# Patient Record
Sex: Female | Born: 2000 | Race: Black or African American | Hispanic: No | Marital: Single | Smoking: Never smoker
Health system: Southern US, Community
[De-identification: ages and names within clinical notes are randomized; demographics above are authoritative.]

## PROBLEM LIST (undated history)

## (undated) DIAGNOSIS — D649 Anemia, unspecified: Secondary | ICD-10-CM

## (undated) DIAGNOSIS — N390 Urinary tract infection, site not specified: Secondary | ICD-10-CM

## (undated) DIAGNOSIS — J45909 Unspecified asthma, uncomplicated: Secondary | ICD-10-CM

## (undated) DIAGNOSIS — F319 Bipolar disorder, unspecified: Secondary | ICD-10-CM

## (undated) HISTORY — DX: Urinary tract infection, site not specified: N39.0

## (undated) HISTORY — PX: FETAL AMNIOTIC BAND RELEASE: SHX1600

## (undated) HISTORY — DX: Unspecified asthma, uncomplicated: J45.909

---
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
## 2018-02-05 DIAGNOSIS — F3181 Bipolar II disorder: Secondary | ICD-10-CM | POA: Diagnosis not present

## 2018-02-12 DIAGNOSIS — F3181 Bipolar II disorder: Secondary | ICD-10-CM | POA: Diagnosis not present

## 2018-02-26 DIAGNOSIS — F3181 Bipolar II disorder: Secondary | ICD-10-CM | POA: Diagnosis not present

## 2018-03-05 DIAGNOSIS — J3081 Allergic rhinitis due to animal (cat) (dog) hair and dander: Secondary | ICD-10-CM | POA: Diagnosis not present

## 2018-03-05 DIAGNOSIS — J45990 Exercise induced bronchospasm: Secondary | ICD-10-CM | POA: Diagnosis not present

## 2018-03-05 DIAGNOSIS — J301 Allergic rhinitis due to pollen: Secondary | ICD-10-CM | POA: Diagnosis not present

## 2018-03-05 DIAGNOSIS — J3089 Other allergic rhinitis: Secondary | ICD-10-CM | POA: Diagnosis not present

## 2018-03-05 DIAGNOSIS — F3181 Bipolar II disorder: Secondary | ICD-10-CM | POA: Diagnosis not present

## 2018-03-18 DIAGNOSIS — Z113 Encounter for screening for infections with a predominantly sexual mode of transmission: Secondary | ICD-10-CM | POA: Diagnosis not present

## 2018-03-26 DIAGNOSIS — F3181 Bipolar II disorder: Secondary | ICD-10-CM | POA: Diagnosis not present

## 2018-04-24 DIAGNOSIS — F319 Bipolar disorder, unspecified: Secondary | ICD-10-CM | POA: Diagnosis not present

## 2018-05-20 ENCOUNTER — Ambulatory Visit (HOSPITAL_COMMUNITY)
Admission: EM | Admit: 2018-05-20 | Discharge: 2018-05-20 | Disposition: A | Discharge status: Home / Self Care | Payer: Medicaid Other | Attending: Family Medicine | Admitting: Family Medicine

## 2018-05-20 ENCOUNTER — Encounter (HOSPITAL_COMMUNITY): Payer: Self-pay | Admitting: Emergency Medicine

## 2018-05-20 DIAGNOSIS — R1032 Left lower quadrant pain: Secondary | ICD-10-CM | POA: Diagnosis not present

## 2018-05-20 DIAGNOSIS — N898 Other specified noninflammatory disorders of vagina: Secondary | ICD-10-CM | POA: Diagnosis not present

## 2018-05-20 DIAGNOSIS — R1031 Right lower quadrant pain: Secondary | ICD-10-CM | POA: Insufficient documentation

## 2018-05-20 DIAGNOSIS — R1084 Generalized abdominal pain: Secondary | ICD-10-CM | POA: Diagnosis present

## 2018-05-20 HISTORY — DX: Bipolar disorder, unspecified: F31.9

## 2018-05-20 LAB — POCT URINALYSIS DIP (DEVICE)
GLUCOSE, UA: NEGATIVE mg/dL
KETONES UR: 15 mg/dL — AB
Nitrite: NEGATIVE
PROTEIN: 30 mg/dL — AB
Urobilinogen, UA: 0.2 mg/dL (ref 0.0–1.0)
pH: 6 (ref 5.0–8.0)

## 2018-05-20 LAB — POCT PREGNANCY, URINE: PREG TEST UR: NEGATIVE

## 2018-05-20 MED ORDER — POLYETHYLENE GLYCOL 3350 17 G PO PACK: 17.0000 g | PACK | Freq: Every day | ORAL | 0 refills | Status: DC

## 2018-05-20 MED ORDER — METRONIDAZOLE 500 MG PO TABS: 500.0000 mg | ORAL_TABLET | Freq: Two times a day (BID) | ORAL | 0 refills | Status: DC

## 2018-05-20 NOTE — ED Provider Notes (Signed)
[facility]    CSN: [id number] Arrival date & time: 05/20/18  1857     History   Chief Complaint Chief Complaint  Patient presents with  . Abdominal Pain    HPI Rose Schwartz is a 17 y.o. female.   17 year old female comes in for few day history of generalized abdominal cramping and vaginal spotting.  States abdominal cramping is worse to the lower abdomen, was constant but waxing and waning in intensity.  However, abdominal pain has now resolved in the past hour.  Denies nausea/vomiting.  Denies fever, chills, night sweats.  Denies urinary symptoms such as frequency, dysuria, hematuria.  Has had some vaginal spotting, vaginal discharge with odor without itching.  She is sexually active with 2 female partners, no condom use.  No birth control use.  Last bowel movement today with hard stools.     Past Medical History:  Diagnosis Date  . Bipolar 1 disorder (HCC)     There are no active problems to display for this patient.   History reviewed. No pertinent surgical history.  OB History   None      Home Medications    Prior to Admission medications   Medication Sig Start Date End Date Taking? Authorizing Provider  metroNIDAZOLE (FLAGYL) 500 MG tablet Take 1 tablet (500 mg total) by mouth 2 (two) times daily. 05/20/18   Cathie HoopsYu, Lanaiya Lantry V, PA-C  polyethylene glycol (MIRALAX) packet Take 17 g by mouth daily. 05/20/18   Belinda FisherYu, Azaiah Mello V, PA-C    Family History No family history on file.  Social History Social History   Tobacco Use  . Smoking status: Never Smoker  . Smokeless tobacco: Never Used  Substance Use Topics  . Alcohol use: No  . Drug use: No     Allergies   Other   Review of Systems Review of Systems  Reason unable to perform ROS: See HPI as above.     Physical Exam Triage Vital Signs ED Triage Vitals [05/20/18 1918]  Enc Vitals Group     BP 113/76     Pulse Rate 88     Resp 16     Temp 98.3 F (36.8 C)     Temp src      SpO2 95  %     Weight      Height      Head Circumference      Peak Flow      Pain Score 1     Pain Loc      Pain Edu?      Excl. in GC?    No data found.  Updated Vital Signs BP 113/76   Pulse 88   Temp 98.3 F (36.8 C)   Resp 16   LMP 05/06/2018   SpO2 95%   Physical Exam  Constitutional: She is oriented to person, place, and time. She appears well-developed and well-nourished. No distress.  HENT:  Head: Normocephalic and atraumatic.  Eyes: Pupils are equal, round, and reactive to light. Conjunctivae are normal.  Cardiovascular: Normal rate, regular rhythm and normal heart sounds. Exam reveals no gallop and no friction rub.  No murmur heard. Pulmonary/Chest: Effort normal and breath sounds normal. She has no wheezes. She has no rales.  Abdominal: Soft. Bowel sounds are normal. She exhibits no mass. There is no tenderness. There is no rebound, no guarding and no CVA tenderness.  Neurological: She is alert and oriented to person, place, and time.  Skin: Skin is  warm and dry.  Psychiatric: She has a normal mood and affect. Her behavior is normal. Judgment normal.     UC Treatments / Results  Labs (all labs ordered are listed, but only abnormal results are displayed) Labs Reviewed  POCT URINALYSIS DIP (DEVICE) - Abnormal; Notable for the following components:      Result Value   Bilirubin Urine SMALL (*)    Ketones, ur 15 (*)    Hgb urine dipstick TRACE (*)    Protein, ur 30 (*)    Leukocytes, UA SMALL (*)    All other components within normal limits  URINE CULTURE  POCT PREGNANCY, URINE  CERVICOVAGINAL ANCILLARY ONLY    EKG None  Radiology No results found.  Procedures Procedures (including critical care time)  Medications Ordered in UC Medications - No data to display  Initial Impression / Assessment and Plan / UC Course  I have reviewed the triage vital signs and the nursing notes.  Pertinent labs & imaging results that were available during my care of the  patient were reviewed by me and considered in my medical decision making (see chart for details).    We will treat empirically for bacterial vaginitis with Flagyl.  Patient currently with abdominal pain, but given history, will treat for constipation.  Cytology sent.  Return precautions given.  Patient expresses understanding and agrees to plan.  Final Clinical Impressions(s) / UC Diagnoses   Final diagnoses:  Bilateral lower abdominal pain  Vaginal discharge    ED Prescriptions    Medication Sig Dispense Auth. Provider   metroNIDAZOLE (FLAGYL) 500 MG tablet Take 1 tablet (500 mg total) by mouth 2 (two) times daily. 14 tablet Netra Postlethwait V, PA-C   polyethylene glycol (MIRALAX) packet Take 17 g by mouth daily. [address] each Threasa Alpha, PA-C 05/20/18 1956

## 2018-05-20 NOTE — Discharge Instructions (Signed)
No alarming signs on exam. Start flagyl for possible bacterial vaginitis causing vaginal spotting. Start miralax for the next 7-14 days to help with constipation. Keep hydrated, your urine should be clear to pale yellow in color. Cytology sent, you will be contacted with any positive results that requires further treatment. Refrain from sexual activity and alcohol use for the next 7 days. If experiencing worsening abdominal pain, unable to jump/walk due to pain, nausea/vomiting, go to the emergency department for further evaluation needed.

## 2018-05-20 NOTE — ED Triage Notes (Signed)
Pt c/o generalized abdominal cramping, states shes noticed some spotting but shes not due for her period yet.

## 2018-05-21 LAB — CERVICOVAGINAL ANCILLARY ONLY
Bacterial vaginitis: POSITIVE — AB
Candida vaginitis: NEGATIVE
Chlamydia: POSITIVE — AB
Neisseria Gonorrhea: NEGATIVE
TRICH (WINDOWPATH): NEGATIVE

## 2018-05-22 ENCOUNTER — Telehealth (HOSPITAL_COMMUNITY): Payer: Self-pay | Admitting: Emergency Medicine

## 2018-05-22 MED ORDER — AZITHROMYCIN 250 MG PO TABS: 1000.0000 mg | ORAL_TABLET | Freq: Once | ORAL | 0 refills | Status: AC

## 2018-05-22 NOTE — Telephone Encounter (Signed)
Bacterial Vaginosis test is positive.  Prescription for metronidazole was given at the urgent care visit.   Chlamydia is positive.  Rx po zithromax 1g #1 dose no refills was sent to the pharmacy of record.  Pt educated to please refrain from sexual intercourse for 7 days to give the medicine time to work, sexual partners need to be notified and tested/treated.  Condoms may reduce risk of reinfection.  Recheck or followup with PCP for further evaluation if symptoms are not improving.   [facility] notified.  All patients questions answered. Pt had not started the flagyl yet, will start today.   Urine culture still pending.

## 2018-05-24 LAB — URINE CULTURE: Culture: 30000 — AB

## 2018-05-25 ENCOUNTER — Telehealth (HOSPITAL_COMMUNITY): Payer: Self-pay | Admitting: Emergency Medicine

## 2018-05-25 MED ORDER — NITROFURANTOIN MONOHYD MACRO 100 MG PO CAPS: 100.0000 mg | ORAL_CAPSULE | Freq: Two times a day (BID) | ORAL | 0 refills | Status: DC

## 2018-05-25 NOTE — Telephone Encounter (Signed)
Urine culture was positive for STAPHYLOCOCCUS SPECIES (COAGULASE NEGATIVE). Prescription for Nitrofurantion 100mg  PO BID x5 days sent to pharmacy of choice. Pt called and made aware. Pt educated to follow up if symptoms are not improving. Verbalized understanding.

## 2018-06-28 DIAGNOSIS — N898 Other specified noninflammatory disorders of vagina: Secondary | ICD-10-CM | POA: Diagnosis not present

## 2018-10-21 ENCOUNTER — Encounter (HOSPITAL_COMMUNITY): Payer: Self-pay

## 2018-10-21 ENCOUNTER — Ambulatory Visit (HOSPITAL_COMMUNITY)
Admission: EM | Admit: 2018-10-21 | Discharge: 2018-10-21 | Disposition: A | Discharge status: Home / Self Care | Payer: Medicaid Other | Attending: Family Medicine | Admitting: Family Medicine

## 2018-10-21 ENCOUNTER — Other Ambulatory Visit: Payer: Self-pay

## 2018-10-21 DIAGNOSIS — N3001 Acute cystitis with hematuria: Secondary | ICD-10-CM | POA: Insufficient documentation

## 2018-10-21 DIAGNOSIS — R109 Unspecified abdominal pain: Secondary | ICD-10-CM | POA: Diagnosis not present

## 2018-10-21 LAB — POCT URINALYSIS DIP (DEVICE)
Bilirubin Urine: NEGATIVE
Glucose, UA: NEGATIVE mg/dL
Ketones, ur: NEGATIVE mg/dL
Nitrite: POSITIVE — AB
Protein, ur: 30 mg/dL — AB
Specific Gravity, Urine: 1.025 (ref 1.005–1.030)
Urobilinogen, UA: 1 mg/dL (ref 0.0–1.0)
pH: 6.5 (ref 5.0–8.0)

## 2018-10-21 LAB — POCT PREGNANCY, URINE: Preg Test, Ur: NEGATIVE

## 2018-10-21 MED ORDER — IBUPROFEN 800 MG PO TABS: 800.0000 mg | ORAL_TABLET | Freq: Three times a day (TID) | ORAL | 0 refills | Status: DC

## 2018-10-21 MED ORDER — CEPHALEXIN 500 MG PO CAPS: 500.0000 mg | ORAL_CAPSULE | Freq: Two times a day (BID) | ORAL | 0 refills | Status: DC

## 2018-10-21 NOTE — ED Triage Notes (Signed)
Pt presents with sharp flank to lower back pain since yesterday.

## 2018-10-23 ENCOUNTER — Telehealth (HOSPITAL_COMMUNITY): Payer: Self-pay | Admitting: Emergency Medicine

## 2018-10-23 NOTE — Telephone Encounter (Signed)
Attempted to reach patient. No answer at this time.   

## 2018-10-24 LAB — URINE CULTURE: Culture: >=100000 — AB

## 2018-10-27 NOTE — ED Provider Notes (Signed)
[facility]    ASSESSMENT & PLAN:  1. Acute right flank pain   2. Acute cystitis with hematuria    Meds ordered this encounter  Medications  . cephALEXin (KEFLEX) 500 MG capsule    Sig: Take 1 capsule (500 mg total) by mouth 2 (two) times daily.    Dispense:  10 capsule    Refill:  0  . ibuprofen (ADVIL,MOTRIN) 800 MG tablet    Sig: Take 1 tablet (800 mg total) by mouth 3 (three) times daily with meals.    Dispense:  21 tablet    Refill:  0   No signs of pyelonephritis. Discussed. Urine culture sent. Will notify patient when results available. Will follow up with her PCP or here if not showing improvement over the next 48 hours, sooner if needed.  Outlined signs and symptoms indicating need for more acute intervention. Patient verbalized understanding. After Visit Summary given.  SUBJECTIVE:  Rose Schwartz is a 18 y.o. female who complains of "sharp" R flank pain and lower back; abrupt onset; yesterday. Questions mild urinary frequency without dysuria. Without associated fever, chills, genitourinary discharge or gross bleeding. Hematuria: not present. Normal PO intake. Without specific abdominal pain. No self treatment reported. Ambulatory without difficulty. No specific aggravating or alleviating factors reported.  LMP: Patient's last menstrual period was 09/29/2018.  ROS: As in HPI. All other systems negative.   OBJECTIVE:  Vitals:   10/21/18 1850  BP: 109/75  Pulse: 95  Resp: 18  Temp: 98.7 F (37.1 C)  TempSrc: Oral  SpO2: 99%   General appearance: alert; no distress HENT: oropharynx: moist Lungs: unlabored respirations Abdomen: soft, non-tender; bowel sounds normal; no masses or organomegaly; no guarding or rebound tenderness Back: mild R CVA tenderness; no midline tenderness; FROM at waist Extremities: no edema; symmetrical with no gross deformities Skin: warm and dry Neurologic: normal gait Psychological: alert and cooperative;  normal mood and affect  Labs Reviewed  URINE CULTURE  POCT URINALYSIS DIP (DEVICE) - Abnormal; Notable for the following components:   Hgb urine dipstick MODERATE (*)    Protein, ur 30 (*)    Nitrite POSITIVE (*)    Leukocytes,Ua TRACE (*)    All other components within normal limits  POC URINE PREG, ED  POCT PREGNANCY, URINE    Allergies  Allergen Reactions  . Other     Tree nuts    Past Medical History:  Diagnosis Date  . Bipolar 1 disorder (HCC)    Social History   Socioeconomic History  . Marital status: Single    Spouse name: Not on file  . Number of children: Not on file  . Years of education: Not on file  . Highest education level: Not on file  Occupational History  . Not on file  Social Needs  . Financial resource strain: Not on file  . Food insecurity:    Worry: Not on file    Inability: Not on file  . Transportation needs:    Medical: Not on file    Non-medical: Not on file  Tobacco Use  . Smoking status: Never Smoker  . Smokeless tobacco: Never Used  Substance and Sexual Activity  . Alcohol use: No  . Drug use: No  . Sexual activity: Not on file  Lifestyle  . Physical activity:    Days per week: Not on file    Minutes per session: Not on file  . Stress: Not on file  Relationships  . Social connections:  Talks on phone: Not on file    Gets together: Not on file    Attends religious service: Not on file    Active member of club or organization: Not on file    Attends meetings of clubs or organizations: Not on file    Relationship status: Not on file  . Intimate partner violence:    Fear of current or ex partner: Not on file    Emotionally abused: Not on file    Physically abused: Not on file    Forced sexual activity: Not on file  Other Topics Concern  . Not on file  Social History Narrative  . Not on file   History reviewed. No pertinent family history.     Mardella Layman, MD 11/04/18 [phone]

## 2018-12-18 ENCOUNTER — Encounter (HOSPITAL_COMMUNITY): Payer: Self-pay | Admitting: Emergency Medicine

## 2018-12-18 ENCOUNTER — Ambulatory Visit (HOSPITAL_COMMUNITY)
Admission: EM | Admit: 2018-12-18 | Discharge: 2018-12-18 | Disposition: A | Discharge status: Home / Self Care | Payer: Medicaid Other | Attending: Family Medicine | Admitting: Family Medicine

## 2018-12-18 ENCOUNTER — Other Ambulatory Visit: Payer: Self-pay

## 2018-12-18 DIAGNOSIS — R0789 Other chest pain: Secondary | ICD-10-CM | POA: Diagnosis not present

## 2018-12-18 MED ORDER — ALBUTEROL SULFATE HFA 108 (90 BASE) MCG/ACT IN AERS: 2.0000 | INHALATION_SPRAY | RESPIRATORY_TRACT | 1 refills | Status: AC | PRN

## 2018-12-18 NOTE — ED Triage Notes (Signed)
Pt here with CP worse with inspiration

## 2018-12-18 NOTE — Discharge Instructions (Addendum)
Your symptoms are most consistent with reactive airways, or asthma.  If the symptoms return, try to use the inhaler.  If the inhaler fails to clear the symptoms promptly, you can return here for further evaluation.  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

## 2018-12-18 NOTE — ED Provider Notes (Addendum)
[facility]    CSN: [id number] Arrival date & time: 12/18/18  1856     History   Chief Complaint Chief Complaint  Patient presents with  . Chest Pain    HPI Rose Schwartz is a 18 y.o. female.   18 [facility] here for evaluation of chest pain.  She developed chest tightness this afternoon about 3 hours PTA.  The symptoms have resolved.  She had these symptoms a couple weeks ago and they resolved in a couple hours.  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  Patient has a h/o asthma but did not use her inhaler at the onset of symptoms.  No cough or shortness of breath, no fever or loss of sense of smell, no GI symptoms.       Past Medical History:  Diagnosis Date  . Bipolar 1 disorder (HCC)     There are no active problems to display for this patient.   History reviewed. No pertinent surgical history.  OB History   No obstetric history on file.      Home Medications    Prior to Admission medications   Medication Sig Start Date End Date Taking? Authorizing Provider  albuterol (VENTOLIN HFA) 108 (90 Base) MCG/ACT inhaler Inhale 2 puffs into the lungs every 4 (four) hours as needed for wheezing or shortness of breath (cough, shortness of breath or wheezing.). 12/18/18   Elvina SidleLauenstein, Leocadio Heal, MD  ibuprofen (ADVIL,MOTRIN) 800 MG tablet Take 1 tablet (800 mg total) by mouth 3 (three) times daily with meals. 10/21/18   Mardella LaymanHagler, Brian, MD  polyethylene glycol [facility](MIRALAX) packet Take 17 g by mouth daily. 05/20/18   Belinda FisherYu, Amy V, PA-C    Family History History reviewed. No pertinent family history.  Social History Social History   Tobacco Use  . Smoking status: Never Smoker  . Smokeless tobacco: Never Used  Substance Use Topics  . Alcohol use: No  . Drug use: No     Allergies   Other   Review of Systems Review of Systems  Respiratory: Positive for chest tightness.   All other systems reviewed and are  negative.    Physical Exam Triage Vital Signs ED Triage Vitals [12/18/18 1906]  Enc Vitals Group     BP 108/75     Pulse Rate 91     Resp 18     Temp 98.8 F (37.1 C)     Temp Source Oral     SpO2 98 %     Weight      Height      Head Circumference      Peak Flow      Pain Score 5     Pain Loc      Pain Edu?      Excl. in GC?    No data found.  Updated Vital Signs BP 108/75 (BP Location: Right Arm)   Pulse 91   Temp 98.8 F (37.1 C) (Oral)   Resp 18   SpO2 98%    Physical Exam Vitals signs and nursing note reviewed.  Constitutional:      Appearance: She is well-developed and normal weight.  Neck:     Musculoskeletal: Normal range of motion and neck supple.  Cardiovascular:     Heart sounds: Normal heart sounds.  Pulmonary:     Effort: Pulmonary effort is normal.     Breath sounds: Normal breath sounds.  Musculoskeletal: Normal range of motion.  Skin:    General: Skin is warm and dry.  Neurological:     General: No focal deficit present.     Mental Status: She is alert.  Psychiatric:        Mood and Affect: Mood normal.      UC Treatments / Results  Labs (all labs ordered are listed, but only abnormal results are displayed) Labs Reviewed - No data to display  EKG None  Radiology No results found.  Procedures Procedures (including critical care time)  Medications Ordered in UC Medications - No data to display  Initial Impression / Assessment and Plan / UC Course  I have reviewed the triage vital signs and the nursing notes.  Pertinent labs & imaging results that were available during my care of the patient were reviewed by me and considered in my medical decision making (see chart for details).    Final Clinical Impressions(s) / UC Diagnoses   Final diagnoses:  Chest tightness     Discharge Instructions     Your symptoms are most consistent with reactive airways, or asthma.  If the symptoms return, try to use the inhaler.  If the  inhaler fails to clear the symptoms promptly, you can return here for further evaluation.  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    ED Prescriptions    Medication Sig Dispense Auth. Provider   albuterol (VENTOLIN HFA) 108 (90 Base) MCG/ACT inhaler Inhale 2 puffs into the lungs every 4 (four) hours as needed for wheezing or shortness of breath (cough, shortness of breath or wheezing.). [place], MD     Controlled Substance Prescriptions [place] Controlled Substance Registry consulted? Not Applicable   Rose Haber, MD 12/18/18 Derl Barrow, MD 12/18/18 [phone]

## 2019-01-18 ENCOUNTER — Ambulatory Visit (HOSPITAL_COMMUNITY)
Admission: EM | Admit: 2019-01-18 | Discharge: 2019-01-18 | Disposition: A | Discharge status: Home / Self Care | Payer: Medicaid Other | Attending: Urgent Care | Admitting: Urgent Care

## 2019-01-18 ENCOUNTER — Encounter (HOSPITAL_COMMUNITY): Payer: Self-pay

## 2019-01-18 ENCOUNTER — Other Ambulatory Visit: Payer: Self-pay

## 2019-01-18 DIAGNOSIS — K529 Noninfective gastroenteritis and colitis, unspecified: Secondary | ICD-10-CM | POA: Diagnosis not present

## 2019-01-18 DIAGNOSIS — R110 Nausea: Secondary | ICD-10-CM

## 2019-01-18 DIAGNOSIS — R0789 Other chest pain: Secondary | ICD-10-CM

## 2019-01-18 DIAGNOSIS — R197 Diarrhea, unspecified: Secondary | ICD-10-CM

## 2019-01-18 MED ORDER — LOPERAMIDE HCL 2 MG PO CAPS: 2.0000 mg | ORAL_CAPSULE | Freq: Every day | ORAL | 0 refills | Status: DC | PRN

## 2019-01-18 MED ORDER — ONDANSETRON 8 MG PO TBDP: 8.0000 mg | ORAL_TABLET | Freq: Three times a day (TID) | ORAL | 0 refills | Status: DC | PRN

## 2019-01-18 NOTE — ED Triage Notes (Signed)
Pt presents with central chest pain with some nausea today.

## 2019-01-18 NOTE — ED Provider Notes (Addendum)
MRN: [id number] DOB: [birth year]  Subjective:   Rose Schwartz is a 18 y.o. female presenting for acute onset today of mild to moderate epigastric/lower mid chest pain with associated nausea, diarrhea.  Patient reports that she ate sushi over the weekend and thinks she had food poisoning.  Feels queasy on her stomach.  Has not tried medications for relief.  Denies taking chronic medications.    Allergies  Allergen Reactions  . Other     Tree nuts    Past Medical History:  Diagnosis Date  . Bipolar 1 disorder (HCC)      History reviewed. No pertinent surgical history.  Review of Systems  Constitutional: Negative for fever and malaise/fatigue.  HENT: Negative for congestion, ear pain, sinus pain and sore throat.   Eyes: Negative for blurred vision, double vision, discharge and redness.  Respiratory: Negative for cough, hemoptysis, shortness of breath and wheezing.   Cardiovascular: Positive for chest pain.  Gastrointestinal: Positive for abdominal pain, diarrhea and nausea. Negative for blood in stool, constipation and vomiting.  Genitourinary: Negative for dysuria, flank pain and hematuria.  Musculoskeletal: Negative for myalgias.  Skin: Negative for rash.  Neurological: Negative for dizziness, weakness and headaches.  Psychiatric/Behavioral: Negative for depression and substance abuse.    Objective:   Vitals: BP 115/78 (BP Location: Left Arm)   Pulse 90   Temp 99.4 F (37.4 C) (Oral)   Resp 17   LMP 12/22/2018   SpO2 99%   Physical Exam Constitutional:      General: She is not in acute distress.    Appearance: Normal appearance. She is well-developed and normal weight. She is not ill-appearing, toxic-appearing or diaphoretic.  HENT:     Head: Normocephalic and atraumatic.     Right Ear: External ear normal.     Left Ear: External ear normal.     Nose: Nose normal.     Mouth/Throat:     Mouth: Mucous membranes are moist.     Pharynx: Oropharynx is clear.   Eyes:     General: No scleral icterus.    Extraocular Movements: Extraocular movements intact.     Pupils: Pupils are equal, round, and reactive to light.  Cardiovascular:     Rate and Rhythm: Normal rate and regular rhythm.     Heart sounds: Normal heart sounds. No murmur. No friction rub. No gallop.   Pulmonary:     Effort: Pulmonary effort is normal. No respiratory distress.     Breath sounds: Normal breath sounds. No stridor. No wheezing, rhonchi or rales.  Abdominal:     General: Bowel sounds are normal. There is no distension.     Palpations: Abdomen is soft. There is no mass.     Tenderness: There is generalized abdominal tenderness and tenderness in the epigastric area and left upper quadrant. There is no right CVA tenderness, left CVA tenderness, guarding or rebound. Negative signs include Murphy's sign and McBurney's sign.  Skin:    General: Skin is warm and dry.     Coloration: Skin is not pale.     Findings: No rash.  Neurological:     General: No focal deficit present.     Mental Status: She is alert and oriented to person, place, and time.  Psychiatric:        Mood and Affect: Mood normal.        Behavior: Behavior normal.        Thought Content: Thought content normal.  Judgment: Judgment normal.     Assessment and Plan :   1. Gastroenteritis   2. Nausea   3. Diarrhea, unspecified type   4. Atypical chest pain     Patient has minimal risk factors for acute cardiovascular event and vital signs, physical exam findings are very reassuring.  We will manage for gastroenteritis with supportive care.  Recommended patient hydrate well, eat light meals and maintain electrolytes.  Will use Zofran and Imodium for nausea, vomiting and diarrhea. Counseled patient on potential for adverse effects with medications prescribed/recommended today, ER and return-to-clinic precautions discussed, patient verbalized understanding.    Jaynee Eagles, PA-C 01/19/19 1007

## 2019-01-25 ENCOUNTER — Telehealth (HOSPITAL_COMMUNITY): Payer: Self-pay | Admitting: Emergency Medicine

## 2019-01-25 NOTE — Telephone Encounter (Signed)
Pt called stating she never received a call to go get tested for covid. Per Jaynee Eagles PA, okay to order the labwork to send her to testing center. Pt given information, test ordered.

## 2019-03-05 DIAGNOSIS — J301 Allergic rhinitis due to pollen: Secondary | ICD-10-CM | POA: Diagnosis not present

## 2019-03-05 DIAGNOSIS — J3089 Other allergic rhinitis: Secondary | ICD-10-CM | POA: Diagnosis not present

## 2019-03-05 DIAGNOSIS — Z91018 Allergy to other foods: Secondary | ICD-10-CM | POA: Diagnosis not present

## 2019-03-05 DIAGNOSIS — J3081 Allergic rhinitis due to animal (cat) (dog) hair and dander: Secondary | ICD-10-CM | POA: Diagnosis not present

## 2019-05-07 ENCOUNTER — Other Ambulatory Visit: Payer: Self-pay

## 2019-05-07 DIAGNOSIS — Z20822 Contact with and (suspected) exposure to covid-19: Secondary | ICD-10-CM

## 2019-05-07 DIAGNOSIS — Z20828 Contact with and (suspected) exposure to other viral communicable diseases: Secondary | ICD-10-CM | POA: Diagnosis not present

## 2019-05-08 LAB — NOVEL CORONAVIRUS, NAA: SARS-CoV-2, NAA: NOT DETECTED

## 2019-05-24 ENCOUNTER — Inpatient Hospital Stay (HOSPITAL_COMMUNITY): Payer: Medicaid Other

## 2019-05-24 ENCOUNTER — Ambulatory Visit (HOSPITAL_COMMUNITY)
Admission: EM | Admit: 2019-05-24 | Discharge: 2019-05-24 | Disposition: A | Discharge status: Home / Self Care | Payer: Medicaid Other | Attending: Internal Medicine | Admitting: Internal Medicine

## 2019-05-24 ENCOUNTER — Encounter (HOSPITAL_COMMUNITY): Payer: Self-pay

## 2019-05-24 ENCOUNTER — Encounter (HOSPITAL_COMMUNITY): Payer: Self-pay | Admitting: *Deleted

## 2019-05-24 ENCOUNTER — Other Ambulatory Visit: Payer: Self-pay

## 2019-05-24 ENCOUNTER — Inpatient Hospital Stay (HOSPITAL_COMMUNITY)
Admission: AD | Admit: 2019-05-24 | Discharge: 2019-05-24 | Disposition: A | Discharge status: Home / Self Care | Payer: Medicaid Other | Attending: Obstetrics and Gynecology | Admitting: Obstetrics and Gynecology

## 2019-05-24 DIAGNOSIS — O99891 Other specified diseases and conditions complicating pregnancy: Secondary | ICD-10-CM | POA: Diagnosis not present

## 2019-05-24 DIAGNOSIS — R109 Unspecified abdominal pain: Secondary | ICD-10-CM

## 2019-05-24 DIAGNOSIS — O99341 Other mental disorders complicating pregnancy, first trimester: Secondary | ICD-10-CM | POA: Insufficient documentation

## 2019-05-24 DIAGNOSIS — Z91048 Other nonmedicinal substance allergy status: Secondary | ICD-10-CM | POA: Diagnosis not present

## 2019-05-24 DIAGNOSIS — Z3201 Encounter for pregnancy test, result positive: Secondary | ICD-10-CM

## 2019-05-24 DIAGNOSIS — O3680X0 Pregnancy with inconclusive fetal viability, not applicable or unspecified: Secondary | ICD-10-CM | POA: Diagnosis not present

## 2019-05-24 DIAGNOSIS — Z3A00 Weeks of gestation of pregnancy not specified: Secondary | ICD-10-CM | POA: Diagnosis not present

## 2019-05-24 DIAGNOSIS — O26891 Other specified pregnancy related conditions, first trimester: Secondary | ICD-10-CM

## 2019-05-24 DIAGNOSIS — F319 Bipolar disorder, unspecified: Secondary | ICD-10-CM | POA: Insufficient documentation

## 2019-05-24 DIAGNOSIS — R102 Pelvic and perineal pain: Secondary | ICD-10-CM | POA: Diagnosis not present

## 2019-05-24 DIAGNOSIS — O26899 Other specified pregnancy related conditions, unspecified trimester: Secondary | ICD-10-CM | POA: Diagnosis not present

## 2019-05-24 DIAGNOSIS — Z3A01 Less than 8 weeks gestation of pregnancy: Secondary | ICD-10-CM | POA: Diagnosis not present

## 2019-05-24 LAB — CBC
HCT: 38.8 % (ref 36.0–46.0)
Hemoglobin: 12.9 g/dL (ref 12.0–15.0)
MCH: 27.7 pg (ref 26.0–34.0)
MCHC: 33.2 g/dL (ref 30.0–36.0)
MCV: 83.4 fL (ref 80.0–100.0)
Platelets: 295 10*3/uL (ref 150–400)
RBC: 4.65 MIL/uL (ref 3.87–5.11)
RDW: 13 % (ref 11.5–15.5)
WBC: 9 10*3/uL (ref 4.0–10.5)
nRBC: 0 % (ref 0.0–0.2)

## 2019-05-24 LAB — POCT URINALYSIS DIP (DEVICE)
Bilirubin Urine: NEGATIVE
Glucose, UA: NEGATIVE mg/dL
Ketones, ur: 15 mg/dL — AB
Leukocytes,Ua: NEGATIVE
Nitrite: NEGATIVE
Protein, ur: NEGATIVE mg/dL
Specific Gravity, Urine: 1.025 (ref 1.005–1.030)
Urobilinogen, UA: 0.2 mg/dL (ref 0.0–1.0)
pH: 7 (ref 5.0–8.0)

## 2019-05-24 LAB — WET PREP, GENITAL
Sperm: NONE SEEN
Trich, Wet Prep: NONE SEEN
Yeast Wet Prep HPF POC: NONE SEEN

## 2019-05-24 LAB — POCT PREGNANCY, URINE: Preg Test, Ur: POSITIVE — AB

## 2019-05-24 LAB — ABO/RH: ABO/RH(D): O POS

## 2019-05-24 LAB — HCG, QUANTITATIVE, PREGNANCY: hCG, Beta Chain, Quant, S: 335 m[IU]/mL — ABNORMAL HIGH (ref <5)

## 2019-05-24 NOTE — MAU Provider Note (Signed)
History     CSN: [id number]  Arrival date and time: 05/24/19 2008   First Provider Initiated Contact with Patient 05/24/19 2251      Chief Complaint  Patient presents with  . Abdominal Pain   Rose Schwartz is a 18 y.o. G1P0 at [redacted]w[redacted]d who presents to MAU with complaints of abdominal pain. She reports abdominal pain started occurring 3 days ago, describes it as sharp cramping pain in the lower abdomen that radiates up to umbilicus, rates pain 4/10- has not taken any medication. She denies vaginal bleeding and discharge. She reports occasional nausea but denies vomiting.    OB History    Gravida  1   Para      Term      Preterm      AB      Living        SAB      TAB      Ectopic      Multiple      Live Births              Past Medical History:  Diagnosis Date  . Bipolar 1 disorder [facility])     Past Surgical History:  Procedure Laterality Date  . FETAL AMNIOTIC BAND RELEASE      History reviewed. No pertinent family history.  Social History   Tobacco Use  . Smoking status: Never Smoker  . Smokeless tobacco: Never Used  Substance Use Topics  . Alcohol use: No    Comment: SOCIAL  . Drug use: No    Allergies:  Allergies  Allergen Reactions  . Other     Tree nuts    Medications Prior to Admission  Medication Sig Dispense Refill Last Dose  . albuterol (VENTOLIN HFA) 108 (90 Base) MCG/ACT inhaler Inhale 2 puffs into the lungs every 4 (four) hours as needed for wheezing or shortness of breath (cough, shortness of breath or wheezing.). 1 Inhaler 1 Past Week at Unknown time  . ibuprofen (ADVIL,MOTRIN) 800 MG tablet Take 1 tablet (800 mg total) by mouth 3 (three) times daily with meals. 21 tablet 0 Past Month at Unknown time  . EPINEPHrine 0.3 mg/0.3 mL IJ SOAJ injection AS DIRECTED AS NEEDED FOR SYSTEMIC REACTIONS INJECTION     . loperamide (IMODIUM) 2 MG capsule Take 1 capsule (2 mg total) by mouth daily as needed for diarrhea or loose  stools. 10 capsule 0 More than a month at Unknown time  . ondansetron (ZOFRAN-ODT) 8 MG disintegrating tablet Take 1 tablet (8 mg total) by mouth every 8 (eight) hours as needed for nausea. 20 tablet 0 More than a month at Unknown time  . polyethylene glycol (MIRALAX) packet Take 17 g by mouth daily. 14 each 0 More than a month at Unknown time    Review of Systems  Constitutional: Negative.   Respiratory: Negative.   Cardiovascular: Negative.   Gastrointestinal: Positive for abdominal pain. Negative for constipation, diarrhea, nausea and vomiting.  Genitourinary: Negative.   Musculoskeletal: Negative.   Neurological: Negative.    Physical Exam   Last menstrual period 04/22/2019.  Physical Exam  Nursing note and vitals reviewed. Constitutional: She is oriented to person, place, and time. She appears well-developed and well-nourished. No distress.  Cardiovascular: Normal rate and regular rhythm.  Respiratory: Effort normal and breath sounds normal. No respiratory distress. She has no wheezes.  GI: Soft. She exhibits no distension. There is no abdominal tenderness. There is no rebound and no guarding.  Genitourinary:    Genitourinary Comments: Deferred, blind swabs collected   Neurological: She is alert and oriented to person, place, and time.  Psychiatric: She has a normal mood and affect. Her behavior is normal. Thought content normal.    MAU Course  Procedures  MDM Orders Placed This Encounter  Procedures  . Wet prep, genital  . US OB LESS THAN 14 WEEKS WITH OB TRANSVAGINAL  . CBC  . hCG, quantitative, pregnancy  . ABO/Rh   Labs and US report reviewed:  Results for orders placed or performed during the hospital encounter of 05/24/19 (from the past 24 hour(s))  CBC     Status: None   Collection Time: 05/24/19  8:36 PM  Result Value Ref Range   WBC 9.0 4.0 - 10.5 K/uL   RBC 4.65 3.87 - 5.11 MIL/uL   Hemoglobin 12.9 12.0 - 15.0 g/dL   HCT 16.138.8 09.636.0 - 04.546.0 %   MCV 83.4  80.0 - 100.0 fL   MCH 27.7 26.0 - 34.0 pg   MCHC 33.2 30.0 - 36.0 g/dL   RDW 40.913.0 81.111.5 - 91.415.5 %   Platelets 295 150 - 400 K/uL   nRBC 0.0 0.0 - 0.2 %  ABO/Rh     Status: None   Collection Time: 05/24/19  8:36 PM  Result Value Ref Range   ABO/RH(D) O POS    No rh immune globuloin      NOT A RH IMMUNE GLOBULIN CANDIDATE, PT RH POSITIVE Performed at [facility], [address] [place], [place] [address]   hCG, quantitative, pregnancy     Status: Abnormal   Collection Time: 05/24/19  8:36 PM  Result Value Ref Range   hCG, Beta Chain, Quant, S 335 (H) <5 mIU/mL  Wet prep, genital     Status: Abnormal   Collection Time: 05/24/19  9:00 PM  Result Value Ref Range   Yeast Wet Prep HPF POC NONE SEEN NONE SEEN   Trich, Wet Prep NONE SEEN NONE SEEN   Clue Cells Wet Prep HPF POC PRESENT (A) NONE SEEN   WBC, Wet Prep HPF POC MANY (A) NONE SEEN   Sperm NONE SEEN    [place] Ob Less Than 14 Weeks With Ob Transvaginal  Result Date: 05/24/2019 CLINICAL DATA:  Severe cramps EXAM: OBSTETRIC <14 WK US AND TRANSVAGINAL OB US TECHNIQUE: Both transabdominal and transvaginal ultrasound examinations were performed for complete evaluation of the gestation as well as the maternal uterus, adnexal regions, and pelvic cul-de-sac. Transvaginal technique was performed to assess early pregnancy. COMPARISON:  None. FINDINGS: Intrauterine gestational sac: None Yolk sac:  Not visualized Embryo:  Not visualized Cardiac Activity: Heart Rate:   bpm MSD:   mm    w     d CRL:    mm    w    d                  US EDC: Subchorionic hemorrhage:  N/A Maternal uterus/adnexae: Endometrium 11 mm in thickness. No adnexal mass or free fluid. IMPRESSION: No intrauterine pregnancy visualized. Differential considerations would include early intrauterine pregnancy too early to visualize, spontaneous abortion, or occult ectopic pregnancy. Recommend close clinical followup and serial quantitative beta HCGs and ultrasounds. Electronically  Signed   By: Charlett NoseKevin  Dover M.D.   On: 05/24/2019 22:15   Discussed results of US and labs with patient. Discussed pregnancy of unknown location based on US completed today and importance of follow up HCG then [place]  based on follow up HCG. Stat HCG scheduled for Thursday morning- patient aware of time and location. Ectopic precautions given to patient and discussed reasons to return to MAU.   Patient is unsure at this time of her options as this pregnancy was not planned. Discussed with patient that other options are only available if IUP- patient verbalizes understanding. Discussed with patient possibility of MTX if ectopic present.   Discussed reasons to return to MAU. Follow up as scheduled in the office on Thursday. Return to MAU as needed. Ectopic and miscarriage precautions. Pt stable at time of discharge.   Assessment and Plan   1. Pregnancy of unknown anatomic location   2. Abdominal pain during pregnancy in first trimester    Discharge home Follow up as scheduled in the office for stat HCG Return to MAU as needed for reasons discussed and/or emergencies  Ectopic and miscarriage precautions discussed   [place] for [facility] Follow up.   Specialty: Obstetrics and Gynecology Why: Follow up on Thursday for repeat labs  Contact information: [address], [place] [id number] [place] [id number] [phone]         Allergies as of 05/24/2019      Reactions   Other    Tree nuts      Medication List    TAKE these medications   albuterol 108 (90 Base) MCG/ACT inhaler Commonly known as: VENTOLIN HFA Inhale 2 puffs into the lungs every 4 (four) hours as needed for wheezing or shortness of breath (cough, shortness of breath or wheezing.).   EPINEPHrine 0.3 mg/0.3 mL Soaj injection Commonly known as: EPI-PEN AS DIRECTED AS NEEDED FOR SYSTEMIC REACTIONS INJECTION   ibuprofen 800 MG  tablet Commonly known as: ADVIL Take 1 tablet (800 mg total) by mouth 3 (three) times daily with meals.   loperamide 2 MG capsule Commonly known as: IMODIUM Take 1 capsule (2 mg total) by mouth daily as needed for diarrhea or loose stools.   ondansetron 8 MG disintegrating tablet Commonly known as: ZOFRAN-ODT Take 1 tablet (8 mg total) by mouth every 8 (eight) hours as needed for nausea.   polyethylene glycol 17 g packet Commonly known as: MiraLax Take 17 g by mouth daily.       Lajean Manes 05/24/2019, 11:01 PM

## 2019-05-24 NOTE — ED Provider Notes (Signed)
[place]    CSN: [id number] Arrival date & time: 05/24/19  1850      History   Chief Complaint Chief Complaint  Patient presents with  . Abdominal Pain  . Nausea  . Back Pain    HPI Rose Schwartz is a 18 y.o. female history of bipolar type I disorder presenting today for evaluation of abdominal pain and back pain.  Patient states that symptoms began approximately 3 days ago.  Pain is described as a cramping sensation.  She also notes that she has had some nausea, but denies vomiting.  Denies any change in bowel movements.  She has felt some tenderness within her left chest and feels like her breasts have been tender.  States that she feels as if she is having PMS symptoms felt worse.  Last menstrual cycle was around 10/15.  Is not on any form of birth control.  HPI  Past Medical History:  Diagnosis Date  . Bipolar 1 disorder ([place])     There are no active problems to display for this patient.   Past Surgical History:  Procedure Laterality Date  . FETAL AMNIOTIC BAND RELEASE      OB History    Gravida  1   Para      Term      Preterm      AB      Living        SAB      TAB      Ectopic      Multiple      Live Births               Home Medications    Prior to Admission medications   Medication Sig Start Date End Date Taking? Authorizing Provider  albuterol (VENTOLIN HFA) 108 (90 Base) MCG/ACT inhaler Inhale 2 puffs into the lungs every 4 (four) hours as needed for wheezing or shortness of breath (cough, shortness of breath or wheezing.). 12/18/18   Robyn Haber, MD  EPINEPHrine 0.3 mg/0.3 mL IJ SOAJ injection AS DIRECTED AS NEEDED FOR SYSTEMIC REACTIONS INJECTION 03/08/19   [provider]  ibuprofen (ADVIL,MOTRIN) 800 MG tablet Take 1 tablet (800 mg total) by mouth 3 (three) times daily with meals. 10/21/18   Vanessa Kick, MD  loperamide (IMODIUM) 2 MG capsule Take 1 capsule (2 mg total) by mouth daily as needed  for diarrhea or loose stools. 01/18/19   Jaynee Eagles, PA-C  ondansetron (ZOFRAN-ODT) 8 MG disintegrating tablet Take 1 tablet (8 mg total) by mouth every 8 (eight) hours as needed for nausea. 01/18/19   Jaynee Eagles, PA-C  polyethylene glycol [facility]) packet Take 17 g by mouth daily. 05/20/18   Ok Edwards, PA-C    Family History History reviewed. No pertinent family history.  Social History Social History   Tobacco Use  . Smoking status: Never Smoker  . Smokeless tobacco: Never Used  Substance Use Topics  . Alcohol use: No    Comment: SOCIAL  . Drug use: No     Allergies   Other   Review of Systems Review of Systems  Constitutional: Negative for fever.  Respiratory: Negative for shortness of breath.   Cardiovascular: Negative for chest pain.  Gastrointestinal: Positive for abdominal pain and nausea. Negative for diarrhea and vomiting.  Genitourinary: Negative for dysuria, flank pain, genital sores, hematuria, menstrual problem, vaginal bleeding, vaginal discharge and vaginal pain.  Musculoskeletal: Negative for back pain.  Skin: Negative for rash.  Neurological: Negative for dizziness, light-headedness and headaches.     Physical Exam Triage Vital Signs ED Triage Vitals  Enc Vitals Group     BP 05/24/19 1931 130/78     Pulse Rate 05/24/19 1931 85     Resp 05/24/19 1931 16     Temp 05/24/19 1931 98.6 F (37 C)     Temp Source 05/24/19 1931 Oral     SpO2 05/24/19 1931 98 %     Weight --      Height --      Head Circumference --      Peak Flow --      Pain Score 05/24/19 1928 8     Pain Loc --      Pain Edu? --      Excl. in GC? --    No data found.  Updated Vital Signs BP 130/78 (BP Location: Left Arm)   Pulse 85   Temp 98.6 F (37 C) (Oral)   Resp 16   LMP 04/22/2019   SpO2 98%   Visual Acuity Right Eye Distance:   Left Eye Distance:   Bilateral Distance:    Right Eye Near:   Left Eye Near:    Bilateral Near:     Physical Exam Vitals signs  and nursing note reviewed.  Constitutional:      General: She is not in acute distress.    Appearance: She is well-developed.  HENT:     Head: Normocephalic and atraumatic.  Eyes:     Conjunctiva/sclera: Conjunctivae normal.  Neck:     Musculoskeletal: Neck supple.  Cardiovascular:     Rate and Rhythm: Normal rate and regular rhythm.     Heart sounds: No murmur.  Pulmonary:     Effort: Pulmonary effort is normal. No respiratory distress.     Breath sounds: Normal breath sounds.  Abdominal:     Palpations: Abdomen is soft.     Tenderness: There is abdominal tenderness.     Comments: Abdomen soft, nondistended, generalized tenderness throughout abdomen which caused worsening cramping and pain after palpation, no focal tenderness, negative rebound  Skin:    General: Skin is warm and dry.  Neurological:     Mental Status: She is alert.      UC Treatments / Results  Labs (all labs ordered are listed, but only abnormal results are displayed) Labs Reviewed  POCT URINALYSIS DIP (DEVICE) - Abnormal; Notable for the following components:      Result Value   Ketones, ur 15 (*)    Hgb urine dipstick TRACE (*)    All other components within normal limits  POCT PREGNANCY, URINE - Abnormal; Notable for the following components:   Preg Test, Ur POSITIVE (*)    All other components within normal limits    EKG   Radiology No results found.  Procedures Procedures (including critical care time)  Medications Ordered in UC Medications - No data to display  Initial Impression / Assessment and Plan / UC Course  I have reviewed the triage vital signs and the nursing notes.  Pertinent labs & imaging results that were available during my care of the patient were reviewed by me and considered in my medical decision making (see chart for details).     Pregnancy test positive.  Discussed this with patient.  Given associated abdominal pain [facility] to  rule out ectopic.  Discussed other recommendations of initiating prenatal vitamin and following up with women's outpatient clinic  for further management of pregnancy after evaluation of abdominal pain tonight at MAU.  Patient verbalized understanding and verbalized that she plans to go to [facility].  Stable on discharge, sent independently. Final Clinical Impressions(s) / UC Diagnoses   Final diagnoses:  Abdominal pain, unspecified abdominal location     Discharge Instructions     Pregnancy test positive Please follow up at [facility] hospital with abdominal pain    ED Prescriptions    None     PDMP not reviewed this encounter.   Lew Dawes, [place] 05/24/19 2108

## 2019-05-24 NOTE — ED Triage Notes (Signed)
Pt states having abdominal pain and back pain x 3 days. Pt states having nausea x 2 days

## 2019-05-24 NOTE — MAU Note (Signed)
PT SAYS  HAS ABD PAIN -  STARTED  SAT.  THEN WENT TO [place] TONIGHT . PT DID HPT  - POSITIVE. NO BC. LAST SEX- WED. NO BLEEDING.

## 2019-05-24 NOTE — Discharge Instructions (Signed)
Pregnancy test positive Please follow up at [facility] hospital with abdominal pain

## 2019-05-25 LAB — GC/CHLAMYDIA PROBE AMP (~~LOC~~) NOT AT ARMC
Chlamydia: NEGATIVE
Comment: NEGATIVE
Comment: NORMAL
Neisseria Gonorrhea: NEGATIVE

## 2019-05-26 ENCOUNTER — Telehealth: Payer: Self-pay | Admitting: Family Medicine

## 2019-05-26 NOTE — Telephone Encounter (Signed)
Attempted to call patient about her appointment on 11/19 @ 8:00. No answer left voicemail instructing patient to wear a face mask for the entire appointment and no visitors are allowed during the visit. Patient instructed not to attend the appointment if she was any symptoms. Symptom list and office number left.

## 2019-05-27 ENCOUNTER — Ambulatory Visit (INDEPENDENT_AMBULATORY_CARE_PROVIDER_SITE_OTHER): Payer: Medicaid Other | Admitting: General Practice

## 2019-05-27 ENCOUNTER — Other Ambulatory Visit: Payer: Self-pay

## 2019-05-27 DIAGNOSIS — O3680X0 Pregnancy with inconclusive fetal viability, not applicable or unspecified: Secondary | ICD-10-CM | POA: Diagnosis not present

## 2019-05-27 LAB — BETA HCG QUANT (REF LAB): hCG Quant: 1307 m[IU]/mL

## 2019-05-27 NOTE — Progress Notes (Signed)
Patient presents to office today for stat bhcg following recent MAU visit on 11/16. Patient reports improvement in pain since then, states it feels like mild menstrual cramps & rates pain at a 4. Denies bleeding. Discussed with patient we are monitoring your bhcg levels today, results will be reviewed with a provider, & we will call you in approximately 2 hours with results/updated plan of care. Patient verbalized understanding & provided call back number [phone].  Reviewed with Dr Kennon Rounds who finds appropriate rise in bhcg levels, patient should have follow up ultrasound in 2 weeks. Scheduled u/s 12/3 @ 2pm.   Called patient, no answer- left message to call us back for results.  Called patient back & informed her of results and ultrasound appt. Ectopic precautions reviewed. Patient verbalized understanding & had no questions.  Koren Bound RN BSN 05/27/19

## 2019-05-27 NOTE — Progress Notes (Signed)
Patient seen and assessed by nursing staff.  Agree with documentation and plan.  

## 2019-06-10 ENCOUNTER — Other Ambulatory Visit: Payer: Self-pay

## 2019-06-10 ENCOUNTER — Encounter: Payer: Self-pay | Admitting: Obstetrics & Gynecology

## 2019-06-10 ENCOUNTER — Ambulatory Visit (HOSPITAL_COMMUNITY)
Admission: RE | Admit: 2019-06-10 | Discharge: 2019-06-10 | Disposition: A | Discharge status: Home / Self Care | Payer: Medicaid Other | Source: Ambulatory Visit | Attending: Family Medicine | Admitting: Family Medicine

## 2019-06-10 ENCOUNTER — Ambulatory Visit (INDEPENDENT_AMBULATORY_CARE_PROVIDER_SITE_OTHER): Payer: Medicaid Other

## 2019-06-10 DIAGNOSIS — O3680X0 Pregnancy with inconclusive fetal viability, not applicable or unspecified: Secondary | ICD-10-CM | POA: Insufficient documentation

## 2019-06-10 DIAGNOSIS — O3680X1 Pregnancy with inconclusive fetal viability, fetus 1: Secondary | ICD-10-CM | POA: Diagnosis not present

## 2019-06-10 DIAGNOSIS — Z3A01 Less than 8 weeks gestation of pregnancy: Secondary | ICD-10-CM | POA: Diagnosis not present

## 2019-06-10 MED ORDER — PREPLUS 27-1 MG PO TABS: 1.0000 | ORAL_TABLET | Freq: Every day | ORAL | 6 refills | Status: DC

## 2019-06-10 NOTE — Progress Notes (Signed)
Patient seen and assessed by nursing staff during this encounter. I have reviewed the chart and agree with the documentation and plan.  Verita Schneiders, MD 06/10/2019 4:24 PM

## 2019-06-10 NOTE — Progress Notes (Signed)
Pt here today for OB US results for pregnancy of unknown location.  Notified Dr. Harolyn Rutherford pt's [place] results who recommends pt to start prenatal care and start PNV.  Pt informed that she has a viable pregnancy with EDD 02/05/20, 5w 5d today, and FHR 121 bpm.  Pt denies any pain or bleeding.  Medications and allergies reviewed.  Pt request to have an Rx for PNV even though is taking gummies.  I explained to the pt that her insurance does not cover the gummies only the tablets.  [place] office to provide proof of pregnancy letter to start OB care.    Mel Almond, RN 06/10/19

## 2019-06-22 ENCOUNTER — Ambulatory Visit (HOSPITAL_COMMUNITY): Payer: Medicaid Other

## 2019-07-04 ENCOUNTER — Inpatient Hospital Stay (HOSPITAL_COMMUNITY)
Admission: AD | Admit: 2019-07-04 | Discharge: 2019-07-04 | Disposition: A | Discharge status: Home / Self Care | Payer: Medicaid Other | Attending: Family Medicine | Admitting: Family Medicine

## 2019-07-04 ENCOUNTER — Other Ambulatory Visit: Payer: Self-pay

## 2019-07-04 DIAGNOSIS — O99341 Other mental disorders complicating pregnancy, first trimester: Secondary | ICD-10-CM | POA: Diagnosis not present

## 2019-07-04 DIAGNOSIS — Z20828 Contact with and (suspected) exposure to other viral communicable diseases: Secondary | ICD-10-CM | POA: Diagnosis not present

## 2019-07-04 DIAGNOSIS — Z3A01 Less than 8 weeks gestation of pregnancy: Secondary | ICD-10-CM | POA: Insufficient documentation

## 2019-07-04 DIAGNOSIS — O26891 Other specified pregnancy related conditions, first trimester: Secondary | ICD-10-CM

## 2019-07-04 DIAGNOSIS — R0602 Shortness of breath: Secondary | ICD-10-CM

## 2019-07-04 DIAGNOSIS — Z3A10 10 weeks gestation of pregnancy: Secondary | ICD-10-CM

## 2019-07-04 DIAGNOSIS — R05 Cough: Secondary | ICD-10-CM

## 2019-07-04 DIAGNOSIS — J4520 Mild intermittent asthma, uncomplicated: Secondary | ICD-10-CM

## 2019-07-04 DIAGNOSIS — F319 Bipolar disorder, unspecified: Secondary | ICD-10-CM | POA: Diagnosis not present

## 2019-07-04 DIAGNOSIS — R000 Tachycardia, unspecified: Secondary | ICD-10-CM | POA: Insufficient documentation

## 2019-07-04 DIAGNOSIS — O99511 Diseases of the respiratory system complicating pregnancy, first trimester: Secondary | ICD-10-CM | POA: Diagnosis not present

## 2019-07-04 DIAGNOSIS — R059 Cough, unspecified: Secondary | ICD-10-CM

## 2019-07-04 LAB — SARS CORONAVIRUS 2 (TAT 6-24 HRS): SARS Coronavirus 2: NEGATIVE

## 2019-07-04 NOTE — Discharge Instructions (Signed)
Please go home and use your albuterol inhaler. You may use every 4-6 hours as needed. If shortness of breath worsens, please return to ER for evaluation. Please quarantine until COVID results return. Call one of our offices to make an initial OB appt to establish care.   [facility] for [place] for [place] @ [facility]   Phone: [phone]  [facility]   Phone: [place] [facility]  [facility]       Phone: [place] [facility] [place]     Phone: [place] for [place] @ [facility]   Phone: [place] for [place] @ [facility]  Phone: [place] for [place] @ [place])  Phone: [phone]

## 2019-07-04 NOTE — MAU Note (Signed)
Rose Schwartz is a 18 y.o. at [redacted]w[redacted]d here in MAU reporting:  +cough. Producing white clear mucus. +congestion +shortness of breath. Has neem discussing this with her doctor. They talked about exercised induced asthma. Was told to keep her inhaler with her. Denies fever or chills. States she has not been around anyone sick. Denies having traveled in the last 14 days. Onset of complaint: 2 weeks Pain score: denies Denies vaginal bleeding. Denies vaginal discharge. Vitals:   07/04/19 1653  BP: 123/81  Pulse: (!) 115  Resp: 16  Temp: 98.2 F (36.8 C)  SpO2: 99%    FHT: 174 via doppler

## 2019-07-04 NOTE — MAU Provider Note (Signed)
Chief Complaint: Cough, Nasal Congestion, and Shortness of Breath   First Provider Initiated Contact with Patient 07/04/19 1703      SUBJECTIVE HPI: Rose Schwartz is a 18 y.o. G1P0 at [redacted]w[redacted]d by early ultrasound who presents to maternity admissions reporting cough and SOB. Patient reports that for the past 2 weeks she has had intermittent cough and SOB with some loss of taste and smell. She feels congested. Denies fever, sore throat. Patient with hx of asthma and reports she does not have her inhaler with her all the time so she does not use it frequently; has not used for at least 2 weeks. Denies any OB complaints.  She denies vaginal bleeding, vaginal itching/burning, urinary symptoms, h/a, dizziness, n/v, or fever/chills.    Past Medical History:  Diagnosis Date  . Bipolar 1 disorder [facility])    Past Surgical History:  Procedure Laterality Date  . FETAL AMNIOTIC BAND RELEASE     Social History   Socioeconomic History  . Marital status: Single    Spouse name: Not on file  . Number of children: Not on file  . Years of education: Not on file  . Highest education level: Not on file  Occupational History  . Not on file  Tobacco Use  . Smoking status: Never Smoker  . Smokeless tobacco: Never Used  Substance and Sexual Activity  . Alcohol use: No    Comment: SOCIAL  . Drug use: No  . Sexual activity: Not on file  Other Topics Concern  . Not on file  Social History Narrative  . Not on file   Social Determinants of Health   Financial Resource Strain:   . Difficulty of Paying Living Expenses: Not on file  Food Insecurity:   . Worried About Programme researcher, broadcasting/film/video in the Last Year: Not on file  . Ran Out of Food in the Last Year: Not on file  Transportation Needs:   . Lack of Transportation (Medical): Not on file  . Lack of Transportation (Non-Medical): Not on file  Physical Activity:   . Days of Exercise per Week: Not on file  . Minutes of Exercise per Session: Not on  file  Stress:   . Feeling of Stress : Not on file  Social Connections:   . Frequency of Communication with Friends and Family: Not on file  . Frequency of Social Gatherings with Friends and Family: Not on file  . Attends Religious Services: Not on file  . Active Member of Clubs or Organizations: Not on file  . Attends Banker Meetings: Not on file  . Marital Status: Not on file  Intimate Partner Violence:   . Fear of Current or Ex-Partner: Not on file  . Emotionally Abused: Not on file  . Physically Abused: Not on file  . Sexually Abused: Not on file   No current facility-administered medications on file prior to encounter.   Current Outpatient Medications on File Prior to Encounter  Medication Sig Dispense Refill  . albuterol (VENTOLIN HFA) 108 (90 Base) MCG/ACT inhaler Inhale 2 puffs into the lungs every 4 (four) hours as needed for wheezing or shortness of breath (cough, shortness of breath or wheezing.). 1 Inhaler 1  . EPINEPHrine 0.3 mg/0.3 mL IJ SOAJ injection AS DIRECTED AS NEEDED FOR SYSTEMIC REACTIONS INJECTION    . ondansetron (ZOFRAN-ODT) 8 MG disintegrating tablet Take 1 tablet (8 mg total) by mouth every 8 (eight) hours as needed for nausea. (Patient not taking: Reported on  06/10/2019) 20 tablet 0  . Prenatal MV-Min-FA-Omega-3 (PRENATAL GUMMIES/DHA & FA) 0.4-32.5 MG CHEW Chew 2 each by mouth.    . Prenatal Vit-Fe Fumarate-FA (PREPLUS) 27-1 MG TABS Take 1 tablet by mouth daily. 30 tablet 6   Allergies  Allergen Reactions  . Other     Tree nuts    ROS:  Review of Systems All other systems negative unless noted above in HPI.   I have reviewed patient's Past Medical Hx, Surgical Hx, Family Hx, Social Hx, medications and allergies.   Physical Exam   Patient Vitals for the past 24 hrs:  BP Temp Temp src Pulse Resp SpO2  07/04/19 1700 -- -- -- (!) 109 -- 98 %  07/04/19 1653 123/81 98.2 F (36.8 C) Oral (!) 115 16 99 %   Constitutional: Well-developed,  well-nourished female in no acute distress.  Cardiovascular: mild tachycardia  Respiratory: normal effort, mild inspiratory wheezes bilaterally, good air movement throughout  GI: Abd soft, non-tender.  MS: Extremities nontender, no edema, normal ROM Neurologic: Alert and oriented x 4.  PELVIC EXAM: Deferred  FHT 174 by doppler  LAB RESULTS No results found for this or any previous visit (from the past 24 hour(s)).  --/--/O POS (11/16 2036)  IMAGING US OB Transvaginal  Result Date: 06/10/2019 CLINICAL DATA:  First trimester pregnancy, pregnancy of unknown anatomic location, confirm location, viability and dating EXAM: TRANSVAGINAL OB ULTRASOUND TECHNIQUE: Transvaginal ultrasound was performed for complete evaluation of the gestation as well as the maternal uterus, adnexal regions, and pelvic cul-de-sac. COMPARISON:  05/24/2019 FINDINGS: Intrauterine gestational sac: Present, single Yolk sac:  Present Embryo:  Present Cardiac Activity: Present Heart Rate: 121 bpm CRL:   2.9 mm   5 w 5 d                  [place] EDC: 02/05/2020 Subchorionic hemorrhage:  None visualized. Maternal uterus/adnexae: RIGHT ovary normal size and morphology, 4.4 x 1.6 x 1.6 cm. LEFT ovary normal size and morphology, 2.9 x 1.5 x 2.0 cm. Trace free pelvic fluid. No adnexal masses. IMPRESSION: Single live intrauterine gestation at 5 weeks 5 days EGA by crown-rump length. No acute abnormalities. Electronically Signed   By: Lavonia Dana M.D.   On: 06/10/2019 15:21    MAU Management/MDM: Orders Placed This Encounter  Procedures  . SARS CORONAVIRUS 2 (TAT 6-24 HRS) Nasopharyngeal Nasopharyngeal Swab  . Discharge patient Discharge disposition: 01-Home or Self Care; Discharge patient date: 07/04/2019    No orders of the defined types were placed in this encounter.   Patient presented with cough and SOB in setting of known asthma. Lungs with bilateral inspiratory wheezing but not audible without ascultation. Mild tachycardia noted  otherwise vitals stable. Patient tolerating PO well. She preferred to discharge and use home albuterol. COVID swab obtained and patient advised to quarantine until results return. [place] office number given for patient to schedule initial OB visit. Advised copious fluids and rest. Pt discharged with strict return precautions.  ASSESSMENT 1. Cough   2. Shortness of breath   3. Tachycardia   4. Mild intermittent asthma without complication     PLAN Discharge home Allergies as of 07/04/2019      Reactions   Other    Tree nuts      Medication List    STOP taking these medications   ondansetron 8 MG disintegrating tablet Commonly known as: ZOFRAN-ODT     TAKE these medications   albuterol 108 (90 Base) MCG/ACT inhaler Commonly  known as: VENTOLIN HFA Inhale 2 puffs into the lungs every 4 (four) hours as needed for wheezing or shortness of breath (cough, shortness of breath or wheezing.).   EPINEPHrine 0.3 mg/0.3 mL Soaj injection Commonly known as: EPI-PEN AS DIRECTED AS NEEDED FOR SYSTEMIC REACTIONS INJECTION   Prenatal Gummies/DHA & FA 0.4-32.5 MG Chew Chew 2 each by mouth.   PrePLUS 27-1 MG Tabs Take 1 tablet by mouth daily.      Follow-up Information    [facility]. Schedule an appointment as soon as possible for a visit in 2 week(s).   Specialty: Obstetrics and Gynecology Contact information: [address] [id number] mc [place] [phone]          Jerilynn Birkenheadhelsea Denilson Salminen, MD [facility] Family [facility] Fellow, [facility], [facility] 07/04/2019  5:24 PM

## 2019-07-09 NOTE — L&D Delivery Note (Signed)
OB/GYN Faculty Practice Delivery Note  Rose Schwartz is a 19 y.o. G1P0 s/p vag del at [redacted]w[redacted]d. She was admitted for PROM.   ROM: 15h 30m with clear fluid GBS Status: neg Maximum Maternal Temperature: 98.6  Labor Progress: Marland Kitchen [name] was admitted with PROM and desired expectant management with the hopes of having a waterbirth. In the afternoon she received a single dose of cytotec at 2cm, and then proceeded to labor spontaneously. At 9cm she got in the tub and progressed to a vag del an hour later.  Delivery Date/Time: January 17, 2020 at 2238 Delivery: In room while patient was involuntarily pushing in the tub. Head delivered OA. No nuchal cord present. Shoulder and body delivered in usual fashion. Infant with spontaneous cry, placed on mother's abdomen, dried and stimulated. Cord clamped x 2 after 3-minute delay, and cut by Greggory Stallion (FOB). Pt assisted to the bed from the tub. Cord blood drawn. Placenta delivered spontaneously with gentle cord traction. Fundus firm with massage and Pitocin. Labia, perineum, vagina, and cervix inspected and found to be intact.   Placenta: spont, intact Complications: none Lacerations: none EBL: 200cc Analgesia: Fentanyl x 2 while laboring  Postpartum Planning [x]  message to sent to schedule follow-up   Infant: female  APGARs 7/9  3340g (7lb 5.8oz)  9/9, CNM  01/17/2020 11:26 PM

## 2019-07-14 ENCOUNTER — Ambulatory Visit (INDEPENDENT_AMBULATORY_CARE_PROVIDER_SITE_OTHER): Payer: Medicaid Other | Admitting: *Deleted

## 2019-07-14 ENCOUNTER — Other Ambulatory Visit: Payer: Self-pay

## 2019-07-14 DIAGNOSIS — Z3490 Encounter for supervision of normal pregnancy, unspecified, unspecified trimester: Secondary | ICD-10-CM

## 2019-07-14 DIAGNOSIS — F319 Bipolar disorder, unspecified: Secondary | ICD-10-CM | POA: Insufficient documentation

## 2019-07-14 DIAGNOSIS — J4520 Mild intermittent asthma, uncomplicated: Secondary | ICD-10-CM

## 2019-07-14 DIAGNOSIS — J45909 Unspecified asthma, uncomplicated: Secondary | ICD-10-CM | POA: Insufficient documentation

## 2019-07-14 MED ORDER — BLOOD PRESSURE KIT DEVI: 1.0000 | 0 refills | Status: DC | PRN

## 2019-07-14 NOTE — Patient Instructions (Signed)
At [facility], we work as an integrated team, providing care to address both physical and emotional health. Your medical provider may refer you to see our Behavioral Health Clinician (BHC) on the same day you see your medical provider, as availability permits.  Our BHC is available to all patients, visits generally last between 20-30 minutes, but can be longer or shorter, depending on patient need. The BHC offers help with stress management, coping with symptoms of depression and anxiety, major life changes , sleep issues, changing risky behavior, grief and loss, life stress, working on personal life goals, and  behavioral health issues, as these all affect your overall health and wellness.  The BHC is NOT available for the following: court-ordered evaluations, specialty assessments (custody or disability), letters to employers, or obtaining certification for an emotional support animal. The BHC does not provide long-term therapeutic services. You have the right to refuse integrated behavioral health services, or to reschedule to see the BHC at a later date.  Exception: If you are having thoughts of suicide, we require that you either see the BHC for further assessment, or contract for safety with your medical provider prior to checking out.  Confidentiality exception: If it is suspected that a child or disabled adult is being abused or neglected, we are required by law to report that to either Child Protective Services or Adult Protective Services.  If you have a diagnosis of Bipolar affective disorder, Schizophrenia, or recurrent Major depressive disorder, we will recommend that you establish care with a psychiatrist, as these are lifelong, chronic conditions, and we want your overall emotional health and medications to be more closely monitored. If you anticipate needing extended maternity leave due to mental illness, it it recommended that you find a psychiatrist as soon as possible.  Neither the medical provider, nor the BHC, can recommend an extended maternity leave for mental health issues. Your medical provider or BHC may refer you to a therapist for ongoing, traditional therapy, or to a psychiatrist, for medication management, if it would benefit your overall health. Depending on your insurance, you may have a copay to see the BHC. If you are uninsured, it is recommended that you apply for financial assistance. (Forms may be requested at the front desk for in-person visits, via MyChart, or request a form during a virtual visit).  If you see the BHC more than 6 times, you will have to complete a comprehensive clinical assessment interview with the BHC to resume integrated services.  Any questions?   [place] [employer]  Department of Social Services[place] [address], [place], [place] [address] [phone]) [phone]   or  www.[place].gov/[place]/human-services/social-services **SNAP/EBT/ Other nutritional benefits  [place] DHHS-Public Health-WIC [address], [place], [place] [address] [phone]) [phone]  or  https://[place].gov/[place]/human-services/health-department **WIC for  women who are pregnant and postpartum, infants and children up to 5 years old  [employer] [employer] [facility] [address], [place], [place] [address] [phone]) [phone]   or   www.theblessedtable.org  **Food pantry  [employer] [address], [place], [place] [address] [phone]   or   https://brotherkolbes.godaddysites.com  **Emergency food and prepared meals  [place] [place] [employer] [facility] [address], [place], [place] [address] [phone]) [phone]   or   www.cedargrovetop.us **Food pantry  [facility] [employer] [facility] [address], [place], [place] [address] [phone]) [phone]   or   www.facebook.com/[facility]-116430931718202 **Food pantry  [facility]  Food Pantry [address],   [place], [place] [address] [phone]) [phone] **Food pantry  [facility] [address], [place], [place] [address] [phone]) [phone]   or   www.greensborourbanministry.org  **Food pantry and prepared meals  [facility] [address], [place], [place] [address] https://jfsgreensboro.org/  **Food pantry  [facility] [employer] [facility] [address], [place], [place] [address] [phone]) [phone]   or   www.lbcnow.org  **Food pantry  [employer] [facility] [address], [place], [place] [address] [phone]) [phone]   or   http://www.onestepfurther.com **Food pantry, nutrition education, gardening activities  [employer] [facility] [address], [place], [place] [address] [phone]) [phone] **Food pantry  [facility] [place] [address], [place], [place] [address] [phone]) [phone]   or   www.salvationarmyofgreensboro.org **Food pantry  [facility] [place] [address], [place], [place] [address] [phone]) [phone]   or   http://senior-resources-[place].org **Meals on Wheels Program  [facility] [address], , [place] [address] [phone]) [phone]   or   www.stmattchurch.com  **Food pantry  [facility] [facility] [address], , [place] [address] [phone]   or   vandaliapresbyterianchurch.org **Food pantry  [place] [employer]  [place] [employer] [facility] [address], [place], [place] [address] [phone]) [phone]   or   www.facebook.com/JulianUMC Food pantry  [employer] [employer] [facility] [address], [place], [place] [address] [phone]) [phone] **Food pantry  [place] [facility] [employer]   Department of Public Health[place] [address], [place], [place] [address] [phone]) [phone]   or   www.co.[place].us/ph/  [place] DHHS-Public Health-WIC ([place]) [address], [place], [place] [address] [phone]) [phone]   or   https://www.[place].gov/[place]/human-services/health-department **WIC for pregnant and postpartum women, infants and children up to 5 years old  [facility] [address], [place], [place] [address] [phone]) [phone] **Food pantry  [facility] [address], [place], [place] [address] [phone]   or   emerywoodbaptistchurch.com *Food pantry  [employer] [facility] [address], [place], [place] [address] [phone]) [phone]   or   www.fcch[place].org **Food pantry  [facility] [address], [place], [place] [address] [phone]) [phone]   or   www.helpinghandshp.org **Food pantry  [employer] [facility] [address], [place], [place] [address] [phone]) [phone]   or   www.facebook.com/KBCI1 **Food Pantry  [employer] [facility] [address], [place], [place] [address] [phone]) [phone]   or   www.abbottscreek.org **Food pantry  [employer] [facility] [place] [phone])[phone]   **Delivers meals  [facility] [address], [place], [place] [address] [phone]) [phone]   or   nbfgm.sundaystreamwebsites.com  **Food pantry  [facility] [place] [address], [place], [place] [address] [phone]) [phone]   or   www.odm-hp.org  **Food pantry  [facility] [address], [place], [place] [address] [phone]   or   R2live.tv **Food pantry  [facility] [address], [place], [place] [address] [phone]) [phone]   or   www.salvationarmycarolinas.org/[place] **Emergency food and pet food  [facility] [address], [place],  [address] [phone])[phone]   or   www.senioradults.org **Congregate and delivered meals to older adults  [employer] [facility] [place] [address], [place],   [address] [phone]) [phone]     or   www.unitedwayhp.org **Back Pack Program for elementary school students  [facility] [address], [place], [place] [address] [phone]) 888-6091   or   www.wardstreetcommunityresources.org **Food pantry  [facility] [address], [place], [place] [address] [phone]   or   www.ywcahp.com **Emergency food, nutrition classes, food budgeting   

## 2019-07-14 NOTE — Progress Notes (Signed)
I connected with  Rose Schwartz on 07/14/19 at  2:30 PM EST by telephone and verified that I am speaking with the correct person using two identifiers.   I discussed the limitations, risks, security and privacy concerns of performing an evaluation and management service by telephone and the availability of in person appointments. I also discussed with the patient that there may be a patient responsible charge related to this service. The patient expressed understanding and agreed to proceed. Explained I am completing her New OB Intake today. We discussed Her EDD and that it is based on  Early [place]. I reviewed her allergies, meds, OB History, Medical /Surgical history, and appropriate screenings. She does have some food insecurity issues. I informed her I put resources in her checkout note today. She did have + screen for depression.  I offered [facility] app[facility] with [facility] which she accepted. I explained registrars will schedule and notify her of appointment.  Patient and/or legal guardian verbally consented to [facility] services about presenting concerns and psychiatric consultation as appropriate.    I explained I will send her the Babyscripts app- app sent to her while on phone but she was unable to download. I explained we will help her with that at her new ob visit tomorrow.  I explained we will send a blood pressure cuff to [facility] that will fill that prescription and they  will call her to verify her information. I asked her to bring the blood pressure cuff with her to her next ob appointment so we can show her how to use it. Explained  then we will have her take her blood pressure weekly and enter into the app. Explained she will have some visits in office and some virtually. She already has Sports coach. I reviewed her new ob  appointment date/ time with her , our location and to wear mask, no visitors.  I explained she will have a pelvic exam, ob bloodwork, hemoglobin a1C, cbg  , genetic testing if desired,- she does want a panorama. I scheduled an Korea at 19 weeks and gave her the appointment. She voices understanding.  Brigida Scotti,RN 07/14/2019  2:30 PM

## 2019-07-15 ENCOUNTER — Ambulatory Visit (INDEPENDENT_AMBULATORY_CARE_PROVIDER_SITE_OTHER): Payer: Medicaid Other | Admitting: Obstetrics and Gynecology

## 2019-07-15 VITALS — BP 116/78 | HR 95 | Wt 136.0 lb

## 2019-07-15 DIAGNOSIS — Z3A10 10 weeks gestation of pregnancy: Secondary | ICD-10-CM | POA: Diagnosis not present

## 2019-07-15 DIAGNOSIS — Q740 Other congenital malformations of upper limb(s), including shoulder girdle: Secondary | ICD-10-CM | POA: Diagnosis not present

## 2019-07-15 DIAGNOSIS — Z23 Encounter for immunization: Secondary | ICD-10-CM

## 2019-07-15 DIAGNOSIS — Z3491 Encounter for supervision of normal pregnancy, unspecified, first trimester: Secondary | ICD-10-CM | POA: Diagnosis not present

## 2019-07-15 DIAGNOSIS — Z3490 Encounter for supervision of normal pregnancy, unspecified, unspecified trimester: Secondary | ICD-10-CM

## 2019-07-15 DIAGNOSIS — Z315 Encounter for genetic counseling: Secondary | ICD-10-CM | POA: Diagnosis not present

## 2019-07-15 DIAGNOSIS — Z3481 Encounter for supervision of other normal pregnancy, first trimester: Secondary | ICD-10-CM | POA: Diagnosis not present

## 2019-07-15 NOTE — Patient Instructions (Signed)

## 2019-07-15 NOTE — Progress Notes (Signed)
History:   Rose Schwartz is a 19 y.o. G1P0 at 38w5dby early ultrasound being seen today for her first obstetrical visit.  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 Patient does intend to breast feed. Pregnancy history fully reviewed. Unplanned pregnancy, unsure if she will keep the pregnancy.   Patient reports no complaints. HISTORY: OB History  Gravida Para Term Preterm AB Living  1 0 0 0 0 0  SAB TAB Ectopic Multiple Live Births  0 0 0 0 0    # Outcome Date GA Lbr Len/2nd Weight Sex Delivery Anes PTL Lv  1 Current             Last pap smear was done NA   Past Medical History:  Diagnosis Date  . Asthma   . Bipolar 1 disorder ([place]   . UTI (urinary tract infection)    Past Surgical History:  Procedure Laterality Date  . FETAL AMNIOTIC BAND RELEASE     Family History  Problem Relation Age of Onset  . Fibromyalgia Mother    Social History   Tobacco Use  . Smoking status: Never Smoker  . Smokeless tobacco: Never Used  Substance Use Topics  . Alcohol use: Not Currently    Comment: SOCIAL  . Drug use: Not Currently    Types: Marijuana    Comment: few years ago   Allergies  Allergen Reactions  . Other     Tree nuts   Current Outpatient Medications on File Prior to Visit  Medication Sig Dispense Refill  . albuterol (VENTOLIN HFA) 108 (90 Base) MCG/ACT inhaler Inhale 2 puffs into the lungs every 4 (four) hours as needed for wheezing or shortness of breath (cough, shortness of breath or wheezing.). 1 Inhaler 1  . Blood Pressure Monitoring (BLOOD PRESSURE KIT) DEVI 1 Device by Does not apply route as needed. 1 each 0  . EPINEPHrine 0.3 mg/0.3 mL IJ SOAJ injection AS DIRECTED AS NEEDED FOR SYSTEMIC REACTIONS INJECTION    . Prenatal MV-Min-FA-Omega-3 (PRENATAL GUMMIES/DHA & FA) 0.4-32.5 MG CHEW Chew 2 each by mouth.    . Prenatal Vit-Fe Fumarate-FA (PREPLUS) 27-1 MG TABS Take 1 tablet by mouth daily. 30 tablet 6   No  current facility-administered medications on file prior to visit.    Review of Systems Pertinent items noted in HPI and remainder of comprehensive ROS otherwise negative. Physical Exam:   Vitals:   07/15/19 0903  BP: 116/78  Pulse: 95  Weight: 136 lb (61.7 kg)   Fetal Heart Rate (bpm): 160 Uterus:     System: General: well-developed, well-nourished female in no acute distress   Skin: normal coloration and turgor, no rashes   Neurologic: oriented, normal, negative, normal mood   Extremities: normal strength, tone, and muscle mass, ROM of all joints is normal   HEENT PERRLA, extraocular movement intact and sclera clear, anicteric   Mouth/Teeth mucous membranes moist, pharynx normal without lesions and dental hygiene good   Neck supple and no masses   Cardiovascular: regular rate and rhythm   Respiratory:  no respiratory distress, normal breath sounds   Abdomen: soft, non-tender; bowel sounds normal; no masses,  no organomegaly    Assessment:    Pregnancy: G1P0 Patient Active Problem List   Diagnosis Date Noted  . 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 Supervision of low-risk pregnancy 07/14/2019  . Asthma   . Bipolar 1 disorder ([place]      Plan:  1. Encounter for supervision of low-risk pregnancy, antepartum  - Genetic Screening - Obstetric Panel, Including HIV - Hemoglobin A1c - Culture, OB Urine  2. 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 Continue prenatal vitamins. Genetic Screening discussed, NIPS: requested. Ultrasound discussed; fetal anatomic survey: ordered. Problem list reviewed and updated. The nature of [place] with multiple MDs and other Advanced Practice Providers was explained to patient; also emphasized that residents, students are part of our team. Routine obstetric precautions reviewed.    [place]asch, Artist Pais, [place] for [employer], [place]

## 2019-07-16 LAB — OBSTETRIC PANEL, INCLUDING HIV
Antibody Screen: NEGATIVE
Basophils Absolute: 0.1 10*3/uL (ref 0.0–0.2)
Basos: 1 %
EOS (ABSOLUTE): 0.4 10*3/uL (ref 0.0–0.4)
Eos: 5 %
HIV Screen 4th Generation wRfx: NONREACTIVE
Hematocrit: 35.9 % (ref 34.0–46.6)
Hemoglobin: 12.3 g/dL (ref 11.1–15.9)
Hepatitis B Surface Ag: NEGATIVE
Immature Grans (Abs): 0 10*3/uL (ref 0.0–0.1)
Immature Granulocytes: 0 %
Lymphocytes Absolute: 1.7 10*3/uL (ref 0.7–3.1)
Lymphs: 23 %
MCH: 29.4 pg (ref 26.6–33.0)
MCHC: 34.3 g/dL (ref 31.5–35.7)
MCV: 86 fL (ref 79–97)
Monocytes Absolute: 0.5 10*3/uL (ref 0.1–0.9)
Monocytes: 7 %
Neutrophils Absolute: 4.7 10*3/uL (ref 1.4–7.0)
Neutrophils: 64 %
Platelets: 264 10*3/uL (ref 150–450)
RBC: 4.19 x10E6/uL (ref 3.77–5.28)
RDW: 12.8 % (ref 11.7–15.4)
RPR Ser Ql: NONREACTIVE
Rh Factor: POSITIVE
Rubella Antibodies, IGG: 1 index (ref >0.99)
WBC: 7.4 10*3/uL (ref 3.4–10.8)

## 2019-07-16 LAB — HEMOGLOBIN A1C
Est. average glucose Bld gHb Est-mCnc: 105 mg/dL
Hgb A1c MFr Bld: 5.3 % (ref 4.8–5.6)

## 2019-07-17 LAB — URINE CULTURE, OB REFLEX

## 2019-07-17 LAB — CULTURE, OB URINE

## 2019-07-26 ENCOUNTER — Encounter: Payer: Self-pay | Admitting: *Deleted

## 2019-07-26 NOTE — BH Specialist Note (Signed)
Integrated Behavioral Health via Telemedicine Video Visit  07/26/2019 Rose Schwartz [id number]  Number of Integrated Behavioral Health visits: 1 Session Start time: 1:20  Session End time: 2:16 Total time: 14  Referring Provider: Venia Carbon, NP Type of Visit: Video Patient/Family location: Home [facility] Provider location: [facility] All persons participating in visit: [name] and [facility] Rose Schwartz    Confirmed patient's address: Yes  Confirmed patient's phone number: Yes  Any changes to demographics: No   Confirmed patient's insurance: Yes  Any changes to patient's insurance: No   Discussed confidentiality: Yes   I connected with Rose Schwartz  by a video enabled telemedicine application and verified that I am speaking with the correct person using two identifiers.     I discussed the limitations of evaluation and management by telemedicine and the availability of in person appointments.  I discussed that the purpose of this visit is to provide behavioral health care while limiting exposure to the novel coronavirus.   Discussed there is a possibility of technology failure and discussed alternative modes of communication if that failure occurs.  I discussed that engaging in this video visit, they consent to the provision of behavioral healthcare and the services will be billed under their insurance.  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 Pt was on Lithium and Abilify for a brief period of time, and stopped after having auditory hallucinations, uncertain if taking marijuana with Abilify  increased voices.  Pt is considering listening to more [employer], and will consider referral to psychiatry at a later date. Duration of problem: Current pregnancy; ongoing; Severity of problem: severe  STRENGTHS (Protective Factors/Coping Skills): Open to learn, hopeful outlook  GOALS ADDRESSED: Patient will: 1.  Reduce symptoms of: mood instability  2.  Increase knowledge and/or ability of: coping skills and healthy habits  3.  Demonstrate ability to: Increase healthy adjustment to current life circumstances  INTERVENTIONS: Interventions utilized:  Supportive Counseling and Psychoeducation and/or Health Education Standardized Assessments completed: Not given today  ASSESSMENT: Patient currently experiencing Bipolar 1 disorder and Psychosocial stress  Patient may benefit from psychoeducation and brief therapeutic interventions regarding coping with symptoms of mood instability and life stress .  PLAN: 1. Follow up with behavioral health clinician on : One week 2. Behavioral recommendations:  -Set timer on phone daily to write for 15 minutes daily about decision: free flowing thoughts -Listen to one [employer] podcast or Felicity Pellegrini Talk this week on topic of shame -Continue taking prenatal vitamin daily 3. Referral(s): Integrated [employer] (In Clinic)  I discussed the assessment and treatment plan with the patient and/or parent/guardian. They were provided an opportunity to ask questions and all were answered. They agreed with the plan and demonstrated an understanding of the instructions.   They were advised to call back or seek an in-person evaluation if the symptoms worsen or if the condition fails to improve as anticipated.  Rose Schwartz  Depression screen [facility] 2/9 07/14/2019  Decreased Interest 2  Down, Depressed, Hopeless 1  PHQ - 2 Score 3  Altered sleeping 1  Tired, decreased energy 3  Change in appetite 3  Feeling bad or  failure about yourself   1  Trouble concentrating 1  Moving slowly or fidgety/restless 0  Suicidal thoughts 0  PHQ-9 Score 12   GAD 7 : Generalized Anxiety Score 07/14/2019  Nervous, Anxious, on Edge 1  Control/stop worrying 1  Worry too much - different things 1  Trouble relaxing 1  Restless 1  Easily annoyed or irritable 1  Afraid - awful might happen 1  Total GAD 7 Score 7

## 2019-07-27 ENCOUNTER — Ambulatory Visit (INDEPENDENT_AMBULATORY_CARE_PROVIDER_SITE_OTHER): Payer: Medicaid Other | Admitting: Clinical

## 2019-07-27 ENCOUNTER — Other Ambulatory Visit: Payer: Self-pay

## 2019-07-27 DIAGNOSIS — F319 Bipolar disorder, unspecified: Secondary | ICD-10-CM

## 2019-07-27 DIAGNOSIS — O99341 Other mental disorders complicating pregnancy, first trimester: Secondary | ICD-10-CM

## 2019-07-27 DIAGNOSIS — Z658 Other specified problems related to psychosocial circumstances: Secondary | ICD-10-CM

## 2019-07-28 ENCOUNTER — Telehealth (INDEPENDENT_AMBULATORY_CARE_PROVIDER_SITE_OTHER): Payer: Medicaid Other | Admitting: Lactation Services

## 2019-07-28 DIAGNOSIS — Z3492 Encounter for supervision of normal pregnancy, unspecified, second trimester: Secondary | ICD-10-CM

## 2019-07-28 NOTE — Telephone Encounter (Signed)
Called pt to inform her of there Genetic Screening results. Pt informed that she is a carrier for Alpha Thalassemia. Pt informed to call [facility] at [phone] to schedule a Genetic Counseling session. Discussed they may want to have FOB tested for the same gene.  Pt voiced understanding.

## 2019-07-29 DIAGNOSIS — Z3400 Encounter for supervision of normal first pregnancy, unspecified trimester: Secondary | ICD-10-CM | POA: Diagnosis not present

## 2019-08-02 ENCOUNTER — Encounter: Payer: Self-pay | Admitting: *Deleted

## 2019-08-02 NOTE — BH Specialist Note (Signed)
Integrated Behavioral Health via Telemedicine Video Visit  08/02/2019 Rose Schwartz [id number]  Number of Integrated Behavioral Health visits: 2 Session Start time: 1:23  Session End time: 1:55 Total time: 32  Referring Provider: Venia Carbon, NP Type of Visit: Video Patient/Family location: Home [facility] Provider location: [facility] All persons participating in visit: [name] and Rose Schwartz    Confirmed patient's address: Yes  Confirmed patient's phone number: Yes  Any changes to demographics: No   Confirmed patient's insurance: Yes  Any changes to patient's insurance: No   Discussed confidentiality: At previous visit  I connected with Rose Schwartz  by a video enabled telemedicine application and verified that I am speaking with Rose correct person using two identifiers.     I discussed Rose limitations of evaluation and management by telemedicine and Rose availability of in person appointments.  I discussed that Rose purpose of this visit is to provide behavioral health care while limiting exposure to Rose novel coronavirus.   Discussed there is a possibility of technology failure and discussed alternative modes of communication if that failure occurs.  I discussed that engaging in this video visit, they consent to Rose provision of behavioral healthcare and Rose services will be billed under their insurance.  Patient and/or legal guardian expressed understanding and consented to video visit: Yes   PRESENTING CONCERNS: Patient and/or family reports Rose following symptoms/concerns: Pt states her primary concern today is interpersonal relationship issue with FOB's mother; says it helps to talk through her feelings today.  Duration of problem: Current pregnancy; Severity of problem: moderate  STRENGTHS (Protective Factors/Coping Skills): Hopeful outlook; open to implement self-coping strategies; FOB supportive  GOALS ADDRESSED: Patient  will: 1.  Reduce symptoms of: mood instability  2.  Increase knowledge and/or ability of: self-management skills  3.  Demonstrate ability to: Increase healthy adjustment to current life circumstances  INTERVENTIONS: Interventions utilized:  Brief CBT Standardized Assessments completed: Not given today  ASSESSMENT: Patient currently experiencing Bipolar 1 disorder and Psychosocial stress  Patient may benefit from continued psychoeducation and brief therapeutic interventions regarding coping with symptoms of mood instability and life stress .  PLAN: 1. Follow up with behavioral health clinician on : Two weeks 2. Behavioral recommendations:  -Begin writing 15 minutes per day about upcoming decision -Continue taking prenatal vitamin daily -Re-watch [employer] podcast, specifically on topic of shame -Consider referral to psychiatry at a future visit 3. Referral(s): Integrated [employer] (In Clinic)  I discussed Rose assessment and treatment plan with Rose patient and/or parent/guardian. They were provided an opportunity to ask questions and all were answered. They agreed with Rose plan and demonstrated an understanding of Rose instructions.   They were advised to call back or seek an in-person evaluation if Rose symptoms worsen or if Rose condition fails to improve as anticipated.  Rose Schwartz

## 2019-08-03 ENCOUNTER — Ambulatory Visit (INDEPENDENT_AMBULATORY_CARE_PROVIDER_SITE_OTHER): Payer: Medicaid Other | Admitting: Clinical

## 2019-08-03 DIAGNOSIS — Z658 Other specified problems related to psychosocial circumstances: Secondary | ICD-10-CM | POA: Diagnosis not present

## 2019-08-03 DIAGNOSIS — F319 Bipolar disorder, unspecified: Secondary | ICD-10-CM

## 2019-08-04 ENCOUNTER — Encounter: Payer: Self-pay | Admitting: Obstetrics and Gynecology

## 2019-08-04 DIAGNOSIS — D563 Thalassemia minor: Secondary | ICD-10-CM | POA: Insufficient documentation

## 2019-08-10 ENCOUNTER — Inpatient Hospital Stay (HOSPITAL_COMMUNITY)
Admission: AD | Admit: 2019-08-10 | Discharge: 2019-08-10 | Disposition: A | Discharge status: Home / Self Care | Payer: Medicaid Other | Attending: Family Medicine | Admitting: Family Medicine

## 2019-08-10 ENCOUNTER — Other Ambulatory Visit: Payer: Self-pay

## 2019-08-10 ENCOUNTER — Encounter (HOSPITAL_COMMUNITY): Payer: Self-pay | Admitting: Family Medicine

## 2019-08-10 DIAGNOSIS — F319 Bipolar disorder, unspecified: Secondary | ICD-10-CM | POA: Diagnosis not present

## 2019-08-10 DIAGNOSIS — S39012A Strain of muscle, fascia and tendon of lower back, initial encounter: Secondary | ICD-10-CM | POA: Diagnosis not present

## 2019-08-10 DIAGNOSIS — O99512 Diseases of the respiratory system complicating pregnancy, second trimester: Secondary | ICD-10-CM | POA: Insufficient documentation

## 2019-08-10 DIAGNOSIS — O99342 Other mental disorders complicating pregnancy, second trimester: Secondary | ICD-10-CM | POA: Diagnosis not present

## 2019-08-10 DIAGNOSIS — D563 Thalassemia minor: Secondary | ICD-10-CM

## 2019-08-10 DIAGNOSIS — J4520 Mild intermittent asthma, uncomplicated: Secondary | ICD-10-CM | POA: Diagnosis not present

## 2019-08-10 DIAGNOSIS — O99891 Other specified diseases and conditions complicating pregnancy: Secondary | ICD-10-CM | POA: Diagnosis not present

## 2019-08-10 DIAGNOSIS — M549 Dorsalgia, unspecified: Secondary | ICD-10-CM

## 2019-08-10 DIAGNOSIS — Z3A14 14 weeks gestation of pregnancy: Secondary | ICD-10-CM | POA: Insufficient documentation

## 2019-08-10 DIAGNOSIS — O26892 Other specified pregnancy related conditions, second trimester: Secondary | ICD-10-CM | POA: Diagnosis not present

## 2019-08-10 DIAGNOSIS — Z3492 Encounter for supervision of normal pregnancy, unspecified, second trimester: Secondary | ICD-10-CM

## 2019-08-10 DIAGNOSIS — M545 Low back pain: Secondary | ICD-10-CM | POA: Diagnosis present

## 2019-08-10 LAB — URINALYSIS, ROUTINE W REFLEX MICROSCOPIC
Bacteria, UA: NONE SEEN
Bilirubin Urine: NEGATIVE
Glucose, UA: NEGATIVE mg/dL
Hgb urine dipstick: NEGATIVE
Ketones, ur: 5 mg/dL — AB
Nitrite: NEGATIVE
Protein, ur: NEGATIVE mg/dL
Specific Gravity, Urine: 1.031 — ABNORMAL HIGH (ref 1.005–1.030)
pH: 5 (ref 5.0–8.0)

## 2019-08-10 LAB — WET PREP, GENITAL
Clue Cells Wet Prep HPF POC: NONE SEEN
Sperm: NONE SEEN
Trich, Wet Prep: NONE SEEN
Yeast Wet Prep HPF POC: NONE SEEN

## 2019-08-10 MED ORDER — CYCLOBENZAPRINE HCL 10 MG PO TABS: 10.0000 mg | ORAL_TABLET | Freq: Every evening | ORAL | 2 refills | Status: DC | PRN

## 2019-08-10 MED ORDER — CYCLOBENZAPRINE HCL 10 MG PO TABS
10.0000 mg | ORAL_TABLET | Freq: Once | ORAL | Status: AC
  Administered 2019-08-10: 10 mg via ORAL
  Filled 2019-08-10: qty 1

## 2019-08-10 NOTE — MAU Provider Note (Signed)
Chief Complaint: Back Pain   First Provider Initiated Contact with Patient 08/10/19 2112      SUBJECTIVE HPI: Rose Schwartz is a 19 y.o. G1P0 at 68w3dwho presents to maternity admissions reporting lower back pain. 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 Back pain is in low middle back. Pain is worse at night and she reports sometimes she will have a spasm and her back will hurt a lot when she tries to turn over in bed. Also worse when sitting on the ground with the children. Denies any pain radiating down her legs. Some nausea yesterday morning. Reports some watery discharge over the last several weeks that wets her underwear. Discharge has a bad odor. Mild pelvic pressure as well.  She denies vaginal bleeding, vaginal itching/burning, urinary symptoms, h/a, dizziness, nausea, or fever/chills.    Past Medical History:  Diagnosis Date  . Asthma   . [place]   . UTI (urinary tract infection)    Past Surgical History:  Procedure Laterality Date  . FETAL AMNIOTIC BAND RELEASE     Social History   Socioeconomic History  . Marital status: Single    Spouse name: Not on file  . Number of children: Not on file  . Years of education: Not on file  . Highest education level: Not on file  Occupational History  . Not on file  Tobacco Use  . Smoking status: Never Smoker  . Smokeless tobacco: Never Used  Substance and Sexual Activity  . Alcohol use: Not Currently    Comment: SOCIAL  . Drug use: Not Currently    Types: Marijuana    Comment: few years ago  . Sexual activity: Yes    Birth control/protection: None  Other Topics Concern  . Not on file  Social History Narrative  . Not on file   Social Determinants of Health   Financial Resource Strain:   . Difficulty of Paying Living Expenses: Not on file  Food Insecurity: Food Insecurity Present  . Worried About RCharity fundraiserin the Last Year: Sometimes true  . Ran Out of Food in  the Last Year: Sometimes true  Transportation Needs: No Transportation Needs  . Lack of Transportation (Medical): No  . Lack of Transportation (Non-Medical): No  Physical Activity:   . Days of Exercise per Week: Not on file  . Minutes of Exercise per Session: Not on file  Stress:   . Feeling of Stress : Not on file  Social Connections:   . Frequency of Communication with Friends and Family: Not on file  . Frequency of Social Gatherings with Friends and Family: Not on file  . Attends Religious Services: Not on file  . Active Member of Clubs or Organizations: Not on file  . Attends CArchivistMeetings: Not on file  . Marital Status: Not on file  Intimate Partner Violence:   . Fear of Current or Ex-Partner: Not on file  . Emotionally Abused: Not on file  . Physically Abused: Not on file  . Sexually Abused: Not on file   No current facility-administered medications on file prior to encounter.   Current Outpatient Medications on File Prior to Encounter  Medication Sig Dispense Refill  . albuterol (VENTOLIN HFA) 108 (90 Base) MCG/ACT inhaler Inhale 2 puffs into the lungs every 4 (four) hours as needed for wheezing or shortness of breath (cough, shortness of breath or wheezing.). 1 Inhaler 1  .  Blood Pressure Monitoring (BLOOD PRESSURE KIT) DEVI 1 Device by Does not apply route as needed. 1 each 0  . EPINEPHrine 0.3 mg/0.3 mL IJ SOAJ injection AS DIRECTED AS NEEDED FOR SYSTEMIC REACTIONS INJECTION    . Prenatal MV-Min-FA-Omega-3 (PRENATAL GUMMIES/DHA & FA) 0.4-32.5 MG CHEW Chew 2 each by mouth.    . Prenatal Vit-Fe Fumarate-FA (PREPLUS) 27-1 MG TABS Take 1 tablet by mouth daily. 30 tablet 6   Allergies  Allergen Reactions  . Other     Tree nuts    ROS:  Review of Systems All other systems negative unless noted above in HPI.   I have reviewed patient's Past Medical Hx, Surgical Hx, Family Hx, Social Hx, medications and allergies.   Physical Exam   Patient Vitals for  the past 24 hrs:  BP Temp Temp src Pulse Resp Height Weight  08/10/19 2042 121/69 99.1 F (37.3 C) Oral (!) 108 20 '5\' 1"'  (1.549 m) 59.7 kg   Constitutional: Well-developed, well-nourished female in no acute distress.  Cardiovascular: mild tachycardia  Respiratory: normal effort GI: Abd soft, non-tender.  MS: Extremities nontender, no edema, normal ROM. Bilateral lumbosacral tightness and tenderness to palpation.  Neurologic: Alert and oriented x 4.  GU: Neg CVAT. Bimanual exam: Cervix 0/long/high, firm, anterior, neg CMT, uterus nontender, nonenlarged, adnexa without tenderness, enlargement, or mass  FHT: 144 bpm  LAB RESULTS Results for orders placed or performed during the hospital encounter of 08/10/19 (from the past 24 hour(s))  Wet prep, genital     Status: Abnormal   Collection Time: 08/10/19  9:22 PM   Specimen: Genital  Result Value Ref Range   Yeast Wet Prep HPF POC NONE SEEN NONE SEEN   Trich, Wet Prep NONE SEEN NONE SEEN   Clue Cells Wet Prep HPF POC NONE SEEN NONE SEEN   WBC, Wet Prep HPF POC FEW (A) NONE SEEN   Sperm NONE SEEN   Urinalysis, Routine w reflex microscopic     Status: Abnormal   Collection Time: 08/10/19  9:27 PM  Result Value Ref Range   Color, Urine YELLOW YELLOW   APPearance HAZY (A) CLEAR   Specific Gravity, Urine 1.031 (H) 1.005 - 1.030   pH 5.0 5.0 - 8.0   Glucose, UA NEGATIVE NEGATIVE mg/dL   Hgb urine dipstick NEGATIVE NEGATIVE   Bilirubin Urine NEGATIVE NEGATIVE   Ketones, ur 5 (A) NEGATIVE mg/dL   Protein, ur NEGATIVE NEGATIVE mg/dL   Nitrite NEGATIVE NEGATIVE   Leukocytes,Ua MODERATE (A) NEGATIVE   RBC / HPF 0-5 0 - 5 RBC/hpf   WBC, UA 0-5 0 - 5 WBC/hpf   Bacteria, UA NONE SEEN NONE SEEN   Squamous Epithelial / LPF 0-5 0 - 5   Mucus PRESENT     O/Positive/-- (01/07 8563)  IMAGING No results found.  MAU Management/MDM: Orders Placed This Encounter  Procedures  . Wet prep, genital  . Culture, Urine  . Urinalysis, Routine w  reflex microscopic  . Discharge patient    Meds ordered this encounter  Medications  . cyclobenzaprine (FLEXERIL) tablet 10 mg  . cyclobenzaprine (FLEXERIL) 10 MG tablet    Sig: Take 1 tablet (10 mg total) by mouth at bedtime as needed for muscle spasms.    Dispense:  30 tablet    Refill:  2    Patient presented to MAU with lower back pain. By exam and history, likely lumbosacral strain. Flexeril given. Wet prep and GC/Chlamydia collected. Wet prep without infection. UA with moderate  leuks and culture ordered.  Advised back exercises/stretching, application of heat/ice and caution with straining when bending over.  Pt discharged with strict return precautions.  Future Appointments  Date Time Provider [place]  08/12/2019  1:15 PM Rasch, Artist Pais, NP [place]  08/17/2019  1:15 PM [place]  09/10/2019  8:00 AM WH-MFC [place] 3 WH-MFCUS MFC-US    ASSESSMENT 1. Lumbosacral strain, initial encounter   2. Alpha thalassemia silent carrier   3. Encounter for supervision of low-risk pregnancy in second trimester   4. Mild intermittent asthma without complication   5. Bipolar 1 disorder ([place])   6. Back pain affecting pregnancy in second trimester     PLAN Discharge home Allergies as of 08/10/2019      Reactions   Other    Tree nuts      Medication List    TAKE these medications   albuterol 108 (90 Base) MCG/ACT inhaler Commonly known as: VENTOLIN HFA Inhale 2 puffs into the lungs every 4 (four) hours as needed for wheezing or shortness of breath (cough, shortness of breath or wheezing.).   Blood Pressure Kit Devi 1 Device by Does not apply route as needed.   cyclobenzaprine 10 MG tablet Commonly known as: FLEXERIL Take 1 tablet (10 mg total) by mouth at bedtime as needed for muscle spasms.   EPINEPHrine 0.3 mg/0.3 mL Soaj injection Commonly known as: EPI-PEN AS DIRECTED AS NEEDED FOR SYSTEMIC REACTIONS INJECTION   Prenatal  Gummies/DHA & FA 0.4-32.5 MG Chew Chew 2 each by mouth.   PrePLUS 27-1 MG Tabs Take 1 tablet by mouth daily.      Follow-up [place] for [facility] Follow up.   Specialty: Obstetrics and Gynecology Contact information: [address] [place], [place] [id number] [place] [id number] [place], Egan Fellow, [facility] for [facility] [place] [facility] 08/10/2019  10:09 PM

## 2019-08-10 NOTE — MAU Note (Signed)
PT SAYS HER LOWER BACK HURTS X1 WEEK - NO MEDS.    [facility]-  WITH   CLINC.- ON Thursday 4TH.

## 2019-08-11 LAB — URINE CULTURE

## 2019-08-11 LAB — GC/CHLAMYDIA PROBE AMP (~~LOC~~) NOT AT ARMC
Chlamydia: NEGATIVE
Comment: NEGATIVE
Comment: NORMAL
Neisseria Gonorrhea: NEGATIVE

## 2019-08-12 ENCOUNTER — Encounter (INDEPENDENT_AMBULATORY_CARE_PROVIDER_SITE_OTHER): Payer: Medicaid Other | Admitting: Obstetrics and Gynecology

## 2019-08-12 ENCOUNTER — Encounter: Payer: Self-pay | Admitting: Obstetrics and Gynecology

## 2019-08-12 ENCOUNTER — Telehealth: Payer: Self-pay | Admitting: Obstetrics and Gynecology

## 2019-08-12 ENCOUNTER — Encounter: Payer: Self-pay | Admitting: General Practice

## 2019-08-12 DIAGNOSIS — Z3400 Encounter for supervision of normal first pregnancy, unspecified trimester: Secondary | ICD-10-CM | POA: Diagnosis not present

## 2019-08-12 NOTE — Progress Notes (Signed)
@  115pm No Answer LVM that I will call back in 5 mins and to be available by phone. @124pm  no answer LVM that I was trying to reach her and sorry we missed her and to call the office back to reschedule for a better time for her.

## 2019-08-12 NOTE — Telephone Encounter (Signed)
Attempted to contact patient to get her rescheduled for her missed ob appointment. No answer, left voicemail for patient to give the office a call back to be rescheduled. No show letter mailed 

## 2019-08-14 NOTE — Progress Notes (Signed)
Patient did not keep her appointment today.   Duane Lope, NP 08/14/2019 10:49 AM

## 2019-08-16 NOTE — BH Specialist Note (Signed)
Pt did not arrive to video visit and did not answer the phone ; Unable to leave voicemail as voicemail is full; left MyChart message for patient.    Integrated Behavioral Health via Telemedicine Video Visit  08/16/2019 MAIKAYLA BEGGS [id number]  Rae Lips

## 2019-08-17 ENCOUNTER — Ambulatory Visit: Payer: Medicaid Other | Admitting: Clinical

## 2019-08-17 DIAGNOSIS — Z5329 Procedure and treatment not carried out because of patient's decision for other reasons: Secondary | ICD-10-CM

## 2019-08-17 DIAGNOSIS — Z91199 Patient's noncompliance with other medical treatment and regimen due to unspecified reason: Secondary | ICD-10-CM

## 2019-08-24 ENCOUNTER — Other Ambulatory Visit: Payer: Self-pay

## 2019-08-24 ENCOUNTER — Encounter (HOSPITAL_COMMUNITY): Payer: Self-pay | Admitting: Obstetrics and Gynecology

## 2019-08-24 ENCOUNTER — Inpatient Hospital Stay (HOSPITAL_COMMUNITY)
Admission: AD | Admit: 2019-08-24 | Discharge: 2019-08-24 | Disposition: A | Discharge status: Home / Self Care | Payer: Medicaid Other | Attending: Obstetrics and Gynecology | Admitting: Obstetrics and Gynecology

## 2019-08-24 DIAGNOSIS — J45909 Unspecified asthma, uncomplicated: Secondary | ICD-10-CM | POA: Insufficient documentation

## 2019-08-24 DIAGNOSIS — O26892 Other specified pregnancy related conditions, second trimester: Secondary | ICD-10-CM | POA: Diagnosis not present

## 2019-08-24 DIAGNOSIS — F319 Bipolar disorder, unspecified: Secondary | ICD-10-CM | POA: Insufficient documentation

## 2019-08-24 DIAGNOSIS — Z3A16 16 weeks gestation of pregnancy: Secondary | ICD-10-CM | POA: Diagnosis not present

## 2019-08-24 DIAGNOSIS — O99512 Diseases of the respiratory system complicating pregnancy, second trimester: Secondary | ICD-10-CM | POA: Insufficient documentation

## 2019-08-24 DIAGNOSIS — Z79899 Other long term (current) drug therapy: Secondary | ICD-10-CM | POA: Insufficient documentation

## 2019-08-24 DIAGNOSIS — R519 Headache, unspecified: Secondary | ICD-10-CM | POA: Insufficient documentation

## 2019-08-24 DIAGNOSIS — O99342 Other mental disorders complicating pregnancy, second trimester: Secondary | ICD-10-CM | POA: Diagnosis not present

## 2019-08-24 LAB — BASIC METABOLIC PANEL
Anion gap: 9 (ref 5–15)
BUN: 6 mg/dL (ref 6–20)
CO2: 20 mmol/L — ABNORMAL LOW (ref 22–32)
Calcium: 9.3 mg/dL (ref 8.9–10.3)
Chloride: 107 mmol/L (ref 98–111)
Creatinine, Ser: 0.61 mg/dL (ref 0.44–1.00)
GFR calc Af Amer: >60 mL/min (ref >60)
GFR calc non Af Amer: >60 mL/min (ref >60)
Glucose, Bld: 87 mg/dL (ref 70–99)
Potassium: 4 mmol/L (ref 3.5–5.1)
Sodium: 136 mmol/L (ref 135–145)

## 2019-08-24 LAB — HEMOGLOBIN AND HEMATOCRIT, BLOOD
HCT: 31.7 % — ABNORMAL LOW (ref 36.0–46.0)
Hemoglobin: 10.5 g/dL — ABNORMAL LOW (ref 12.0–15.0)

## 2019-08-24 NOTE — MAU Note (Signed)
Pt states that her head was feeling "stary" around 1445, but now it just hurts.   Pt states she was feeling disoriented at the time. Pt also reports blurred vision at the time.

## 2019-08-24 NOTE — Discharge Instructions (Signed)
Iron-Rich Diet  Iron is a mineral that helps your body to produce hemoglobin. Hemoglobin is a protein in red blood cells that carries oxygen to your body's tissues. Eating too little iron may cause you to feel weak and tired, and it can increase your risk of infection. Iron is naturally found in many foods, and many foods have iron added to them (iron-fortified foods). You may need to follow an iron-rich diet if you do not have enough iron in your body due to certain medical conditions. The amount of iron that you need each day depends on your age, your sex, and any medical conditions you have. Follow instructions from your health care provider or a diet and nutrition specialist (dietitian) about how much iron you should eat each day. What are tips for following this plan? Reading food labels  Check food labels to see how many milligrams (mg) of iron are in each serving. Cooking  Cook foods in pots and pans that are made from iron.  Take these steps to make it easier for your body to absorb iron from certain foods: ? Soak beans overnight before cooking. ? Soak whole grains overnight and drain them before using. ? Ferment flours before baking, such as by using yeast in bread dough. Meal planning  When you eat foods that contain iron, you should eat them with foods that are high in vitamin C. These include oranges, peppers, tomatoes, potatoes, and mango. Vitamin C helps your body to absorb iron. General information  Take iron supplements only as told by your health care provider. An overdose of iron can be life-threatening. If you were prescribed iron supplements, take them with orange juice or a vitamin C supplement.  When you eat iron-fortified foods or take an iron supplement, you should also eat foods that naturally contain iron, such as meat, poultry, and fish. Eating naturally iron-rich foods helps your body to absorb the iron that is added to other foods or contained in a  supplement.  Certain foods and drinks prevent your body from absorbing iron properly. Avoid eating these foods in the same meal as iron-rich foods or with iron supplements. These foods include: ? Coffee, black tea, and red wine. ? Milk, dairy products, and foods that are high in calcium. ? Beans and soybeans. ? Whole grains. What foods should I eat? Fruits Prunes. Raisins. Eat fruits high in vitamin C, such as oranges, grapefruits, and strawberries, alongside iron-rich foods. Vegetables Spinach (cooked). Green peas. Broccoli. Fermented vegetables. Eat vegetables high in vitamin C, such as leafy greens, potatoes, bell peppers, and tomatoes, alongside iron-rich foods. Grains Iron-fortified breakfast cereal. Iron-fortified whole-wheat bread. Enriched rice. Sprouted grains. Meats and other proteins Beef liver. Oysters. Beef. Shrimp. Turkey. Chicken. Tuna. Sardines. Chickpeas. Nuts. Tofu. Pumpkin seeds. Beverages Tomato juice. Fresh orange juice. Prune juice. Hibiscus tea. Fortified instant breakfast shakes. Sweets and desserts Blackstrap molasses. Seasonings and condiments Tahini. Fermented soy sauce. Other foods Wheat germ. The items listed above may not be a complete list of recommended foods and beverages. Contact a dietitian for more information. What foods should I avoid? Grains Whole grains. Bran cereal. Bran flour. Oats. Meats and other proteins Soybeans. Products made from soy protein. Black beans. Lentils. Mung beans. Split peas. Dairy Milk. Cream. Cheese. Yogurt. Cottage cheese. Beverages Coffee. Black tea. Red wine. Sweets and desserts Cocoa. Chocolate. Ice cream. Other foods Basil. Oregano. Large amounts of parsley. The items listed above may not be a complete list of foods and beverages to avoid.   Contact a dietitian for more information. Summary  Iron is a mineral that helps your body to produce hemoglobin. Hemoglobin is a protein in red blood cells that carries  oxygen to your body's tissues.  Iron is naturally found in many foods, and many foods have iron added to them (iron-fortified foods).  When you eat foods that contain iron, you should eat them with foods that are high in vitamin C. Vitamin C helps your body to absorb iron.  Certain foods and drinks prevent your body from absorbing iron properly, such as whole grains and dairy products. You should avoid eating these foods in the same meal as iron-rich foods or with iron supplements. This information is not intended to replace advice given to you by your health care provider. Make sure you discuss any questions you have with your health care provider. Document Revised: 06/06/2017 Document Reviewed: 05/20/2017 Elsevier Patient Education  2020 [employer].     Eating Plan for Pregnant Women While you are pregnant, your body requires additional nutrition to help support your growing baby. You also have a higher need for some vitamins and minerals, such as folic acid, calcium, iron, and vitamin D. Eating a healthy, well-balanced diet is very important for your health and your baby's health. Your need for extra calories varies for the three 27-month segments of your pregnancy (trimesters). For most women, it is recommended to consume:  150 extra calories a day during the first trimester.  300 extra calories a day during the second trimester.  300 extra calories a day during the third trimester. What are tips for following this plan?   Do not try to lose weight or go on a diet during pregnancy.  Limit your overall intake of foods that have "empty calories." These are foods that have little nutritional value, such as sweets, desserts, candies, and sugar-sweetened beverages.  Eat a variety of foods (especially fruits and vegetables) to get a full range of vitamins and minerals.  Take a prenatal vitamin to help meet your additional vitamin and mineral needs during pregnancy, specifically for  folic acid, iron, calcium, and vitamin D.  Remember to stay active. Ask your health care provider what types of exercise and activities are safe for you.  Practice good food safety and cleanliness. Wash your hands before you eat and after you prepare raw meat. Wash all fruits and vegetables well before peeling or eating. Taking these actions can help to prevent food-borne illnesses that can be very dangerous to your baby, such as listeriosis. Ask your health care provider for more information about listeriosis. What does 150 extra calories look like? Healthy options that provide 150 extra calories each day could be any of the following:  6-8 oz (170-230 g) of plain low-fat yogurt with  cup of berries.  1 apple with 2 teaspoons (11 g) of peanut butter.  Cut-up vegetables with  cup (60 g) of hummus.  8 oz (230 mL) or 1 cup of low-fat chocolate milk.  1 stick of string cheese with 1 medium orange.  1 peanut butter and jelly sandwich that is made with one slice of whole-wheat bread and 1 tsp (5 g) of peanut butter. For 300 extra calories, you could eat two of those healthy options each day. What is a healthy amount of weight to gain? The right amount of weight gain for you is based on your BMI before you became pregnant. If your BMI:  Was less than 18 (underweight), you should gain 28-40  lb (13-18 kg).  Was 18-24.9 (normal), you should gain 25-35 lb (11-16 kg).  Was 25-29.9 (overweight), you should gain 15-25 lb (7-11 kg).  Was 30 or greater (obese), you should gain 11-20 lb (5-9 kg). What if I am having twins or multiples? Generally, if you are carrying twins or multiples:  You may need to eat 300-600 extra calories a day.  The recommended range for total weight gain is 25-54 lb (11-25 kg), depending on your BMI before pregnancy.  Talk with your health care provider to find out about nutritional needs, weight gain, and exercise that is right for you. What foods can I  eat?  Fruits All fruits. Eat a variety of colors and types of fruit. Remember to wash your fruits well before peeling or eating. Vegetables All vegetables. Eat a variety of colors and types of vegetables. Remember to wash your vegetables well before peeling or eating. Grains All grains. Choose whole grains, such as whole-wheat bread, oatmeal, or brown rice. Meats and other protein foods Lean meats, including chicken, [place], fish, and lean cuts of beef, veal, or pork. If you eat fish or seafood, choose options that are higher in omega-3 fatty acids and lower in mercury, such as salmon, herring, mussels, trout, sardines, pollock, shrimp, crab, and lobster. Tofu. Tempeh. Beans. Eggs. Peanut butter and other nut butters. Make sure that all meats, poultry, and eggs are cooked to food-safe temperatures or "well-done." Two or more servings of fish are recommended each week in order to get the most benefits from omega-3 fatty acids that are found in seafood. Choose fish that are lower in mercury. You can find more information online:  PumpkinSearch.com.ee Dairy Pasteurized milk and milk alternatives (such as almond milk). Pasteurized yogurt and pasteurized cheese. Cottage cheese. Sour cream. Beverages Water. Juices that contain 100% fruit juice or vegetable juice. Caffeine-free teas and decaffeinated coffee. Drinks that contain caffeine are okay to drink, but it is better to avoid caffeine. Keep your total caffeine intake to less than 200 mg each day (which is 12 oz or 355 mL of coffee, tea, or soda) or the limit as told by your health care provider. Fats and oils Fats and oils are okay to include in moderation. Sweets and desserts Sweets and desserts are okay to include in moderation. Seasoning and other foods All pasteurized condiments. The items listed above may not be a complete list of foods and beverages you can eat. Contact a dietitian for more information. What foods are not  recommended? Fruits Unpasteurized fruit juices. Vegetables Raw (unpasteurized) vegetable juices. Meats and other protein foods Lunch meats, bologna, hot dogs, or other deli meats. (If you must eat those meats, reheat them until they are steaming hot.) Refrigerated pat, meat spreads from a meat counter, smoked seafood that is found in the refrigerated section of a store. Raw or undercooked meats, poultry, and eggs. Raw fish, such as sushi or sashimi. Fish that have high mercury content, such as tilefish, shark, swordfish, and king mackerel. To learn more about mercury in fish, talk with your health care provider or look for online resources, such as:  PumpkinSearch.com.ee Dairy Raw (unpasteurized) milk and any foods that have raw milk in them. Soft cheeses, such as feta, queso blanco, queso fresco, Brie, Camembert cheeses, blue-veined cheeses, and Panela cheese (unless it is made with pasteurized milk, which must be stated on the label). Beverages Alcohol. Sugar-sweetened beverages, such as sodas, teas, or energy drinks. Seasoning and other foods Homemade fermented foods and  drinks, such as pickles, sauerkraut, or kombucha drinks. (Store-bought pasteurized versions of these are okay.) Salads that are made in a store or deli, such as ham salad, chicken salad, egg salad, tuna salad, and seafood salad. The items listed above may not be a complete list of foods and beverages you should avoid. Contact a dietitian for more information. Where to find more information To calculate the number of calories you need based on your height, weight, and activity level, you can use an online calculator such as:  MobileTransition.ch To calculate how much weight you should gain during pregnancy, you can use an online pregnancy weight gain calculator such as:  StreamingFood.com.cy Summary  While you are pregnant, your body requires additional nutrition to help support  your growing baby.  Eat a variety of foods, especially fruits and vegetables to get a full range of vitamins and minerals.  Practice good food safety and cleanliness. Wash your hands before you eat and after you prepare raw meat. Wash all fruits and vegetables well before peeling or eating. Taking these actions can help to prevent food-borne illnesses, such as listeriosis, that can be very dangerous to your baby.  Do not eat raw meat or fish. Do not eat fish that have high mercury content, such as tilefish, shark, swordfish, and king mackerel. Do not eat unpasteurized (raw) dairy.  Take a prenatal vitamin to help meet your additional vitamin and mineral needs during pregnancy, specifically for folic acid, iron, calcium, and vitamin D. This information is not intended to replace advice given to you by your health care provider. Make sure you discuss any questions you have with your health care provider. Document Revised: 11/12/2018 Document Reviewed: 03/21/2017 Elsevier Patient Education  [place].

## 2019-08-24 NOTE — [facility] Provider Note (Signed)
Chief Complaint: Headache   First Provider Initiated Contact with Patient 08/24/19 1643     SUBJECTIVE HPI: Rose Schwartz is a 19 y.o. G1P0 at 89w3dwho presents to [facility] reporting headache & dizziness. Symptoms started around 3 pm today while at work. Initially had a frontal headache that felt like "stars" and she felt disoriented. Headache resolved as the day went on without any intervention. States she still feels dizzy/lightheaded. Denies LOC, head trauma, abdominal pain, vaginal bleeding, fever/chills, CP, SOB, palpitations, or n/v/d. No change in medications. Diet recall for today is an apple & banana for breakfast, broccoli & pulled pork for lunch, and a cup of apple juice to drink.      Past Medical History:  Diagnosis Date  . Asthma   . B[place] 1 disorder [place]   . UTI (urinary tract infection)    OB History  Gravida Para Term Preterm AB Living  1            SAB TAB Ectopic Multiple Live Births               # Outcome Date GA Lbr Len/2nd Weight Sex Delivery Anes PTL Lv  1 Current            Past Surgical History:  Procedure Laterality Date  . FETAL AMNIOTIC BAND RELEASE     Social History   Socioeconomic History  . Marital status: Single    Spouse name: Not on file  . Number of children: Not on file  . Years of education: Not on file  . Highest education level: Not on file  Occupational History  . Not on file  Tobacco Use  . Smoking status: Never Smoker  . Smokeless tobacco: Never Used  Substance and Sexual Activity  . Alcohol use: Not Currently    Comment: SOCIAL  . Drug use: Not Currently    Types: Marijuana    Comment: few years ago  . Sexual activity: Yes    Birth control/protection: None  Other Topics Concern  . Not on file  Social History Narrative  . Not on file   Social Determinants of Health   Financial Resource Strain:   . Difficulty of Paying Living Expenses: Not on file  Food Insecurity: Food Insecurity Present  .  Worried About RCharity fundraiserin the Last Year: Sometimes true  . Ran Out of Food in the Last Year: Sometimes true  Transportation Needs: No Transportation Needs  . Lack of Transportation (Medical): No  . Lack of Transportation (Non-Medical): No  Physical Activity:   . Days of Exercise per Week: Not on file  . Minutes of Exercise per Session: Not on file  Stress:   . Feeling of Stress : Not on file  Social Connections:   . Frequency of Communication with Friends and Family: Not on file  . Frequency of Social Gatherings with Friends and Family: Not on file  . Attends Religious Services: Not on file  . Active Member of Clubs or Organizations: Not on file  . Attends CArchivistMeetings: Not on file  . Marital Status: Not on file  Intimate Partner Violence:   . Fear of Current or Ex-Partner: Not on file  . Emotionally Abused: Not on file  . Physically Abused: Not on file  . Sexually Abused: Not on file   Family History  Problem Relation Age of Onset  . Fibromyalgia Mother    No current facility-administered medications on file prior  to encounter.   Current Outpatient Medications on File Prior to Encounter  Medication Sig Dispense Refill  . cyclobenzaprine (FLEXERIL) 10 MG tablet Take 1 tablet (10 mg total) by mouth at bedtime as needed for muscle spasms. 30 tablet 2  . Prenatal Vit-Fe Fumarate-FA (PREPLUS) 27-1 MG TABS Take 1 tablet by mouth daily. 30 tablet 6  . albuterol (VENTOLIN HFA) 108 (90 Base) MCG/ACT inhaler Inhale 2 puffs into the lungs every 4 (four) hours as needed for wheezing or shortness of breath (cough, shortness of breath or wheezing.). 1 Inhaler 1  . Blood Pressure Monitoring (BLOOD PRESSURE KIT) DEVI 1 Device by Does not apply route as needed. 1 each 0  . EPINEPHrine 0.3 mg/0.3 mL IJ SOAJ injection AS DIRECTED AS NEEDED FOR SYSTEMIC REACTIONS INJECTION    . Prenatal MV-Min-FA-Omega-3 (PRENATAL GUMMIES/DHA & FA) 0.4-32.5 MG CHEW Chew 2 each by mouth.      Allergies  Allergen Reactions  . Other     Tree nuts    I have reviewed patient's Past Medical Hx, Surgical Hx, Family Hx, Social Hx, medications and allergies.   Review of Systems  Constitutional: Negative.   Respiratory: Negative.   Cardiovascular: Negative.   Gastrointestinal: Negative.   Genitourinary: Negative.   Neurological: Positive for dizziness, light-headedness and headaches.    OBJECTIVE Patient Vitals for the past 24 hrs:  BP Temp Temp src Pulse Resp SpO2 Weight  08/24/19 1624 116/65 98.1 F (36.7 C) Oral (!) 102 16 100 % --  08/24/19 1500 -- -- -- -- -- -- 60.7 kg   Constitutional: Well-developed, well-nourished female in no acute distress.  Cardiovascular: normal rate & rhythm, no murmur Respiratory: normal rate and effort. Lung sounds clear throughout GI: Abd soft, non-tender, Pos BS x 4. No guarding or rebound tenderness MS: Extremities nontender, no edema, normal ROM Neurologic: Alert and oriented x 4.   LAB RESULTS Results for orders placed or performed during the hospital encounter of 08/24/19 (from the past 24 hour(s))  Hemoglobin and hematocrit, blood     Status: Abnormal   Collection Time: 08/24/19  5:02 PM  Result Value Ref Range   Hemoglobin 10.5 (L) 12.0 - 15.0 g/dL   HCT 31.7 (L) 36.0 - 16.1 %  Basic metabolic panel     Status: Abnormal   Collection Time: 08/24/19  5:02 PM  Result Value Ref Range   Sodium 136 135 - 145 mmol/L   Potassium 4.0 3.5 - 5.1 mmol/L   Chloride 107 98 - 111 mmol/L   CO2 20 (L) 22 - 32 mmol/L   Glucose, Bld 87 70 - 99 mg/dL   BUN 6 6 - 20 mg/dL   Creatinine, Ser 0.61 0.44 - 1.00 mg/dL   Calcium 9.3 8.9 - 10.3 mg/dL   GFR calc non Af Amer >60 >60 mL/min   GFR calc Af Amer >60 >60 mL/min   Anion gap 9 5 - 15    IMAGING No results found.  MAU COURSE Orders Placed This Encounter  Procedures  . Hemoglobin and hematocrit, blood  . Basic metabolic panel  . Orthostatic vital signs  . Discharge patient   No  orders of the defined types were placed in this encounter.   MDM FHT present via doppler  Orthostatic vital signs normal H/H, hemoglobin 10.5 - discussed treatment of anemia in pregnancy with patient BMP normal  ASSESSMENT 1. Pregnancy headache in second trimester   2. [redacted] weeks gestation of pregnancy     PLAN Discharge  home in stable condition. Discussed reasons to return to [facility] Discussed pregnancy diet  Allergies as of 08/24/2019      Reactions   Other    Tree nuts      Medication List    TAKE these medications   albuterol 108 (90 Base) MCG/ACT inhaler Commonly known as: VENTOLIN HFA Inhale 2 puffs into the lungs every 4 (four) hours as needed for wheezing or shortness of breath (cough, shortness of breath or wheezing.).   Blood Pressure Kit Devi 1 Device by Does not apply route as needed.   cyclobenzaprine 10 MG tablet Commonly known as: FLEXERIL Take 1 tablet (10 mg total) by mouth at bedtime as needed for muscle spasms.   EPINEPHrine 0.3 mg/0.3 mL Soaj injection Commonly known as: EPI-PEN AS DIRECTED AS NEEDED FOR SYSTEMIC REACTIONS INJECTION   Prenatal Gummies/DHA & FA 0.4-32.5 MG Chew Chew 2 each by mouth.   PrePLUS 27-1 MG Tabs Take 1 tablet by mouth daily.        Jorje Guild, NP 08/24/2019  5:54 PM

## 2019-09-10 ENCOUNTER — Ambulatory Visit (HOSPITAL_COMMUNITY)
Admission: RE | Admit: 2019-09-10 | Payer: Medicaid Other | Source: Ambulatory Visit | Attending: Obstetrics and Gynecology | Admitting: Obstetrics and Gynecology

## 2019-09-15 ENCOUNTER — Ambulatory Visit (HOSPITAL_COMMUNITY)
Admission: RE | Admit: 2019-09-15 | Discharge: 2019-09-15 | Disposition: A | Discharge status: Home / Self Care | Payer: Medicaid Other | Source: Ambulatory Visit | Attending: Obstetrics and Gynecology | Admitting: Obstetrics and Gynecology

## 2019-09-15 ENCOUNTER — Other Ambulatory Visit: Payer: Self-pay | Admitting: Obstetrics and Gynecology

## 2019-09-15 ENCOUNTER — Other Ambulatory Visit: Payer: Self-pay

## 2019-09-15 DIAGNOSIS — Z3A19 19 weeks gestation of pregnancy: Secondary | ICD-10-CM

## 2019-09-15 DIAGNOSIS — Z363 Encounter for antenatal screening for malformations: Secondary | ICD-10-CM | POA: Diagnosis not present

## 2019-09-15 DIAGNOSIS — Z148 Genetic carrier of other disease: Secondary | ICD-10-CM | POA: Diagnosis not present

## 2019-09-15 DIAGNOSIS — Z87798 Personal history of other (corrected) congenital malformations: Secondary | ICD-10-CM | POA: Diagnosis not present

## 2019-09-15 DIAGNOSIS — Z3490 Encounter for supervision of normal pregnancy, unspecified, unspecified trimester: Secondary | ICD-10-CM

## 2019-09-21 ENCOUNTER — Other Ambulatory Visit: Payer: Self-pay

## 2019-09-21 ENCOUNTER — Encounter: Payer: Medicaid Other | Admitting: Women's Health

## 2019-09-21 ENCOUNTER — Inpatient Hospital Stay (HOSPITAL_COMMUNITY)
Admission: AD | Admit: 2019-09-21 | Discharge: 2019-09-21 | Disposition: A | Discharge status: Home / Self Care | Payer: Medicaid Other | Attending: Obstetrics & Gynecology | Admitting: Obstetrics & Gynecology

## 2019-09-21 ENCOUNTER — Encounter (HOSPITAL_COMMUNITY): Payer: Self-pay | Admitting: Obstetrics & Gynecology

## 2019-09-21 DIAGNOSIS — O2692 Pregnancy related conditions, unspecified, second trimester: Secondary | ICD-10-CM

## 2019-09-21 DIAGNOSIS — Z3492 Encounter for supervision of normal pregnancy, unspecified, second trimester: Secondary | ICD-10-CM

## 2019-09-21 DIAGNOSIS — Z3A20 20 weeks gestation of pregnancy: Secondary | ICD-10-CM | POA: Insufficient documentation

## 2019-09-21 DIAGNOSIS — J45909 Unspecified asthma, uncomplicated: Secondary | ICD-10-CM | POA: Insufficient documentation

## 2019-09-21 DIAGNOSIS — Z3A16 16 weeks gestation of pregnancy: Secondary | ICD-10-CM | POA: Diagnosis not present

## 2019-09-21 DIAGNOSIS — O26892 Other specified pregnancy related conditions, second trimester: Secondary | ICD-10-CM | POA: Diagnosis not present

## 2019-09-21 DIAGNOSIS — O99512 Diseases of the respiratory system complicating pregnancy, second trimester: Secondary | ICD-10-CM | POA: Insufficient documentation

## 2019-09-21 DIAGNOSIS — R042 Hemoptysis: Secondary | ICD-10-CM

## 2019-09-21 LAB — URINALYSIS, ROUTINE W REFLEX MICROSCOPIC
Bilirubin Urine: NEGATIVE
Glucose, UA: NEGATIVE mg/dL
Hgb urine dipstick: NEGATIVE
Ketones, ur: NEGATIVE mg/dL
Nitrite: NEGATIVE
Protein, ur: NEGATIVE mg/dL
Specific Gravity, Urine: 1.019 (ref 1.005–1.030)
pH: 7 (ref 5.0–8.0)

## 2019-09-21 LAB — COMPREHENSIVE METABOLIC PANEL
ALT: 14 U/L (ref 0–44)
AST: 17 U/L (ref 15–41)
Albumin: 3.1 g/dL — ABNORMAL LOW (ref 3.5–5.0)
Alkaline Phosphatase: 61 U/L (ref 38–126)
Anion gap: 8 (ref 5–15)
BUN: 8 mg/dL (ref 6–20)
CO2: 21 mmol/L — ABNORMAL LOW (ref 22–32)
Calcium: 8.9 mg/dL (ref 8.9–10.3)
Chloride: 106 mmol/L (ref 98–111)
Creatinine, Ser: 0.54 mg/dL (ref 0.44–1.00)
GFR calc Af Amer: >60 mL/min (ref >60)
GFR calc non Af Amer: >60 mL/min (ref >60)
Glucose, Bld: 89 mg/dL (ref 70–99)
Potassium: 4 mmol/L (ref 3.5–5.1)
Sodium: 135 mmol/L (ref 135–145)
Total Bilirubin: 0.6 mg/dL (ref 0.3–1.2)
Total Protein: 6.5 g/dL (ref 6.5–8.1)

## 2019-09-21 LAB — CBC
HCT: 30.8 % — ABNORMAL LOW (ref 36.0–46.0)
Hemoglobin: 10.2 g/dL — ABNORMAL LOW (ref 12.0–15.0)
MCH: 28 pg (ref 26.0–34.0)
MCHC: 33.1 g/dL (ref 30.0–36.0)
MCV: 84.6 fL (ref 80.0–100.0)
Platelets: 252 10*3/uL (ref 150–400)
RBC: 3.64 MIL/uL — ABNORMAL LOW (ref 3.87–5.11)
RDW: 13 % (ref 11.5–15.5)
WBC: 11.2 10*3/uL — ABNORMAL HIGH (ref 4.0–10.5)
nRBC: 0 % (ref 0.0–0.2)

## 2019-09-21 MED ORDER — ONDANSETRON 4 MG PO TBDP
4.0000 mg | ORAL_TABLET | Freq: Once | ORAL | Status: AC
  Administered 2019-09-21: 4 mg via ORAL
  Filled 2019-09-21: qty 1

## 2019-09-21 MED ORDER — CETIRIZINE-PSEUDOEPHEDRINE ER 5-120 MG PO TB12: 1.0000 | ORAL_TABLET | Freq: Every day | ORAL | 0 refills | Status: DC

## 2019-09-21 NOTE — MAU Provider Note (Signed)
History     CSN: [id number]  Arrival date and time: 09/21/19 1102   First Provider Initiated Contact with Patient 09/21/19 1155      Chief Complaint  Patient presents with  . spitting up blood   HPI Rose Schwartz is a 19 y.o. G1P0 at 81w3dwho presents to MAU with chief complaint of blood-tinged saliva. This is a recurrent problem, onset one month ago. Patient states she had an asthma flare yesterday due to increasing pollen and was coughing more than normal. She endorses seeing more blood-tinged saliva than normal this morning. She endorses intermittent nausea without vomiting.  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 She endorses recurrent muscle pain which she associates with repetitive lifting and twisting in her work with children.  She denies lower abdominal pain, low back pain, vaginal bleeding, abdominal tenderness, dysuria, fever or recent illness.  [facility] and her next appointment is 09/29/2019.  OB History    Gravida  1   Para      Term      Preterm      AB      Living        SAB      TAB      Ectopic      Multiple      Live Births              Past Medical History:  Diagnosis Date  . Asthma   . Bipolar 1 disorder ([place]   . UTI (urinary tract infection)     Past Surgical History:  Procedure Laterality Date  . FETAL AMNIOTIC BAND RELEASE      Family History  Problem Relation Age of Onset  . Fibromyalgia Mother     Social History   Tobacco Use  . Smoking status: Never Smoker  . Smokeless tobacco: Never Used  Substance Use Topics  . Alcohol use: Not Currently    Comment: SOCIAL  . Drug use: Not Currently    Types: Marijuana    Comment: few years ago    Allergies:  Allergies  Allergen Reactions  . Other     Tree nuts    Medications Prior to Admission  Medication Sig Dispense Refill Last Dose  . albuterol (VENTOLIN HFA) 108 (90 Base)  MCG/ACT inhaler Inhale 2 puffs into the lungs every 4 (four) hours as needed for wheezing or shortness of breath (cough, shortness of breath or wheezing.). 1 Inhaler 1   . Blood Pressure Monitoring (BLOOD PRESSURE KIT) DEVI 1 Device by Does not apply route as needed. 1 each 0   . cyclobenzaprine (FLEXERIL) 10 MG tablet Take 1 tablet (10 mg total) by mouth at bedtime as needed for muscle spasms. 30 tablet 2   . EPINEPHrine 0.3 mg/0.3 mL IJ SOAJ injection AS DIRECTED AS NEEDED FOR SYSTEMIC REACTIONS INJECTION     . Prenatal MV-Min-FA-Omega-3 (PRENATAL GUMMIES/DHA & FA) 0.4-32.5 MG CHEW Chew 2 each by mouth.     . Prenatal Vit-Fe Fumarate-FA (PREPLUS) 27-1 MG TABS Take 1 tablet by mouth daily. 30 tablet 6     Review of Systems  Constitutional: Positive for fatigue. Negative for chills and fever.  HENT: Positive for congestion.   Respiratory: Positive for cough.   Gastrointestinal: Negative for abdominal pain.  Musculoskeletal: Negative for back pain.  All other systems reviewed and are negative.  Physical Exam   Blood pressure  105/70, pulse (!) 103, temperature 98.5 F (36.9 C), temperature source Oral, resp. rate 18, height '5\' 1"'  (1.549 m), weight 62.3 kg, last menstrual period 04/22/2019, SpO2 99 %.  Physical Exam  Nursing note and vitals reviewed. Constitutional: She is oriented to person, place, and time. She appears well-developed and well-nourished.  HENT:  Mouth/Throat: Uvula is midline, oropharynx is clear and moist and mucous membranes are normal.  Cardiovascular: Normal rate and normal heart sounds.  Respiratory: Effort normal and breath sounds normal.  GI: Soft. She exhibits no distension. There is no abdominal tenderness. There is no rebound and no guarding.  Genitourinary:    Genitourinary Comments: Deferred due to chief complaint   Musculoskeletal:        General: Normal range of motion.  Neurological: She is alert and oriented to person, place, and time.  Skin: Skin is  warm and dry.  Psychiatric: She has a normal mood and affect. Her behavior is normal. Judgment and thought content normal.    MAU Course  Procedures   --Discussed hemoptysis as possible outcome of recurrent coughing yesterday. Reviewed interventions to minimize irritation  Patient Vitals for the past 24 hrs:  BP Temp Temp src Pulse Resp SpO2 Height Weight  09/21/19 1349 111/68 -- -- 93 16 -- -- --  09/21/19 1134 105/70 98.5 F (36.9 C) Oral (!) 103 18 99 % '5\' 1"'  (1.549 m) 62.3 kg   Results for orders placed or performed during the hospital encounter of 09/21/19 (from the past 24 hour(s))  CBC     Status: Abnormal   Collection Time: 09/21/19 12:05 PM  Result Value Ref Range   WBC 11.2 (H) 4.0 - 10.5 K/uL   RBC 3.64 (L) 3.87 - 5.11 MIL/uL   Hemoglobin 10.2 (L) 12.0 - 15.0 g/dL   HCT 30.8 (L) 36.0 - 46.0 %   MCV 84.6 80.0 - 100.0 fL   MCH 28.0 26.0 - 34.0 pg   MCHC 33.1 30.0 - 36.0 g/dL   RDW 13.0 11.5 - 15.5 %   Platelets 252 150 - 400 K/uL   nRBC 0.0 0.0 - 0.2 %  Comprehensive metabolic panel     Status: Abnormal   Collection Time: 09/21/19 12:05 PM  Result Value Ref Range   Sodium 135 135 - 145 mmol/L   Potassium 4.0 3.5 - 5.1 mmol/L   Chloride 106 98 - 111 mmol/L   CO2 21 (L) 22 - 32 mmol/L   Glucose, Bld 89 70 - 99 mg/dL   BUN 8 6 - 20 mg/dL   Creatinine, Ser 0.54 0.44 - 1.00 mg/dL   Calcium 8.9 8.9 - 10.3 mg/dL   Total Protein 6.5 6.5 - 8.1 g/dL   Albumin 3.1 (L) 3.5 - 5.0 g/dL   AST 17 15 - 41 U/L   ALT 14 0 - 44 U/L   Alkaline Phosphatase 61 38 - 126 U/L   Total Bilirubin 0.6 0.3 - 1.2 mg/dL   GFR calc non Af Amer >60 >60 mL/min   GFR calc Af Amer >60 >60 mL/min   Anion gap 8 5 - 15  Urinalysis, Routine w reflex microscopic     Status: Abnormal   Collection Time: 09/21/19 12:37 PM  Result Value Ref Range   Color, Urine YELLOW YELLOW   APPearance HAZY (A) CLEAR   Specific Gravity, Urine 1.019 1.005 - 1.030   pH 7.0 5.0 - 8.0   Glucose, UA NEGATIVE NEGATIVE  mg/dL   Hgb urine dipstick NEGATIVE NEGATIVE  Bilirubin Urine NEGATIVE NEGATIVE   Ketones, ur NEGATIVE NEGATIVE mg/dL   Protein, ur NEGATIVE NEGATIVE mg/dL   Nitrite NEGATIVE NEGATIVE   Leukocytes,Ua SMALL (A) NEGATIVE   RBC / HPF 0-5 0 - 5 RBC/hpf   WBC, UA 0-5 0 - 5 WBC/hpf   Bacteria, UA RARE (A) NONE SEEN   Squamous Epithelial / LPF 6-10 0 - 5   Mucus PRESENT    Meds ordered this encounter  Medications  . ondansetron (ZOFRAN-ODT) disintegrating tablet 4 mg  . cetirizine-pseudoephedrine (ZYRTEC-D) 5-120 MG tablet    Sig: Take 1 tablet by mouth daily.    Dispense:  30 tablet    Refill:  0    Order Specific Question:   Supervising Provider    Answer:   Woodroe Mode [[id number]]   Assessment and Plan  --19 y.o. G1P0 at [redacted]w[redacted]d --FHT 159 by Doppler --No concerning findings on physical exam --Discharge home in stable condition  F/U: --[facility] 09/29/2019  SDarlina Rumpf CSouth [place]3/16/2021, 3:41 PM

## 2019-09-21 NOTE — MAU Note (Signed)
"  Was spitting up blood this morning, not vomiting. Thought it was just flem, but there was a clot in it.  Has been happening (traces of blood when she spits), but this seemed worse, so thought she should get checked out. Stomach is kind of hurting, thinks she needs to poop.".  Denies sore throat.

## 2019-09-21 NOTE — Progress Notes (Deleted)
Subjective:  Rose Schwartz is a 19 y.o. G1P0 at [redacted]w[redacted]d being seen today for ongoing prenatal care.  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  Patient reports {sx:14538}.   .  .   . Denies leaking of fluid.   The following portions of the patient's history were reviewed and updated as appropriate: allergies, current medications, past family history, past medical history, past social history, past surgical history and problem list. Problem list updated.  Objective:  There were no vitals filed for this visit.  Fetal Status:           General:  Alert, oriented and cooperative. Patient is in no acute distress.  Skin: Skin is warm and dry. No rash noted.   Cardiovascular: Normal heart rate noted  Respiratory: Normal respiratory effort, no problems with respiration noted  Abdomen: Soft, gravid, appropriate for gestational age.       Pelvic:       {Blank single:19197::"Cervical exam performed","Cervical exam deferred"}        Extremities: Normal range of motion.     Mental Status: Normal mood and affect. Normal behavior. Normal judgment and thought content.   Urinalysis:      Assessment and Plan:  Pregnancy: G1P0 at [redacted]w[redacted]d  1. Encounter for supervision of low-risk pregnancy in second trimester *** -discussed contraception, pt elects ***, information given -peds list given -AFP today -pt picked up BP cuff in office today -last OB visit 07/15/2019 for NOB, no visits since  2. Mild intermittent asthma without complication *** -pt reports has inhaler at home that is unexpired and uses *** times per week  3. Bipolar 1 disorder (HCC) *** -pt missed appt with Marijean Niemann on 08/16/2019, needs to be rescheduled  4. Alpha thalassemia silent carrier *** -genetic  counseling appt scheduled 09/22/2019, pt aware  Preterm labor symptoms and general obstetric precautions including but not limited to vaginal bleeding, contractions, leaking of fluid and fetal movement were reviewed in detail with the patient. I discussed the assessment and treatment plan with the patient. The patient was provided an opportunity to ask questions and all were answered. The patient agreed with the plan and demonstrated an understanding of the instructions. The patient was advised to call back or seek an in-person office evaluation/go to MAU at [facility] for any urgent or concerning symptoms. Please refer to After Visit Summary for other counseling recommendations.  No follow-ups on file.   Birdena Kingma, Odie Sera, NP

## 2019-09-21 NOTE — Discharge Instructions (Signed)

## 2019-09-22 ENCOUNTER — Ambulatory Visit (HOSPITAL_COMMUNITY): Payer: Medicaid Other | Attending: Obstetrics and Gynecology

## 2019-09-22 ENCOUNTER — Encounter (HOSPITAL_COMMUNITY): Payer: Self-pay

## 2019-09-29 ENCOUNTER — Encounter: Payer: Medicaid Other | Admitting: Medical

## 2019-09-30 ENCOUNTER — Telehealth (INDEPENDENT_AMBULATORY_CARE_PROVIDER_SITE_OTHER): Payer: Medicaid Other | Admitting: Obstetrics and Gynecology

## 2019-09-30 DIAGNOSIS — Z20828 Contact with and (suspected) exposure to other viral communicable diseases: Secondary | ICD-10-CM | POA: Diagnosis not present

## 2019-09-30 DIAGNOSIS — Z3492 Encounter for supervision of normal pregnancy, unspecified, second trimester: Secondary | ICD-10-CM

## 2019-09-30 DIAGNOSIS — Z3A21 21 weeks gestation of pregnancy: Secondary | ICD-10-CM

## 2019-09-30 MED ORDER — CETIRIZINE HCL 10 MG PO TABS: 10.0000 mg | ORAL_TABLET | Freq: Every day | ORAL | 1 refills | Status: DC

## 2019-09-30 NOTE — Progress Notes (Signed)
   TELEHEALTH VIRTUAL OBSTETRICS VISIT ENCOUNTER NOTE  I connected with Rose Schwartz on 09/30/19 at  1:55 PM EDT by telephone at home and verified that I am speaking with the correct person using two identifiers.   I discussed the limitations, risks, security and privacy concerns of performing an evaluation and management service by telephone and the availability of in person appointments. I also discussed with the patient that there may be a patient responsible charge related to this service. The patient expressed understanding and agreed to proceed.  Subjective:  Rose Schwartz is a 19 y.o. G1P0 at [redacted]w[redacted]d being followed for ongoing prenatal care.  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  Patient reports no complaints. Reports fetal movement. Denies any contractions, bleeding or leaking of fluid.   The following portions of the patient's history were reviewed and updated as appropriate: allergies, current medications, past family history, past medical history, past social history, past surgical history and problem list.   Objective:   General:  Alert, oriented and cooperative.   Mental Status: Normal mood and affect perceived. Normal judgment and thought content.  Rest of physical exam deferred due to type of encounter  Assessment and Plan:  Pregnancy: G1P0 at [redacted]w[redacted]d 1. Encounter for supervision of low-risk pregnancy in second trimester  Social worker next visit.  BP 119/74 Concerned about pots, strong family history.  Had to do phone visit, video visit not working.   Preterm labor symptoms and general obstetric precautions including but not limited to vaginal bleeding, contractions, leaking of fluid and fetal movement were reviewed in detail with  the patient.  I discussed the assessment and treatment plan with the patient. The patient was provided an opportunity to ask questions and all were answered. The patient agreed with the plan and demonstrated an understanding of the instructions. The patient was advised to call back or seek an in-person office evaluation/go to MAU at [facility] for any urgent or concerning symptoms. Please refer to After Visit Summary for other counseling recommendations.   I provided 10 minutes of non-face-to-face time during this encounter.  No follow-ups on file.  No future appointments.  Venia Carbon, NP Center for [employer], [facility]

## 2019-09-30 NOTE — Progress Notes (Signed)
I connected with  Rose Schwartz on 09/30/19 at 1351 by telephone and verified that I am speaking with the correct person using two identifiers.   I discussed the limitations, risks, security and privacy concerns of performing an evaluation and management service by telephone and the availability of in person appointments. I also discussed with the patient that there may be a patient responsible charge related to this service. The patient expressed understanding and agreed to proceed.  Marjo Bicker, RN 09/30/2019  1:51 PM

## 2019-10-04 DIAGNOSIS — Z20828 Contact with and (suspected) exposure to other viral communicable diseases: Secondary | ICD-10-CM | POA: Diagnosis not present

## 2019-10-11 NOTE — BH Specialist Note (Addendum)
Integrated Behavioral Health via Telemedicine Video Visit  10/11/2019 NEGIN HEGG [id number]  Number of [place] visits: 3 Session Start time: 1:59  Session End time: 2:33 Total time: 85  Referring Provider: Noni Saupe, NP Type of Visit: Video Patient/Family location: Home [facility] Provider location: [facility] All persons participating in visit: [name] and [place]    Confirmed patient's address: Yes  Confirmed patient's phone number: Yes  Any changes to demographics: No   Confirmed patient's insurance: Yes  Any changes to patient's insurance: No   Discussed confidentiality: at previous visit  I connected with [name] by a video enabled telemedicine application and verified that I am speaking with the correct person using two identifiers.     I discussed the limitations of evaluation and management by telemedicine and the availability of in person appointments.  I discussed that the purpose of this visit is to provide behavioral health care while limiting exposure to the novel coronavirus.   Discussed there is a possibility of technology failure and discussed alternative modes of communication if that failure occurs.  I discussed that engaging in this video visit, they consent to the provision of behavioral healthcare and the services will be billed under their insurance.  Patient and/or legal guardian expressed understanding and consented to video visit: Yes   PRESENTING CONCERNS: Patient and/or family reports the following symptoms/concerns: Pt states her primary concern for the past 5 days is  feeling lightheaded, chest pressure when she eats, using her inhaler for asthma more frequently, along with stomach pains the past two nights. Pt feels less anxious after making a recent big decision, and is noticing more vivid dreams that may be impacting her sleep.  Duration of problem: Current pregnancy; Severity  of problem: moderate  STRENGTHS (Protective Factors/Coping Skills): Hopeful outlook, open to learn and supportive FOB  GOALS ADDRESSED: Patient will: 1.  Reduce symptoms of: mood instability  2.  Increase knowledge and/or ability of: healthy habits  3.  Demonstrate ability to: Increase healthy adjustment to current life circumstances  INTERVENTIONS: Interventions utilized:  Motivational Interviewing, Sleep Hygiene and Psychoeducation and/or Health Education Standardized Assessments completed: GAD-7 and PHQ 9  ASSESSMENT: Patient currently experiencing Bipolar 1 disorder and Psychosocial stress.   Patient may benefit from continued psychoeducation and brief therapeutic interventions regarding coping with symptoms of depression, anxiety .  PLAN: 1. Follow up with behavioral health clinician on : Two weeks 2. Behavioral recommendations:  -Begin a dream journal, writing down the vivid dreams upon waking, in as much detail as able to recall -Continue taking prenatal vitamin daily  3. Referral(s): [place] (In Clinic)  I discussed the assessment and treatment plan with the patient and/or parent/guardian. They were provided an opportunity to ask questions and all were answered. They agreed with the plan and demonstrated an understanding of the instructions.   They were advised to call back or seek an in-person evaluation if the symptoms worsen or if the condition fails to improve as anticipated.  Caroleen Hamman Ladan Vanderzanden  Depression screen [facility] 2/9 10/14/2019 09/30/2019 07/14/2019  Decreased Interest 1 0 2  Down, Depressed, Hopeless 1 0 1  PHQ - 2 Score 2 0 3  Altered sleeping 3 2 1   Tired, decreased energy 2 3 3   Change in appetite 1 3 3   Feeling bad or failure about yourself  1 0 1  Trouble concentrating 2 1 1   Moving slowly or fidgety/restless 0 1 0  Suicidal thoughts 0 0 0  PHQ-9 Score 11 10 12    GAD 7 : Generalized Anxiety Score 10/14/2019 09/30/2019 07/14/2019   Nervous, Anxious, on Edge 1 3 1   Control/stop worrying 1 3 1   Worry too much - different things 1 3 1   Trouble relaxing 1 3 1   Restless 0 0 1  Easily annoyed or irritable 2 3 1   Afraid - awful might happen 1 1 1   Total GAD 7 Score 7 16 7

## 2019-10-14 ENCOUNTER — Ambulatory Visit (INDEPENDENT_AMBULATORY_CARE_PROVIDER_SITE_OTHER): Payer: Medicaid Other | Admitting: Clinical

## 2019-10-14 DIAGNOSIS — Z658 Other specified problems related to psychosocial circumstances: Secondary | ICD-10-CM

## 2019-10-14 DIAGNOSIS — F319 Bipolar disorder, unspecified: Secondary | ICD-10-CM | POA: Diagnosis not present

## 2019-10-15 ENCOUNTER — Telehealth: Payer: Self-pay | Admitting: General Practice

## 2019-10-15 NOTE — Telephone Encounter (Signed)
-----   Message from Rae Lips, [place] sent at 10/14/2019  2:34 PM EDT ----- Pt would like a nurse call-back: for the past 5 days, has felt lightheaded, feels pressure in her chest when she eats, harder to breathe, feels her asthma has gotten worse(using her inhaler more frequently), and for the past two nights, has had stomach pains. Please advise.

## 2019-10-15 NOTE — Telephone Encounter (Signed)
Called patient, no answer- left message stating we were calling to check in on her as we heard she hasn't been feeling well & had some concerns. Advised she call us back if she still needs assistance or if it is an emergency please go to MAU or urgent care over the weekend.

## 2019-10-21 NOTE — BH Specialist Note (Signed)
Integrated Behavioral Health via Telemedicine Phone Visit  10/21/2019 Rose Schwartz [id number]  Number of Integrated Behavioral Health visits: 4 Session Start time: 1:27  Session End time: 1:43 Total time: 16  Referring Provider: Venia Carbon, NP Type of Visit: Phone Particiipating in visit: [name] and [facility] Rose Schwartz    Confirmed patient's address: Yes  Confirmed patient's phone number: Yes  Any changes to demographics: No   Confirmed patient's insurance: Yes  Any changes to patient's insurance: No   Discussed confidentiality: Yes   I connected with [name] a video enabled telemedicine application and verified that I am speaking with the correct person using two identifiers.     I discussed the limitations of evaluation and management by telemedicine and the availability of in person appointments.  I discussed that the purpose of this visit is to provide behavioral health care while limiting exposure to the novel coronavirus.   Discussed there is a possibility of technology failure and discussed alternative modes of communication if that failure occurs.  I discussed that engaging in this video visit, they consent to the provision of behavioral healthcare and the services will be billed under their insurance.  Patient and/or legal guardian expressed understanding and consented to video visit: Yes   PRESENTING CONCERNS: Patient and/or family reports the following symptoms/concerns: Pt states her primary concern today is having "dizzy spells, light-headed"; says she is no longer feeling symptoms at last visit (chest hurting, hard to breathe, that she attributed to asthma). Pt missed her last medical appointment and requests new visit, along with f/u with [facility] to discuss recent stressful life events. Duration of problem: Current pregnancy; Severity of problem: Undetermined  STRENGTHS (Protective Factors/Coping Skills): Hopeful  outlook, supportive FOB, and open to learn   GOALS ADDRESSED: Patient will: 1.  Reduce symptoms of: mood instability and stress  2.  Increase knowledge and/or ability of: stress reduction  3.  Demonstrate ability to: Increase motivation to adhere to plan of care  INTERVENTIONS: Interventions utilized:  Motivational Interviewing Standardized Assessments completed: Not given today  ASSESSMENT: Patient currently experiencing Bipolar 1 disorder and Psychosocial stress.   Patient may benefit from continued psychoeducation and brief therapeutic interventions regarding coping with symptoms of current life stress today .  PLAN: 1. Follow up with behavioral health clinician on : One week 2. Behavioral recommendations:  -Answer phone when clinic calls to set up medical appointment today -Continue taking prenatal vitamin daily -Begin dream journal: Write down dreams upon waking in journal. (Will discuss at follow up visit) 3. Referral(s): Integrated [employer] (In Clinic)  I discussed the assessment and treatment plan with the patient and/or parent/guardian. They were provided an opportunity to ask questions and all were answered. They agreed with the plan and demonstrated an understanding of the instructions.   They were advised to call back or seek an in-person evaluation if the symptoms worsen or if the condition fails to improve as anticipated.  Valetta Close [name]

## 2019-10-28 ENCOUNTER — Telehealth (INDEPENDENT_AMBULATORY_CARE_PROVIDER_SITE_OTHER): Payer: Medicaid Other | Admitting: Obstetrics and Gynecology

## 2019-10-28 NOTE — Progress Notes (Signed)
@  412pm LVM for patient to be avaliable by phone will attempt to call back in 7-10 mins @423pm  lvm that this was my last attempt to call and for her to call the office to reschedule.

## 2019-10-29 NOTE — Progress Notes (Signed)
Patient did not keep her appointment today.   Venia Carbon I, NP 10/29/2019 11:12 AM

## 2019-11-01 ENCOUNTER — Ambulatory Visit (INDEPENDENT_AMBULATORY_CARE_PROVIDER_SITE_OTHER): Payer: Medicaid Other | Admitting: Clinical

## 2019-11-01 DIAGNOSIS — F319 Bipolar disorder, unspecified: Secondary | ICD-10-CM | POA: Diagnosis not present

## 2019-11-02 ENCOUNTER — Ambulatory Visit: Payer: Medicaid Other

## 2019-11-03 ENCOUNTER — Encounter: Payer: Medicaid Other | Admitting: Obstetrics & Gynecology

## 2019-11-08 NOTE — BH Specialist Note (Signed)
Integrated Behavioral Health via Telemedicine Video Visit  Pt has lost her voice, and unable to hear much of what she is saying. Pt wants to continue visit today, but unable to hear her words clearly, so agrees to reschedule for one week, when she will likely regain her voice. Pt also requests to be rescheduled for OB appointment, as she missed the last visit when she wasn't feeling well; prefers morning.   11/08/2019 [name] [id number]  Number of Integrated Behavioral Health visits: 5 Session Start time: 8:50  Session End time: 9:03 Total time: 13  Referring Provider: Venia Carbon, NP Type of Visit: Video Patient/Family location: Home [facility] Provider location: Center for [facility] at [facility]  All persons participating in visit: [name] and [facility] [address]    Valetta Close [facility]

## 2019-11-09 ENCOUNTER — Ambulatory Visit (INDEPENDENT_AMBULATORY_CARE_PROVIDER_SITE_OTHER): Payer: Medicaid Other | Admitting: Clinical

## 2019-11-09 ENCOUNTER — Other Ambulatory Visit: Payer: Self-pay

## 2019-11-09 DIAGNOSIS — Z658 Other specified problems related to psychosocial circumstances: Secondary | ICD-10-CM

## 2019-11-09 DIAGNOSIS — F319 Bipolar disorder, unspecified: Secondary | ICD-10-CM

## 2019-11-12 ENCOUNTER — Encounter: Payer: Medicaid Other | Admitting: Nurse Practitioner

## 2019-11-15 NOTE — BH Specialist Note (Signed)
Integrated Behavioral Health via Telemedicine Phone Visit  11/15/2019 [name] [id number]  Number of [place] visits: 5 (previous contact less than 5 minutes; not counted in 5 visits) Session Start time: 8:24  Session End time: 8:45 Total time: 21  Referring Provider: Noni Saupe, NP Type of Visit: Phone Patient/Family location: Home [facility] Provider location: [facility] [place] [facility]  All persons participating in visit: [name] and Rose Schwartz \   Confirmed patient's address: Yes  Confirmed patient's phone number: Yes  Pt needs to use another number today, as her phone is out of charge [phone]) Any changes to demographics: No   Confirmed patient's insurance: Yes  Any changes to patient's insurance: No   Discussed confidentiality: at previous visit  I connected with Rose Schwartz  by a video enabled telemedicine application and verified that I am speaking with the correct person using two identifiers.     I discussed the limitations of evaluation and management by telemedicine and the availability of in person appointments.  I discussed that the purpose of this visit is to provide behavioral health care while limiting exposure to the novel coronavirus.   Discussed there is a possibility of technology failure and discussed alternative modes of communication if that failure occurs.  I discussed that engaging in this video visit, they consent to the provision of behavioral healthcare and the services will be billed under their insurance.  Patient and/or legal guardian expressed understanding and consented to video visit: Yes   PRESENTING CONCERNS: Patient and/or family reports the following symptoms/concerns: Pt states primary concerns today are severe allergies affecting work and stress over not telling family she is pregnant.  Duration of problem: Current pregnancy;  Severity of problem: moderate  STRENGTHS (Protective Factors/Coping Skills): Supportive FOB; hopeful outlook   GOALS ADDRESSED: Patient will: 1.  Reduce symptoms of: anxiety, depression and stress  2.  Increase knowledge and/or ability of: stress reduction  3.  Demonstrate ability to: Increase healthy adjustment to current life circumstances and Improve medication compliance  INTERVENTIONS: Interventions utilized:  Solution-Focused Strategies Standardized Assessments completed: GAD-7 and PHQ 9  ASSESSMENT: Patient currently experiencing Bipolar 1 disorder and Psychosocial stress.   Patient may benefit from continued psychoeducation and brief therapeutic interventions regarding coping with symptoms of anxiety and depression   PLAN: 1. Follow up with behavioral health clinician on : Two weeks 2. Behavioral recommendations:  -Continue taking prenatal vitamin daily -Talk to medical provider today about allergy medication; follow advice of medical provider -Continue with plan to tell cousin about pregnancy prior to cousin's baby shower on 5/29, and make plan together about announcing positive pregnancy with rest of family at the baby shower -Continue to advocate for self at work, as discussed 3. Referral(s): [place] (In Clinic)  I discussed the assessment and treatment plan with the patient and/or parent/guardian. They were provided an opportunity to ask questions and all were answered. They agreed with the plan and demonstrated an understanding of the instructions.   They were advised to call back or seek an in-person evaluation if the symptoms worsen or if the condition fails to improve as anticipated.  Caroleen Hamman Trig Mcbryar  Depression screen [facility] 2/9 11/16/2019 10/14/2019 09/30/2019 07/14/2019  Decreased Interest 1 1 0 2  Down, Depressed, Hopeless 1 1 0 1  PHQ - 2 Score 2 2 0 3  Altered sleeping 1 3 2 1   Tired, decreased energy  2 2 3 3   Change in appetite 2  1 3 3   Feeling bad or failure about yourself  1 1 0 1  Trouble concentrating 2 2 1 1   Moving slowly or fidgety/restless 0 0 1 0  Suicidal thoughts 0 0 0 0  PHQ-9 Score 10 11 10 12    GAD 7 : Generalized Anxiety Score 11/16/2019 10/14/2019 09/30/2019 07/14/2019  Nervous, Anxious, on Edge 1 1 3 1   Control/stop worrying 2 1 3 1   Worry too much - different things 2 1 3 1   Trouble relaxing 0 1 3 1   Restless 0 0 0 1  Easily annoyed or irritable 3 2 3 1   Afraid - awful might happen 1 1 1 1   Total GAD 7 Score 9 7 16  7

## 2019-11-16 ENCOUNTER — Ambulatory Visit (INDEPENDENT_AMBULATORY_CARE_PROVIDER_SITE_OTHER): Payer: Medicaid Other | Admitting: Clinical

## 2019-11-16 ENCOUNTER — Encounter: Payer: Medicaid Other | Admitting: Student

## 2019-11-16 ENCOUNTER — Ambulatory Visit (INDEPENDENT_AMBULATORY_CARE_PROVIDER_SITE_OTHER): Payer: Medicaid Other | Admitting: Student

## 2019-11-16 ENCOUNTER — Other Ambulatory Visit: Payer: Self-pay

## 2019-11-16 VITALS — BP 113/75 | HR 115 | Wt 143.0 lb

## 2019-11-16 DIAGNOSIS — F319 Bipolar disorder, unspecified: Secondary | ICD-10-CM | POA: Diagnosis not present

## 2019-11-16 DIAGNOSIS — Z23 Encounter for immunization: Secondary | ICD-10-CM | POA: Diagnosis not present

## 2019-11-16 DIAGNOSIS — Z3492 Encounter for supervision of normal pregnancy, unspecified, second trimester: Secondary | ICD-10-CM

## 2019-11-16 DIAGNOSIS — Z3A28 28 weeks gestation of pregnancy: Secondary | ICD-10-CM

## 2019-11-16 DIAGNOSIS — Q740 Other congenital malformations of upper limb(s), including shoulder girdle: Secondary | ICD-10-CM

## 2019-11-16 DIAGNOSIS — Z658 Other specified problems related to psychosocial circumstances: Secondary | ICD-10-CM

## 2019-11-16 NOTE — Patient Instructions (Addendum)
Glucose Tolerance Test During Pregnancy Why am I having this test? The glucose tolerance test (GTT) is done to check how your body processes sugar (glucose). This is one of several tests used to diagnose diabetes that develops during pregnancy (gestational diabetes mellitus). Gestational diabetes is a temporary form of diabetes that some women develop during pregnancy. It usually occurs during the second trimester of pregnancy and goes away after delivery. Testing (screening) for gestational diabetes usually occurs between 24 and 28 weeks of pregnancy. You may have the GTT test after having a 1-hour glucose screening test if the results from that test indicate that you may have gestational diabetes. You may also have this test if:  You have a history of gestational diabetes.  You have a history of giving birth to very large babies or have experienced repeated fetal loss (stillbirth).  You have signs and symptoms of diabetes, such as: ? Changes in your vision. ? Tingling or numbness in your hands or feet. ? Changes in hunger, thirst, and urination that are not otherwise explained by your pregnancy. What is being tested? This test measures the amount of glucose in your blood at different times during a period of 3 hours. This indicates how well your body is able to process glucose. What kind of sample is taken?  Blood samples are required for this test. They are usually collected by inserting a needle into a blood vessel. How do I prepare for this test?  For 3 days before your test, eat normally. Have plenty of carbohydrate-rich foods.  Follow instructions from your health care provider about: ? Eating or drinking restrictions on the day of the test. You may be asked to not eat or drink anything other than water (fast) starting 8-10 hours before the test. ? Changing or stopping your regular medicines. Some medicines may interfere with this test. Tell a health care provider about:  All  medicines you are taking, including vitamins, herbs, eye drops, creams, and over-the-counter medicines.  Any blood disorders you have.  Any surgeries you have had.  Any medical conditions you have. What happens during the test? First, your blood glucose will be measured. This is referred to as your fasting blood glucose, since you fasted before the test. Then, you will drink a glucose solution that contains a certain amount of glucose. Your blood glucose will be measured again 1, 2, and 3 hours after drinking the solution. This test takes about 3 hours to complete. You will need to stay at the testing location during this time. During the testing period:  Do not eat or drink anything other than the glucose solution.  Do not exercise.  Do not use any products that contain nicotine or tobacco, such as cigarettes and e-cigarettes. If you need help stopping, ask your health care provider. The testing procedure may vary among health care providers and hospitals. How are the results reported? Your results will be reported as milligrams of glucose per deciliter of blood (mg/dL) or millimoles per liter (mmol/L). Your health care provider will compare your results to normal ranges that were established after testing a large group of people (reference ranges). Reference ranges may vary among labs and hospitals. For this test, common reference ranges are:  Fasting: less than 95-105 mg/dL (5.3-5.8 mmol/L).  1 hour after drinking glucose: less than 180-190 mg/dL (10.0-10.5 mmol/L).  2 hours after drinking glucose: less than 155-165 mg/dL (8.6-9.2 mmol/L).  3 hours after drinking glucose: 140-145 mg/dL (7.8-8.1 mmol/L). What do the   results mean? Results within reference ranges are considered normal, meaning that your glucose levels are well-controlled. If two or more of your blood glucose levels are high, you may be diagnosed with gestational diabetes. If only one level is high, your health care  provider may suggest repeat testing or other tests to confirm a diagnosis. Talk with your health care provider about what your results mean. Questions to ask your health care provider Ask your health care provider, or the department that is doing the test:  When will my results be ready?  How will I get my results?  What are my treatment options?  What other tests do I need?  What are my next steps? Summary  The glucose tolerance test (GTT) is one of several tests used to diagnose diabetes that develops during pregnancy (gestational diabetes mellitus). Gestational diabetes is a temporary form of diabetes that some women develop during pregnancy.  You may have the GTT test after having a 1-hour glucose screening test if the results from that test indicate that you may have gestational diabetes. You may also have this test if you have any symptoms or risk factors for gestational diabetes.  Talk with your health care provider about what your results mean. This information is not intended to replace advice given to you by your health care provider. Make sure you discuss any questions you have with your health care provider. Document Revised: 10/15/2018 Document Reviewed: 02/03/2017 Elsevier Patient Education  2020 [employer]. Third Trimester of Pregnancy  The third trimester is from week 28 through week 40 (months 7 through 9). This trimester is when your unborn baby (fetus) is growing very fast. At the end of the ninth month, the unborn baby is about 20 inches in length. It weighs about 6-10 pounds. Follow these instructions at home: Medicines  Take over-the-counter and prescription medicines only as told by your doctor. Some medicines are safe and some medicines are not safe during pregnancy.  Take a prenatal vitamin that contains at least 600 micrograms (mcg) of folic acid.  If you have trouble pooping (constipation), take medicine that will make your stool soft (stool softener)  if your doctor approves. Eating and drinking   Eat regular, healthy meals.  Avoid raw meat and uncooked cheese.  If you get low calcium from the food you eat, talk to your doctor about taking a daily calcium supplement.  Eat four or five small meals rather than three large meals a day.  Avoid foods that are high in fat and sugars, such as fried and sweet foods.  To prevent constipation: ? Eat foods that are high in fiber, like fresh fruits and vegetables, whole grains, and beans. ? Drink enough fluids to keep your pee (urine) clear or pale yellow. Activity  Exercise only as told by your doctor. Stop exercising if you start to have cramps.  Avoid heavy lifting, wear low heels, and sit up straight.  Do not exercise if it is too hot, too humid, or if you are in a place of great height (high altitude).  You may continue to have sex unless your doctor tells you not to. Relieving pain and discomfort  Wear a good support bra if your breasts are tender.  Take frequent breaks and rest with your legs raised if you have leg cramps or low back pain.  Take warm water baths (sitz baths) to soothe pain or discomfort caused by hemorrhoids. Use hemorrhoid cream if your doctor approves.  If you develop  puffy, bulging veins (varicose veins) in your legs: ? Wear support hose or compression stockings as told by your doctor. ? Raise (elevate) your feet for 15 minutes, 3-4 times a day. ? Limit salt in your food. Safety  Wear your seat belt when driving.  Make a list of emergency phone numbers, including numbers for family, friends, the hospital, and police and fire departments. Preparing for your baby's arrival To prepare for the arrival of your baby:  Take prenatal classes.  Practice driving to the hospital.  Visit the hospital and tour the maternity area.  Talk to your work about taking leave once the baby comes.  Pack your hospital bag.  Prepare the baby's room.  Go to your  doctor visits.  Buy a rear-facing car seat. Learn how to install it in your car. General instructions  Do not use hot tubs, steam rooms, or saunas.  Do not use any products that contain nicotine or tobacco, such as cigarettes and e-cigarettes. If you need help quitting, ask your doctor.  Do not drink alcohol.  Do not douche or use tampons or scented sanitary pads.  Do not cross your legs for long periods of time.  Do not travel for long distances unless you must. Only do so if your doctor says it is okay.  Visit your dentist if you have not gone during your pregnancy. Use a soft toothbrush to brush your teeth. Be gentle when you floss.  Avoid cat litter boxes and soil used by cats. These carry germs that can cause birth defects in the baby and can cause a loss of your baby (miscarriage) or stillbirth.  Keep all your prenatal visits as told by your doctor. This is important. Contact a doctor if:  You are not sure if you are in labor or if your water has broken.  You are dizzy.  You have mild cramps or pressure in your lower belly.  You have a nagging pain in your belly area.  You continue to feel sick to your stomach, you throw up, or you have watery poop.  You have bad smelling fluid coming from your vagina.  You have pain when you pee. Get help right away if:  You have a fever.  You are leaking fluid from your vagina.  You are spotting or bleeding from your vagina.  You have severe belly cramps or pain.  You lose or gain weight quickly.  You have trouble catching your breath and have chest pain.  You notice sudden or extreme puffiness (swelling) of your face, hands, ankles, feet, or legs.  You have not felt the baby move in over an hour.  You have severe headaches that do not go away with medicine.  You have trouble seeing.  You are leaking, or you are having a gush of fluid, from your vagina before you are 37 weeks.  You have regular belly spasms  (contractions) before you are 37 weeks. Summary  The third trimester is from week 28 through week 40 (months 7 through 9). This time is when your unborn baby is growing very fast.  Follow your doctor's advice about medicine, food, and activity.  Get ready for the arrival of your baby by taking prenatal classes, getting all the baby items ready, preparing the baby's room, and visiting your doctor to be checked.  Get help right away if you are bleeding from your vagina, or you have chest pain and trouble catching your breath, or if you have not felt  your baby move in over an hour. This information is not intended to replace advice given to you by your health care provider. Make sure you discuss any questions you have with your health care provider. Document Revised: 10/15/2018 Document Reviewed: 07/30/2016 Elsevier Patient Education  2020 [employer].    Safe Medications in Pregnancy   Acne: Benzoyl Peroxide Salicylic Acid  Backache/Headache: Tylenol: 2 regular strength every 4 hours OR              2 Extra strength every 6 hours  Colds/Coughs/Allergies: Benadryl (alcohol free) 25 mg every 6 hours as needed Breath right strips Claritin Cepacol throat lozenges Chloraseptic throat spray Cold-Eeze- up to three times per day Cough drops, alcohol free Flonase (by prescription only) Guaifenesin Mucinex Robitussin DM (plain only, alcohol free) Saline nasal spray/drops Sudafed (pseudoephedrine) & Actifed ** use only after [redacted] weeks gestation and if you do not have high blood pressure Tylenol Vicks Vaporub Zinc lozenges Zyrtec   Constipation: Colace Ducolax suppositories Fleet enema Glycerin suppositories Metamucil Milk of magnesia Miralax Senokot Smooth move tea  Diarrhea: Kaopectate Imodium A-D  *NO pepto Bismol  Hemorrhoids: Anusol Anusol HC Preparation H Tucks  Indigestion: Tums Maalox Mylanta Zantac  Pepcid  Insomnia: Benadryl (alcohol free) 25mg   every 6 hours as needed Tylenol PM Unisom, no Gelcaps  Leg Cramps: Tums MagGel  Nausea/Vomiting:  Bonine Dramamine Emetrol Ginger extract Sea bands Meclizine  Nausea medication to take during pregnancy:  Unisom (doxylamine succinate 25 mg tablets) Take one tablet daily at bedtime. If symptoms are not adequately controlled, the dose can be increased to a maximum recommended dose of two tablets daily (1/2 tablet in the morning, 1/2 tablet mid-afternoon and one at bedtime). Vitamin B6 100mg  tablets. Take one tablet twice a day (up to 200 mg per day).  Skin Rashes: Aveeno products Benadryl cream or 25mg  every 6 hours as needed Calamine Lotion 1% cortisone cream  Yeast infection: Gyne-lotrimin 7 Monistat 7  Gum/tooth pain: Anbesol  **If taking multiple medications, please check labels to avoid duplicating the same active ingredients **take medication as directed on the label ** Do not exceed 4000 mg of tylenol in 24 hours **Do not take medications that contain aspirin or ibuprofen

## 2019-11-16 NOTE — Progress Notes (Signed)
   PRENATAL VISIT NOTE  Subjective:  Rose Schwartz is a 19 y.o. G1P0 at [redacted]w[redacted]d being seen today for ongoing prenatal care.  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  Patient reports occasional dizziness. States this normally happens when she has to work. Doesn't eat much during the day. So far today she has only had half a cup of orange juice, no water, and no food. No synopal episodes. .  Contractions: Not present. Vag. Bleeding: None.  Movement: Present. Denies leaking of fluid.   The following portions of the patient's history were reviewed and updated as appropriate: allergies, current medications, past family history, past medical history, past social history, past surgical history and problem list.   Objective:   Vitals:   11/16/19 1025  BP: 113/75  Pulse: (!) 115  Weight: 143 lb (64.9 kg)    Fetal Status: Fetal Heart Rate (bpm): 156 Fundal Height: 28 cm Movement: Present     General:  Alert, oriented and cooperative. Patient is in no acute distress.  Skin: Skin is warm and dry. No rash noted.   Cardiovascular: Normal heart rate noted  Respiratory: Normal respiratory effort, no problems with respiration noted  Abdomen: Soft, gravid, appropriate for gestational age.  Pain/Pressure: Present     Pelvic: Cervical exam deferred        Extremities: Normal range of motion.  Edema: Trace  Mental Status: Normal mood and affect. Normal behavior. Normal judgment and thought content.   Assessment and Plan:  Pregnancy: G1P0 at [redacted]w[redacted]d 1. Encounter for supervision of low-risk pregnancy in second trimester -discussed snacks & meals every 2-3 hours. Also needs to drink more water during the day.  -not fasting today, will come back for 28 wk labs   Preterm labor  symptoms and general obstetric precautions including but not limited to vaginal bleeding, contractions, leaking of fluid and fetal movement were reviewed in detail with the patient. Please refer to After Visit Summary for other counseling recommendations.   Return in about 2 weeks (around 11/30/2019) for Routine OB, virtual.  Future Appointments  Date Time Provider Department Center  11/18/2019  8:50 AM [facility]  11/30/2019  8:35 AM Currie Paris, NP [facility]  12/07/2019  8:15 AM [facility]    Judeth Horn, NP

## 2019-11-18 ENCOUNTER — Other Ambulatory Visit: Payer: Self-pay

## 2019-11-18 ENCOUNTER — Other Ambulatory Visit: Payer: Medicaid Other

## 2019-11-18 DIAGNOSIS — Z3493 Encounter for supervision of normal pregnancy, unspecified, third trimester: Secondary | ICD-10-CM

## 2019-11-19 ENCOUNTER — Other Ambulatory Visit: Payer: Self-pay

## 2019-11-19 ENCOUNTER — Encounter (HOSPITAL_COMMUNITY): Payer: Self-pay | Admitting: Family Medicine

## 2019-11-19 ENCOUNTER — Other Ambulatory Visit: Payer: Self-pay | Admitting: Student

## 2019-11-19 ENCOUNTER — Inpatient Hospital Stay (HOSPITAL_COMMUNITY): Payer: Medicaid Other

## 2019-11-19 ENCOUNTER — Inpatient Hospital Stay (HOSPITAL_COMMUNITY)
Admission: AD | Admit: 2019-11-19 | Discharge: 2019-11-19 | Disposition: A | Discharge status: Home / Self Care | Payer: Medicaid Other | Attending: Family Medicine | Admitting: Family Medicine

## 2019-11-19 DIAGNOSIS — O26893 Other specified pregnancy related conditions, third trimester: Secondary | ICD-10-CM | POA: Diagnosis not present

## 2019-11-19 DIAGNOSIS — Z79899 Other long term (current) drug therapy: Secondary | ICD-10-CM | POA: Insufficient documentation

## 2019-11-19 DIAGNOSIS — O99891 Other specified diseases and conditions complicating pregnancy: Secondary | ICD-10-CM

## 2019-11-19 DIAGNOSIS — N1330 Unspecified hydronephrosis: Secondary | ICD-10-CM | POA: Diagnosis not present

## 2019-11-19 DIAGNOSIS — J45909 Unspecified asthma, uncomplicated: Secondary | ICD-10-CM | POA: Insufficient documentation

## 2019-11-19 DIAGNOSIS — R109 Unspecified abdominal pain: Secondary | ICD-10-CM | POA: Diagnosis not present

## 2019-11-19 DIAGNOSIS — Z3689 Encounter for other specified antenatal screening: Secondary | ICD-10-CM

## 2019-11-19 DIAGNOSIS — Z3A28 28 weeks gestation of pregnancy: Secondary | ICD-10-CM

## 2019-11-19 DIAGNOSIS — O99513 Diseases of the respiratory system complicating pregnancy, third trimester: Secondary | ICD-10-CM | POA: Insufficient documentation

## 2019-11-19 DIAGNOSIS — O99013 Anemia complicating pregnancy, third trimester: Secondary | ICD-10-CM

## 2019-11-19 LAB — CBC
Hematocrit: 28.4 % — ABNORMAL LOW (ref 34.0–46.6)
Hemoglobin: 9.1 g/dL — ABNORMAL LOW (ref 11.1–15.9)
MCH: 26.5 pg — ABNORMAL LOW (ref 26.6–33.0)
MCHC: 32 g/dL (ref 31.5–35.7)
MCV: 83 fL (ref 79–97)
Platelets: 225 10*3/uL (ref 150–450)
RBC: 3.43 x10E6/uL — ABNORMAL LOW (ref 3.77–5.28)
RDW: 13.1 % (ref 11.7–15.4)
WBC: 13.1 10*3/uL — ABNORMAL HIGH (ref 3.4–10.8)

## 2019-11-19 LAB — URINALYSIS, ROUTINE W REFLEX MICROSCOPIC
Bilirubin Urine: NEGATIVE
Glucose, UA: 50 mg/dL — AB
Hgb urine dipstick: NEGATIVE
Ketones, ur: 5 mg/dL — AB
Nitrite: NEGATIVE
Protein, ur: 30 mg/dL — AB
Specific Gravity, Urine: 1.026 (ref 1.005–1.030)
pH: 6 (ref 5.0–8.0)

## 2019-11-19 LAB — GLUCOSE TOLERANCE, 2 HOURS W/ 1HR
Glucose, 1 hour: 96 mg/dL (ref 65–179)
Glucose, 2 hour: 93 mg/dL (ref 65–152)
Glucose, Fasting: 79 mg/dL (ref 65–91)

## 2019-11-19 LAB — HIV ANTIBODY (ROUTINE TESTING W REFLEX): HIV Screen 4th Generation wRfx: NONREACTIVE

## 2019-11-19 LAB — RPR: RPR Ser Ql: NONREACTIVE

## 2019-11-19 MED ORDER — ACETAMINOPHEN 500 MG PO TABS
1000.0000 mg | ORAL_TABLET | Freq: Once | ORAL | Status: AC
  Administered 2019-11-19: 1000 mg via ORAL
  Filled 2019-11-19: qty 2

## 2019-11-19 MED ORDER — FERROUS SULFATE 325 (65 FE) MG PO TABS: 325.0000 mg | ORAL_TABLET | Freq: Two times a day (BID) | ORAL | 1 refills | Status: DC

## 2019-11-19 NOTE — MAU Provider Note (Signed)
History     CSN: [id number]  Arrival date and time: 11/19/19 4403   First Provider Initiated Contact with Patient 11/19/19 2023      Chief Complaint  Patient presents with  . Abdominal Pain   Rose Schwartz is a 19 y.o. G1P0 at 63w6dwho [facility]  She presents today for Abdominal Pain that started about 2-3 days ago.  She states the pain is located on her left side and is constant.  She describes the pain as sharp pain that she rates a 5/10.  She states the pain is aggravated with movement or touch (I.e. hug) and is 7-8/10 at that time.   She reports that the pain is improved with laying down.  She states she has not tried to take anything for the pain.  She endorses fetal movement and denies abdominal cramping or contractions.  She states she has some vaginal discharge, but goes on to admit that she had sexual activity this morning. However, she states she had no discharge of concern prior to that.  She denies vaginal itching, burning, or odor.  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 Patient with various questions regarding Midwifery care as patient expresses interest in childbirth courses and waterbirth.      OB History    Gravida  1   Para      Term      Preterm      AB      Living        SAB      TAB      Ectopic      Multiple      Live Births              Past Medical History:  Diagnosis Date  . Asthma   . Bipolar 1 disorder (HTidioute   . UTI (urinary tract infection)     Past Surgical History:  Procedure Laterality Date  . FETAL AMNIOTIC BAND RELEASE      Family History  Problem Relation Age of Onset  . Fibromyalgia Mother     Social History   Tobacco Use  . Smoking status: Never Smoker  . Smokeless tobacco: Never Used  Substance Use Topics  . Alcohol use: Not Currently    Comment: SOCIAL  . Drug use: Not Currently     Types: Marijuana    Comment: few years ago    Allergies:  Allergies  Allergen Reactions  . Other     Tree nuts    Medications Prior to Admission  Medication Sig Dispense Refill Last Dose  . albuterol (VENTOLIN HFA) 108 (90 Base) MCG/ACT inhaler Inhale 2 puffs into the lungs every 4 (four) hours as needed for wheezing or shortness of breath (cough, shortness of breath or wheezing.). 1 Inhaler 1   . Blood Pressure Monitoring (BLOOD PRESSURE KIT) DEVI 1 Device by Does not apply route as needed. 1 each 0   . cetirizine (ZYRTEC ALLERGY) 10 MG tablet Take 1 tablet (10 mg total) by mouth daily. 60 tablet 1   . cetirizine-pseudoephedrine (ZYRTEC-D) 5-120 MG tablet Take 1 tablet by mouth daily. 30 tablet 0   . cyclobenzaprine (FLEXERIL) 10 MG tablet Take 1 tablet (10 mg total) by mouth at bedtime as needed for muscle spasms. 30 tablet 2   . EPINEPHrine 0.3 mg/0.3 mL IJ SOAJ injection AS DIRECTED AS NEEDED  FOR SYSTEMIC REACTIONS INJECTION     . ferrous sulfate (FERROUSUL) 325 (65 FE) MG tablet Take 1 tablet (325 mg total) by mouth 2 (two) times daily. 60 tablet 1   . Prenatal MV-Min-FA-Omega-3 (PRENATAL GUMMIES/DHA & FA) 0.4-32.5 MG CHEW Chew 2 each by mouth.     . Prenatal Vit-Fe Fumarate-FA (PREPLUS) 27-1 MG TABS Take 1 tablet by mouth daily. 30 tablet 6     Review of Systems  Constitutional: Negative for chills and fever.  Respiratory: Negative for cough and shortness of breath.   Gastrointestinal: Positive for constipation. Negative for abdominal pain, diarrhea, nausea and vomiting.  Genitourinary: Positive for flank pain and vaginal discharge. Negative for difficulty urinating, dysuria and vaginal bleeding.  Musculoskeletal: Positive for back pain (Lower ).  Neurological: Negative for dizziness, light-headedness and headaches.   Physical Exam   Blood pressure 118/64, pulse (!) 105, temperature 98.9 F (37.2 C), temperature source Oral, resp. rate 18, height '5\' 1"'  (1.549 m), weight 66.1  kg, last menstrual period 04/22/2019, SpO2 100 %.  Physical Exam  Constitutional: She is oriented to person, place, and time. She appears well-developed and well-nourished.  HENT:  Head: Normocephalic and atraumatic.  Eyes: Conjunctivae are normal.  Cardiovascular: Normal rate, regular rhythm and normal heart sounds.  Respiratory: Effort normal and breath sounds normal.  GI: Soft. There is no abdominal tenderness.  Gravid--fundal height appears AGA, Soft, NT   Musculoskeletal:        General: Normal range of motion.     Cervical back: Normal range of motion.     Comments: Left flank pain that is tender to touch.   Neurological: She is alert and oriented to person, place, and time.  Skin: Skin is warm and dry.  Psychiatric: She has a normal mood and affect. Her behavior is normal.    Fetal Assessment 145 bpm, Mod Var, -Decels, +15x15 Accels Toco: None graphed  MAU Course   Results for orders placed or performed during the hospital encounter of 11/19/19 (from the past 24 hour(s))  Urinalysis, Routine w reflex microscopic     Status: Abnormal   Collection Time: 11/19/19  8:05 PM  Result Value Ref Range   Color, Urine YELLOW YELLOW   APPearance HAZY (A) CLEAR   Specific Gravity, Urine 1.026 1.005 - 1.030   pH 6.0 5.0 - 8.0   Glucose, UA 50 (A) NEGATIVE mg/dL   Hgb urine dipstick NEGATIVE NEGATIVE   Bilirubin Urine NEGATIVE NEGATIVE   Ketones, ur 5 (A) NEGATIVE mg/dL   Protein, ur 30 (A) NEGATIVE mg/dL   Nitrite NEGATIVE NEGATIVE   Leukocytes,Ua MODERATE (A) NEGATIVE   RBC / HPF 0-5 0 - 5 RBC/hpf   WBC, UA 6-10 0 - 5 WBC/hpf   Bacteria, UA RARE (A) NONE SEEN   Squamous Epithelial / LPF 6-10 0 - 5   Mucus PRESENT    Ca Oxalate Crys, UA PRESENT    US RENAL  Result Date: 11/19/2019 CLINICAL DATA:  Left flank pain. Pregnant patient at 28 weeks 6 days gestation. EXAM: RENAL / URINARY TRACT ULTRASOUND COMPLETE COMPARISON:  None. FINDINGS: Right Kidney: Renal measurements: 11.3  x 5.7 x 5.6 cm = volume: 189 mL. Mild hydronephrosis. Normal renal echogenicity. No evidence of focal lesion or calculus. Left Kidney: Renal measurements: 10.2 x 5.9 x 6.6 cm = volume: 209 mL. Mild hydronephrosis. Normal renal echogenicity. No evidence of focal lesion or calculus. Bladder: Appears normal for degree of bladder distention. Both ureteral jets are visualized.  Other: Gravid uterus noted, not assessed on the current exam. IMPRESSION: 1. Mild bilateral hydronephrosis.  No renal calculi are visualized. 2. Both ureteral jets are visualized. Electronically Signed   By: Keith Rake M.D.   On: 11/19/2019 22:06    MDM PE Labs:UA, UC EFM Renal US Assessment and Plan  19 year old G1P0  SIUP at 28.6weeks Cat I FT Flank Pain  -POC reviewed -Exam performed and findings discussed. -Educated on importance of proper hydration throughout the day. -Will give tylenol for pain. -Will send for [place] to assess kidneys. -NST reactive-Okay to discontinue EFM.  -Brief discussion regarding midwifery care and how it is NOT the same as a doula.   *Informed that she can speak with provider at her next appt regarding waterbirth and address any further questions regarding Midwifery care. *Provider will send patient mychart message with [place] online class information.   Maryann Conners MSN, CNM 11/19/2019, 8:23 PM    Reassessment (10:20 PM) Mild Bilateral Hydronephrosis  -Results discussed with patient. -Reassured that normal findings during pregnancy and would resolve spontaneously usually after delivery. -Reiterated need for proper hydration.  -Reviewed s/s of worsening condition. -Patient without further questions or concerns. -Instructed to keep appt as scheduled.  -Encouraged to call or return to MAU if symptoms worsen or with the onset of new symptoms. -Discharged to home in improved condition.  Maryann Conners MSN, CNM Advanced Practice Provider, [facility]

## 2019-11-19 NOTE — Discharge Instructions (Signed)
Hydronephrosis  Hydronephrosis is the swelling of one or both kidneys due to a blockage that stops urine from flowing out of the body. Kidneys filter waste from the blood and produce urine. This condition can lead to kidney failure and may become life threatening if not treated promptly. What are the causes? Common causes of this condition include:  Problems that occur when a baby is developing in the womb (congenital defect). These can include problems: ? In the kidneys. ? In the tubes that drain urine from the kidneys into the bladder (ureters).  Kidney stones.  Bladder infection.  An enlarged prostate gland.  Scar tissue from a previous surgery or injury.  A blood clot.  A tumor or cyst in the abdomen or pelvis.  Cancer of the prostate, bladder, uterus, ovary, or colon. What are the signs or symptoms? Symptoms of this condition include:  Pain or discomfort in your side (flank).  Pain and swelling in your abdomen.  Nausea and vomiting.  Fever.  Pain when passing urine.  Feelings of urgency when you need to urinate.  Urinating more often than normal. In some cases, you may not have any symptoms. How is this diagnosed? This condition may be diagnosed based on:  Your symptoms and medical history.  A physical exam.  Blood and urine tests.  Imaging tests, such as an ultrasound, CT scan, or MRI.  A procedure in which a scope is inserted into the urethra and used to view parts of the urinary tract and bladder (cystoscopy). How is this treated? Treatment for this condition depends on where the blockage is, how long it has been there, and what caused it. The goal of treatment is to remove the blockage. Treatment may include:  Antibiotic medicines to treat or prevent infection.  A procedure to place a small, thin tube (stent) into a blocked ureter. The stent will keep the ureter open so that urine can drain through it.  A nonsurgical procedure that crushes kidney  stones with shock waves (extracorporeal shock wave lithotripsy).  If kidney failure occurs, treatment may include dialysis or a kidney transplant. Follow these instructions at home:   Take over-the-counter and prescription medicines only as told by your health care provider.  Rest and return to your normal activities as told by your health care provider. Ask your health care provider what activities are safe for you.  Drink enough fluid to keep your urine pale yellow.  If you were prescribed an antibiotic medicine, take it exactly as told by your health care provider. Do not stop taking the antibiotic even if you start to feel better.  Keep all follow-up visits as told by your health care provider. This is important. Contact a health care provider if:  You continue to have symptoms after treatment.  You develop new symptoms.  Your urine becomes cloudy or bloody.  You have a fever. Get help right away if:  You have severe flank or abdominal pain.  You cannot drink fluids without vomiting. Summary  Hydronephrosis is the swelling of one or both kidneys due to a blockage that stops urine from flowing out of the body.  Hydronephrosis can lead to kidney failure and may become life threatening if not treated promptly.  The goal of treatment is to treat the cause of the blockage. It may include insertion of stent into a blocked ureter, a procedure to treat kidney stones, and antibiotic medicines.  Follow your health care provider's instructions for taking care of yourself at   home, including instructions about drinking fluids, taking medicines, and limiting activities. This information is not intended to replace advice given to you by your health care provider. Make sure you discuss any questions you have with your health care provider. Document Revised: 07/05/2017 Document Reviewed: 07/05/2017 Elsevier Patient Education  2020 Elsevier Inc.  

## 2019-11-19 NOTE — MAU Note (Signed)
Pt here with reports of sharp pain on left side close to ribs. Pt reports pain on left sided started about 2-3 days ago, but worse today. States the pain hurts when it is touched and is constant. Pt states it improves with rest some times, but has not tried Tylenol. Pt also reports some pain right above belly button that started about 3 weeks ago. Pain comes and goes.

## 2019-11-21 LAB — CULTURE, OB URINE: Culture: >=100000 — AB

## 2019-11-22 ENCOUNTER — Other Ambulatory Visit (INDEPENDENT_AMBULATORY_CARE_PROVIDER_SITE_OTHER): Payer: Medicaid Other | Admitting: Obstetrics and Gynecology

## 2019-11-22 DIAGNOSIS — R109 Unspecified abdominal pain: Secondary | ICD-10-CM

## 2019-11-22 MED ORDER — CEFADROXIL 500 MG PO CAPS: 500.0000 mg | ORAL_CAPSULE | Freq: Two times a day (BID) | ORAL | 0 refills | Status: AC

## 2019-11-22 NOTE — Progress Notes (Signed)
UCx with >100K lactobacilli -->Rx for Cefadroxil 500 mg BID x 10 days sent. Patient notified by TC -- ID confirmed my name and DOB. Patient to F/U at [facility] on 11/30/19.  Raelyn Mora, CNM

## 2019-11-30 ENCOUNTER — Other Ambulatory Visit: Payer: Self-pay

## 2019-11-30 ENCOUNTER — Telehealth (INDEPENDENT_AMBULATORY_CARE_PROVIDER_SITE_OTHER): Payer: Medicaid Other | Admitting: Nurse Practitioner

## 2019-11-30 ENCOUNTER — Telehealth: Payer: Self-pay | Admitting: Nurse Practitioner

## 2019-11-30 DIAGNOSIS — Z5329 Procedure and treatment not carried out because of patient's decision for other reasons: Secondary | ICD-10-CM

## 2019-11-30 DIAGNOSIS — Z91199 Patient's noncompliance with other medical treatment and regimen due to unspecified reason: Secondary | ICD-10-CM

## 2019-11-30 NOTE — Progress Notes (Signed)
0835--Called patient to check in for appt, no answer- left message to connect with mychart video and that I would call her back again   [phone]-- Called patient to check in for appt, no answer- left message requesting she call us back to reschedule her missed appt.

## 2019-11-30 NOTE — Telephone Encounter (Signed)
Attempted to reach patient about her missed appointment. Left a voicemail message for her to call us to talk about her rescheduled appointment.

## 2019-12-01 NOTE — BH Specialist Note (Signed)
Pt did not arrive to video visit and did not answer the phone ; Left HIPPA-compliant message to call back Asher Muir from [facility] [employer] at [facility] at [phone] (main office) or [phone] (Kura Bethards's office).  ; left MyChart message for patient.    Integrated Behavioral Health via Telemedicine Video Visit  12/01/2019 BIANA HAGGAR [phone]  Rae Lips

## 2019-12-03 ENCOUNTER — Encounter (HOSPITAL_COMMUNITY): Payer: Self-pay | Admitting: Obstetrics & Gynecology

## 2019-12-03 ENCOUNTER — Inpatient Hospital Stay (HOSPITAL_COMMUNITY)
Admission: AD | Admit: 2019-12-03 | Discharge: 2019-12-03 | Disposition: A | Discharge status: Home / Self Care | Payer: Medicaid Other | Attending: Obstetrics & Gynecology | Admitting: Obstetrics & Gynecology

## 2019-12-03 DIAGNOSIS — D563 Thalassemia minor: Secondary | ICD-10-CM

## 2019-12-03 DIAGNOSIS — J45909 Unspecified asthma, uncomplicated: Secondary | ICD-10-CM | POA: Insufficient documentation

## 2019-12-03 DIAGNOSIS — Z3A30 30 weeks gestation of pregnancy: Secondary | ICD-10-CM

## 2019-12-03 DIAGNOSIS — O26893 Other specified pregnancy related conditions, third trimester: Secondary | ICD-10-CM | POA: Diagnosis not present

## 2019-12-03 DIAGNOSIS — F319 Bipolar disorder, unspecified: Secondary | ICD-10-CM

## 2019-12-03 DIAGNOSIS — R197 Diarrhea, unspecified: Secondary | ICD-10-CM | POA: Diagnosis not present

## 2019-12-03 DIAGNOSIS — Z87440 Personal history of urinary (tract) infections: Secondary | ICD-10-CM | POA: Insufficient documentation

## 2019-12-03 DIAGNOSIS — R109 Unspecified abdominal pain: Secondary | ICD-10-CM | POA: Diagnosis present

## 2019-12-03 DIAGNOSIS — R519 Headache, unspecified: Secondary | ICD-10-CM | POA: Diagnosis not present

## 2019-12-03 DIAGNOSIS — Z3689 Encounter for other specified antenatal screening: Secondary | ICD-10-CM

## 2019-12-03 DIAGNOSIS — O212 Late vomiting of pregnancy: Secondary | ICD-10-CM | POA: Diagnosis not present

## 2019-12-03 DIAGNOSIS — Z91018 Allergy to other foods: Secondary | ICD-10-CM | POA: Diagnosis not present

## 2019-12-03 DIAGNOSIS — O219 Vomiting of pregnancy, unspecified: Secondary | ICD-10-CM

## 2019-12-03 DIAGNOSIS — O99513 Diseases of the respiratory system complicating pregnancy, third trimester: Secondary | ICD-10-CM | POA: Diagnosis not present

## 2019-12-03 HISTORY — DX: Anemia, unspecified: D64.9

## 2019-12-03 LAB — BASIC METABOLIC PANEL
Anion gap: 9 (ref 5–15)
BUN: 5 mg/dL — ABNORMAL LOW (ref 6–20)
CO2: 20 mmol/L — ABNORMAL LOW (ref 22–32)
Calcium: 8.7 mg/dL — ABNORMAL LOW (ref 8.9–10.3)
Chloride: 107 mmol/L (ref 98–111)
Creatinine, Ser: 0.55 mg/dL (ref 0.44–1.00)
GFR calc Af Amer: >60 mL/min (ref >60)
GFR calc non Af Amer: >60 mL/min (ref >60)
Glucose, Bld: 97 mg/dL (ref 70–99)
Potassium: 4.2 mmol/L (ref 3.5–5.1)
Sodium: 136 mmol/L (ref 135–145)

## 2019-12-03 LAB — CBC WITH DIFFERENTIAL/PLATELET
Abs Immature Granulocytes: 0.08 10*3/uL — ABNORMAL HIGH (ref 0.00–0.07)
Basophils Absolute: 0 10*3/uL (ref 0.0–0.1)
Basophils Relative: 0 %
Eosinophils Absolute: 0.1 10*3/uL (ref 0.0–0.5)
Eosinophils Relative: 1 %
HCT: 29.7 % — ABNORMAL LOW (ref 36.0–46.0)
Hemoglobin: 9.3 g/dL — ABNORMAL LOW (ref 12.0–15.0)
Immature Granulocytes: 1 %
Lymphocytes Relative: 7 %
Lymphs Abs: 1 10*3/uL (ref 0.7–4.0)
MCH: 25.7 pg — ABNORMAL LOW (ref 26.0–34.0)
MCHC: 31.3 g/dL (ref 30.0–36.0)
MCV: 82 fL (ref 80.0–100.0)
Monocytes Absolute: 1.1 10*3/uL — ABNORMAL HIGH (ref 0.1–1.0)
Monocytes Relative: 9 %
Neutro Abs: 11.1 10*3/uL — ABNORMAL HIGH (ref 1.7–7.7)
Neutrophils Relative %: 82 %
Platelets: 202 10*3/uL (ref 150–400)
RBC: 3.62 MIL/uL — ABNORMAL LOW (ref 3.87–5.11)
RDW: 13.7 % (ref 11.5–15.5)
WBC: 13.4 10*3/uL — ABNORMAL HIGH (ref 4.0–10.5)
nRBC: 0 % (ref 0.0–0.2)

## 2019-12-03 LAB — URINALYSIS, ROUTINE W REFLEX MICROSCOPIC
Bilirubin Urine: NEGATIVE
Glucose, UA: NEGATIVE mg/dL
Hgb urine dipstick: NEGATIVE
Ketones, ur: 5 mg/dL — AB
Nitrite: NEGATIVE
Protein, ur: NEGATIVE mg/dL
Specific Gravity, Urine: 1.016 (ref 1.005–1.030)
pH: 7 (ref 5.0–8.0)

## 2019-12-03 MED ORDER — CALCIUM CARBONATE ANTACID 500 MG PO CHEW
400.0000 mg | CHEWABLE_TABLET | Freq: Once | ORAL | Status: AC
  Administered 2019-12-03: 400 mg via ORAL
  Filled 2019-12-03: qty 2

## 2019-12-03 MED ORDER — LOPERAMIDE HCL 2 MG PO CAPS
4.0000 mg | ORAL_CAPSULE | Freq: Once | ORAL | Status: AC
  Administered 2019-12-03: 4 mg via ORAL
  Filled 2019-12-03: qty 2

## 2019-12-03 MED ORDER — ONDANSETRON 4 MG PO TBDP
8.0000 mg | ORAL_TABLET | Freq: Once | ORAL | Status: AC
  Administered 2019-12-03: 8 mg via ORAL
  Filled 2019-12-03: qty 2

## 2019-12-03 NOTE — Discharge Instructions (Signed)
Norovirus Infection ° °Norovirus infection causes inflammation in the stomach and intestines (gastroenteritis) and food poisoning. It is caused by exposure to a virus in a group of similar viruses called noroviruses. °Norovirus spreads very easily from person to person (is very contagious). It often occurs in places where people are in close contact, such as schools, nursing homes, and cruise ships. You can get it from food, water, surfaces, or other people who have the virus (are contaminated). Norovirus is also found in the stool (feces) or vomit of infected people. You can spread the infection as soon as you feel sick, and you may continue to be contagious after you recover. °What are the causes? °This condition is caused by contact with norovirus. You can catch norovirus if you: °· Eat or drink something that is contaminated with norovirus. °· Touch surfaces or objects that are contaminated with norovirus and then put your hand in or by your mouth or nose. °· Have direct contact with an infected person who has symptoms. °· Share food, drink, or utensils with someone who is sick with norovirus. °What are the signs or symptoms? °Symptoms usually begin within 12 hours to 2 days after you become infected. Most norovirus symptoms affect the digestive system.Symptoms may include: °· Nausea. °· Vomiting. °· Diarrhea. °· Stomach cramps. °· Fever. °· Chills. °· Headache. °· Muscle aches. °· Tiredness. °How is this diagnosed? °This condition may be diagnosed based on: °· Your symptoms. °· A physical exam. °· A stool test. °How is this treated? °There is no specific treatment for norovirus. Most people get better without treatment in about 2 days. Young children, the elderly, and people who are already sick may take up to 6 days to recover. °Follow these instructions at home: °Eating and drinking °· Drink plenty of water to replace fluids that are lost through diarrhea and vomiting. This prevents dehydration. Drink enough  fluid to keep your urine clear or pale yellow. °· Drink clear fluids in small amounts as you are able. Clear fluids include water, ice chips, fruit juice with water added (diluted fruit juice), and low-calorie sports drinks. °? Avoid fluids that contain a lot of sugar or caffeine, such as energy drinks, sports drinks, and soda. °? Avoid alcohol. °· If instructed by your health care provider, drink an oral rehydration solution (ORS). This is a drink that is sold at pharmacies and retail stores. An ORS contains minerals (electrolytes) that you can lose through diarrhea and vomiting. °· Eat bland, easy-to-digest foods in small amounts as you are able. These foods include bananas, applesauce, rice, lean meats, toast, and crackers. °? Avoid spicy or fatty foods. °General instructions ° °· Rest at home while you recover. °· Do not prepare food for others while you are infected. Wait at least 3 days after you recover from the illness to do this. °· Take over-the-counter and prescription medicines only as told by your health care provider. °· Wash your hands frequently with soap and water. If soap and water are not available, use hand sanitizer. °· Make sure that all people in your household wash their hands well and often. °· Keep all follow-up visits as told by your health care provider. This is important. °How is this prevented? °To help prevent the spread of norovirus: °· Stay at home if you are feeling sick. This will reduce the risk of spreading the virus to others. °· Wash your hands often with soap and water for at least 20 seconds, especially after using the   toilet or changing a diaper. °· Wash fruits and vegetables thoroughly before peeling, preparing, or serving them. °· Throw out any food that a sick person may have touched. °· Disinfect contaminated surfaces immediately after someone in the household has been sick. Use a bleach-based household cleaner. °· Immediately remove and wash soiled clothes or  sheets. °Contact a health care provider if: °· You have vomiting, diarrhea, or stomach pain that gets worse. °· You have symptoms that do not go away after 2-6 days. °· You have a fever. °· You cannot drink without vomiting. °· You feel light-headed or dizzy. °· Your symptoms get worse. °Get help right away if: °You develop symptoms of dehydration that do not improve with fluid replacement, such as: °· Excessive sleepiness. °· Lack of tears. °· Very little urine production. °· Dry mouth. °· Muscle cramps. °· Weak pulse. °· Confusion. °Summary °· Norovirus infection is common and often occurs in places where people are in close contact, such as schools, nursing homes, and cruise ships. °· To help prevent the spread of this infection, wash hands with soap and water for at least 20 seconds before handling food or after having contact with stool or body fluids. °· There is no specific treatment for norovirus, but most people get better without treatment in about 2 days. People who are healthy when infected often recover sooner than those who are elderly, young, or already sick. °· Replace lost fluids by drinking plenty of water, or by drinking oral rehydration solution (ORS), which contains important minerals called electrolytes. This prevents dehydration. °This information is not intended to replace advice given to you by your health care provider. Make sure you discuss any questions you have with your health care provider. °Document Revised: 10/16/2018 Document Reviewed: 07/31/2016 °Elsevier Patient Education © 2020 Elsevier Inc. ° °

## 2019-12-03 NOTE — Progress Notes (Signed)
OK to d/c EFM per Gerrit Heck CNM

## 2019-12-03 NOTE — MAU Note (Signed)
About an hour ago vomited x1 and had loose BM. Had eaten cucumbers earlier and think that made me sick. Still feels "like something is in my throat". Pt feels may be heartburn. Chills. Slight headache. Occ abdominal pain. Denies LOF or VB

## 2019-12-03 NOTE — MAU Provider Note (Signed)
History     CSN: [id number]  Arrival date and time: 12/03/19 0345   First Provider Initiated Contact with Patient 12/03/19 [phone]      Chief Complaint  Patient presents with  . Abdominal Pain   Rose Schwartz is a 19 y.o. G1P0 at 76w6dwho receives care at [facility].  She presents today for Vomiting, Diarrhea, Chills, and HA.  She states her symptoms started at 0330 with the exception of her headache which started upon arrival to the hospital.  Patient states she had two incidents of vomiting in "the same time period."  Patient states she has a feeling of "something in your throat like indigestion."  She states this feeling contributed to her vomiting incidents.  She states she has had a lot of gas and when she had a bowel movement it was loose stool.  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 However, she denies being in the presence of sick children.  Breakfast: None  Lunch: TJordanSalad with Macaroni Noodles Grilled chicken sandwich and pasta salad Slice of watermelon Cookie  Dinner: "Plate full of Cucumbers that were covered in [place] and  left out for awhile" OJ     OB History    Gravida  1   Para      Term      Preterm      AB      Living        SAB      TAB      Ectopic      Multiple      Live Births              Past Medical History:  Diagnosis Date  . Anemia   . Asthma   . Bipolar 1 disorder ([place]   . UTI (urinary tract infection)     Past Surgical History:  Procedure Laterality Date  . FETAL AMNIOTIC BAND RELEASE      Family History  Problem Relation Age of Onset  . Fibromyalgia Mother     Social History   Tobacco Use  . Smoking status: Never Smoker  . Smokeless tobacco: Never Used  Substance Use Topics  . Alcohol use: Not Currently    Comment: SOCIAL  . Drug use: Not Currently    Types:  Marijuana    Comment: few years ago    Allergies:  Allergies  Allergen Reactions  . Other     Tree nuts    Medications Prior to Admission  Medication Sig Dispense Refill Last Dose  . albuterol (VENTOLIN HFA) 108 (90 Base) MCG/ACT inhaler Inhale 2 puffs into the lungs every 4 (four) hours as needed for wheezing or shortness of breath (cough, shortness of breath or wheezing.). 1 Inhaler 1 Past Week at Unknown time  . cetirizine (ZYRTEC ALLERGY) 10 MG tablet Take 1 tablet (10 mg total) by mouth daily. 60 tablet 1 Past Week at Unknown time  . cyclobenzaprine (FLEXERIL) 10 MG tablet Take 1 tablet (10 mg total) by mouth at bedtime as needed for muscle spasms. 30 tablet 2 Past Month at Unknown time  . Prenatal MV-Min-FA-Omega-3 (PRENATAL GUMMIES/DHA & FA) 0.4-32.5 MG CHEW Chew 2 each by mouth.     . Prenatal Vit-Fe Fumarate-FA (PREPLUS) 27-1 MG TABS Take 1 tablet by mouth daily. 30 tablet 6 12/02/2019 at Unknown time  . Blood Pressure  Monitoring (BLOOD PRESSURE KIT) DEVI 1 Device by Does not apply route as needed. 1 each 0   . cetirizine-pseudoephedrine (ZYRTEC-D) 5-120 MG tablet Take 1 tablet by mouth daily. 30 tablet 0   . EPINEPHrine 0.3 mg/0.3 mL IJ SOAJ injection AS DIRECTED AS NEEDED FOR SYSTEMIC REACTIONS INJECTION     . ferrous sulfate (FERROUSUL) 325 (65 FE) MG tablet Take 1 tablet (325 mg total) by mouth 2 (two) times daily. 60 tablet 1     Review of Systems  Constitutional: Positive for chills. Negative for fever.  Respiratory: Negative for cough and shortness of breath.   Gastrointestinal: Positive for diarrhea, nausea and vomiting. Negative for constipation.  Genitourinary: Negative for difficulty urinating, dysuria, vaginal bleeding and vaginal discharge.  Neurological: Positive for headaches. Negative for dizziness and light-headedness.   Physical Exam   Blood pressure 116/72, pulse (!) 115, temperature 98.6 F (37 C), resp. rate 18, height _0  (1.549 m), weight 66.7 kg,  last menstrual period 04/22/2019, SpO2 100 %.  Physical Exam  Constitutional: She is oriented to person, place, and time. She appears well-developed and well-nourished.  HENT:  Head: Normocephalic and atraumatic.  Eyes: Conjunctivae are normal.  Cardiovascular: Normal rate, regular rhythm and normal heart sounds.  Respiratory: Effort normal and breath sounds normal.  GI: Soft. There is no abdominal tenderness.  Gravid--fundal height appears AGA, Soft, NT   Musculoskeletal:        General: No edema. Normal range of motion.     Cervical back: Normal range of motion.  Neurological: She is alert and oriented to person, place, and time.  Skin: Skin is warm and dry.  Psychiatric: She has a normal mood and affect. Her behavior is normal.    Fetal Assessment 150 bpm, Mod Var, -Decels, +Accels Toco: No ctx  MAU Course   Results for orders placed or performed during the hospital encounter of 12/03/19 (from the past 24 hour(s))  Urinalysis, Routine w reflex microscopic     Status: Abnormal   Collection Time: 12/03/19  4:14 AM  Result Value Ref Range   Color, Urine YELLOW YELLOW   APPearance HAZY (A) CLEAR   Specific Gravity, Urine 1.016 1.005 - 1.030   pH 7.0 5.0 - 8.0   Glucose, UA NEGATIVE NEGATIVE mg/dL   Hgb urine dipstick NEGATIVE NEGATIVE   Bilirubin Urine NEGATIVE NEGATIVE   Ketones, ur 5 (A) NEGATIVE mg/dL   Protein, ur NEGATIVE NEGATIVE mg/dL   Nitrite NEGATIVE NEGATIVE   Leukocytes,Ua TRACE (A) NEGATIVE   RBC / HPF 0-5 0 - 5 RBC/hpf   WBC, UA 0-5 0 - 5 WBC/hpf   Bacteria, UA RARE (A) NONE SEEN   Squamous Epithelial / LPF 0-5 0 - 5   Mucus PRESENT   CBC with Differential/Platelet     Status: Abnormal   Collection Time: 12/03/19  4:42 AM  Result Value Ref Range   WBC 13.4 (H) 4.0 - 10.5 K/uL   RBC 3.62 (L) 3.87 - 5.11 MIL/uL   Hemoglobin 9.3 (L) 12.0 - 15.0 g/dL   HCT 29.7 (L) 36.0 - 46.0 %   MCV 82.0 80.0 - 100.0 fL   MCH 25.7 (L) 26.0 - 34.0 pg   MCHC 31.3 30.0 -  36.0 g/dL   RDW 13.7 11.5 - 15.5 %   Platelets 202 150 - 400 K/uL   nRBC 0.0 0.0 - 0.2 %   Neutrophils Relative % 82 %   Neutro Abs 11.1 (H) 1.7 - 7.7 K/uL  Lymphocytes Relative 7 %   Lymphs Abs 1.0 0.7 - 4.0 K/uL   Monocytes Relative 9 %   Monocytes Absolute 1.1 (H) 0.1 - 1.0 K/uL   Eosinophils Relative 1 %   Eosinophils Absolute 0.1 0.0 - 0.5 K/uL   Basophils Relative 0 %   Basophils Absolute 0.0 0.0 - 0.1 K/uL   Immature Granulocytes 1 %   Abs Immature Granulocytes 0.08 (H) 0.00 - 0.07 K/uL  Basic metabolic panel     Status: Abnormal   Collection Time: 12/03/19  4:42 AM  Result Value Ref Range   Sodium 136 135 - 145 mmol/L   Potassium 4.2 3.5 - 5.1 mmol/L   Chloride 107 98 - 111 mmol/L   CO2 20 (L) 22 - 32 mmol/L   Glucose, Bld 97 70 - 99 mg/dL   BUN 5 (L) 6 - 20 mg/dL   Creatinine, Ser 0.55 0.44 - 1.00 mg/dL   Calcium 8.7 (L) 8.9 - 10.3 mg/dL   GFR calc non Af Amer >60 >60 mL/min   GFR calc Af Amer >60 >60 mL/min   Anion gap 9 5 - 15   No results found.  MDM PE Labs: CBC, BMP, UA  EFM AntiDiarrheal AntiEmetic Assessment and Plan  19 year old G1P0  SIUP at 30.6weeks Cat I FT Nausea/Vomiting  -POC reviewed.  -Will give medications for symptom treatment: *Zofran 30m  *Imodium 439m*Tums -Reviewed lab findings. -Informed of need to take prescribed iron supplement twice due to low HgB. -Extensive education given regarding iron level, supplement, and Alpha Thalassemia.  -Plan for repeat H/H in office at 36 weeks.  -Exam performed and findings discussed. -Message sent to office for scheduling of a virtual PNV. -Patient encouraged to make time for appts.  -Will reassess and discharge as appropriate. -NST Reactive, okay to discontinue EFM.   JeMaryann ConnersSN, CNM 12/03/2019, 5:04 AM   Reassessment (6:20 AM)  -Nurse reports patient asleep in room. -Will discharge to home. -Okay for OOW today. -Discharged to home in stable condition.  JeMaryann ConnersMSN, CNM Advanced Practice Provider, [facility] [employer]

## 2019-12-03 NOTE — Progress Notes (Signed)
Pt states she feels much better. Written and verbal d/c instructions given and understanding voiced

## 2019-12-07 ENCOUNTER — Other Ambulatory Visit: Payer: Self-pay

## 2019-12-07 ENCOUNTER — Ambulatory Visit: Payer: Medicaid Other | Admitting: Clinical

## 2019-12-07 DIAGNOSIS — Z91199 Patient's noncompliance with other medical treatment and regimen due to unspecified reason: Secondary | ICD-10-CM

## 2019-12-14 ENCOUNTER — Ambulatory Visit: Payer: Medicaid Other | Admitting: Clinical

## 2019-12-14 DIAGNOSIS — Z91199 Patient's noncompliance with other medical treatment and regimen due to unspecified reason: Secondary | ICD-10-CM

## 2019-12-14 NOTE — BH Specialist Note (Signed)
Pt did not arrive to video visit and did not answer the phone ; Left HIPPA-compliant message to call back Asher Muir from [facility] [employer] at [facility] at [phone] (main office) or [phone] (Donney Caraveo's office).  ; left MyChart message for patient.    Integrated Behavioral Health via Telemedicine Video Visit  12/14/2019 SMT. LODER [id number]  [facility]  Depression screen [facility] 2/9 11/16/2019 11/16/2019 10/14/2019 09/30/2019 07/14/2019  Decreased Interest 1 1 1  0 2  Down, Depressed, Hopeless 1 1 1  0 1  PHQ - 2 Score 2 2 2  0 3  Altered sleeping 1 1 3 2 1   Tired, decreased energy 2 2 2 3 3   Change in appetite 2 2 1 3 3   Feeling bad or failure about yourself  2 1 1  0 1  Trouble concentrating 2 2 2 1 1   Moving slowly or fidgety/restless 0 0 0 1 0  Suicidal thoughts 0 0 0 0 0  PHQ-9 Score 11 10 11 10 12    GAD 7 : Generalized Anxiety Score 11/16/2019 11/16/2019 10/14/2019 09/30/2019  Nervous, Anxious, on Edge 1 1 1 3   Control/stop worrying 1 2 1 3   Worry too much - different things 1 2 1 3   Trouble relaxing 0 0 1 3  Restless 0 0 0 0  Easily annoyed or irritable 3 3 2 3   Afraid - awful might happen 1 1 1 1   Total GAD 7 Score 7 9 7  16

## 2019-12-21 ENCOUNTER — Other Ambulatory Visit: Payer: Self-pay

## 2019-12-21 ENCOUNTER — Other Ambulatory Visit: Payer: Self-pay | Admitting: Student

## 2019-12-21 ENCOUNTER — Encounter (HOSPITAL_COMMUNITY): Payer: Self-pay | Admitting: Obstetrics & Gynecology

## 2019-12-21 ENCOUNTER — Telehealth (INDEPENDENT_AMBULATORY_CARE_PROVIDER_SITE_OTHER): Payer: Medicaid Other | Admitting: Nurse Practitioner

## 2019-12-21 ENCOUNTER — Inpatient Hospital Stay (HOSPITAL_COMMUNITY)
Admission: AD | Admit: 2019-12-21 | Discharge: 2019-12-21 | Disposition: A | Discharge status: Home / Self Care | Payer: Medicaid Other | Attending: Obstetrics & Gynecology | Admitting: Obstetrics & Gynecology

## 2019-12-21 ENCOUNTER — Telehealth: Payer: Self-pay

## 2019-12-21 DIAGNOSIS — Z3A00 Weeks of gestation of pregnancy not specified: Secondary | ICD-10-CM

## 2019-12-21 DIAGNOSIS — Z87440 Personal history of urinary (tract) infections: Secondary | ICD-10-CM | POA: Insufficient documentation

## 2019-12-21 DIAGNOSIS — J45909 Unspecified asthma, uncomplicated: Secondary | ICD-10-CM | POA: Diagnosis not present

## 2019-12-21 DIAGNOSIS — Z79899 Other long term (current) drug therapy: Secondary | ICD-10-CM | POA: Insufficient documentation

## 2019-12-21 DIAGNOSIS — O99019 Anemia complicating pregnancy, unspecified trimester: Secondary | ICD-10-CM | POA: Diagnosis not present

## 2019-12-21 DIAGNOSIS — F319 Bipolar disorder, unspecified: Secondary | ICD-10-CM | POA: Insufficient documentation

## 2019-12-21 DIAGNOSIS — D509 Iron deficiency anemia, unspecified: Secondary | ICD-10-CM

## 2019-12-21 DIAGNOSIS — Z91199 Patient's noncompliance with other medical treatment and regimen due to unspecified reason: Secondary | ICD-10-CM

## 2019-12-21 DIAGNOSIS — Z5329 Procedure and treatment not carried out because of patient's decision for other reasons: Secondary | ICD-10-CM

## 2019-12-21 LAB — URINALYSIS, ROUTINE W REFLEX MICROSCOPIC
Bilirubin Urine: NEGATIVE
Glucose, UA: NEGATIVE mg/dL
Hgb urine dipstick: NEGATIVE
Ketones, ur: NEGATIVE mg/dL
Nitrite: NEGATIVE
Protein, ur: NEGATIVE mg/dL
Specific Gravity, Urine: 1.014 (ref 1.005–1.030)
pH: 6 (ref 5.0–8.0)

## 2019-12-21 LAB — CBC
HCT: 27.5 % — ABNORMAL LOW (ref 36.0–46.0)
Hemoglobin: 8.6 g/dL — ABNORMAL LOW (ref 12.0–15.0)
MCH: 25.1 pg — ABNORMAL LOW (ref 26.0–34.0)
MCHC: 31.3 g/dL (ref 30.0–36.0)
MCV: 80.2 fL (ref 80.0–100.0)
Platelets: 211 10*3/uL (ref 150–400)
RBC: 3.43 MIL/uL — ABNORMAL LOW (ref 3.87–5.11)
RDW: 14.4 % (ref 11.5–15.5)
WBC: 15.2 10*3/uL — ABNORMAL HIGH (ref 4.0–10.5)
nRBC: 0 % (ref 0.0–0.2)

## 2019-12-21 MED ORDER — SODIUM CHLORIDE 0.9 % IV SOLN: 510.0000 mg | INTRAVENOUS | Status: DC

## 2019-12-21 NOTE — MAU Note (Signed)
Pt reports no headache now, but feels dizzy.

## 2019-12-21 NOTE — MAU Note (Signed)
Pt reports experiencing cramping and dizziness around 1015-1145.   Pt reports that the cramping and dizziness are gone now.   Pt reports a headache that started on the way over here.   Denies vaginal bleeding or LOF.   Reports +FM

## 2019-12-21 NOTE — MAU Provider Note (Addendum)
[name] is a 19 y.o. G1P0 At here with complaints of dizziness and occasional SOB that lasts a minute or two. It is not on-going. She denies blurry vision, floating spots, HA. She denies VB or LOF.  She did notice some cramping today but it is gone now.   She is anemic but finds it hard to take her iron pills because they "make her feel weird".   Today she had blueberries for breakfast, water, and for lunch she had chicken nuggets, fruit and broccoli. She reports that she has poor eating habits; doesn't always eat regular meals.    History     CSN: [id number]  Arrival date and time: 12/21/19 1414   None     Chief Complaint  Patient presents with  . Headache   Dizziness This is a new problem. The current episode started today. The problem occurs intermittently. The problem has been resolved. Pertinent negatives include no abdominal pain, coughing, fever or visual change.  She said it was "really weird".  She drove herself here. 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 She felt like she was "on drugs".  OB History    Gravida  1   Para      Term      Preterm      AB      Living        SAB      TAB      Ectopic      Multiple      Live Births              Past Medical History:  Diagnosis Date  . Anemia   . Asthma   . Bipolar 1 disorder ([place])   . UTI (urinary tract infection)     Past Surgical History:  Procedure Laterality Date  . FETAL AMNIOTIC BAND RELEASE      Family History  Problem Relation Age of Onset  . Fibromyalgia Mother     Social History   Tobacco Use  . Smoking status: Never Smoker  . Smokeless tobacco: Never Used  Vaping Use  . Vaping Use: Never used  Substance Use Topics  . Alcohol use: Not Currently    Comment: SOCIAL  . Drug use: Not Currently    Types: Marijuana    Comment: few years ago    Allergies:  Allergies  Allergen Reactions  . Other  Anaphylaxis and Shortness Of Breath    Tree nuts    Medications Prior to Admission  Medication Sig Dispense Refill Last Dose  . albuterol (VENTOLIN HFA) 108 (90 Base) MCG/ACT inhaler Inhale 2 puffs into the lungs every 4 (four) hours as needed for wheezing or shortness of breath (cough, shortness of breath or wheezing.). 1 Inhaler 1 Past Month at Unknown time  . ferrous sulfate (FERROUSUL) 325 (65 FE) MG tablet Take 1 tablet (325 mg total) by mouth 2 (two) times daily. 60 tablet 1 Past Week at Unknown time  . Prenatal Vit-Fe Fumarate-FA (PREPLUS) 27-1 MG TABS Take 1 tablet by mouth daily. 30 tablet 6 12/20/2019 at Unknown time  . Blood Pressure Monitoring (BLOOD PRESSURE KIT) DEVI 1 Device by Does not apply route as needed. 1 each 0   . cetirizine (ZYRTEC ALLERGY) 10 MG tablet Take 1 tablet (10 mg total) by mouth daily. 60 tablet 1 More than a month at Unknown time  . cetirizine-pseudoephedrine (ZYRTEC-D) 5-120 MG tablet Take  1 tablet by mouth daily. 30 tablet 0 More than a month at Unknown time  . cyclobenzaprine (FLEXERIL) 10 MG tablet Take 1 tablet (10 mg total) by mouth at bedtime as needed for muscle spasms. 30 tablet 2 More than a month at Unknown time  . EPINEPHrine 0.3 mg/0.3 mL IJ SOAJ injection AS DIRECTED AS NEEDED FOR SYSTEMIC REACTIONS INJECTION     . Prenatal MV-Min-FA-Omega-3 (PRENATAL GUMMIES/DHA & FA) 0.4-32.5 MG CHEW Chew 2 each by mouth.       Review of Systems  Constitutional: Negative for fever.  Respiratory: Negative for cough.   Gastrointestinal: Negative for abdominal pain.  Genitourinary: Negative.   Musculoskeletal: Negative.   Neurological: Positive for dizziness.   Physical Exam   Blood pressure 119/73, pulse (!) 114, temperature 98.4 F (36.9 C), resp. rate 18, last menstrual period 04/22/2019, SpO2 100 %.  Physical Exam  HENT:  Head: Normocephalic.  Respiratory: Effort normal.  GI: Soft.  Musculoskeletal:     Cervical back: Normal range of motion.   Neurological: She is alert.  Skin: Skin is warm.  Psychiatric: Mood normal.    MAU Course  Procedures  MDM -NST: 150 bpm, mod var, present acel, negative decels, uterine irratability. Patient denies contractions; cervical exam not done.  -crackers or juice given in MAU> patient feels better, no SOB, HA, dizziness.   -check CBC> Hgb now 8.6; UA negative for signs of dehydration.   Patient alert and talkative in MAU; thinks it could be her anxiety that is making her dizzy.   Assessment and Plan   1. Iron deficiency anemia, unspecified iron deficiency anemia type     -Will schedule patient for outpatient feraheme infusion. Order placed and message sent to [facility] clinical pool to schedule patient.  -increase iron rich foods; increase breakfast, snacks and healthy lunch.  -All questions answered; patient will return if her symptoms worsen or change.   Mervyn Skeeters Mariyanna Mucha 12/21/2019, 4:17 PM

## 2019-12-21 NOTE — Progress Notes (Deleted)
I connected with  Rose Schwartz on 12/21/19 at  1:35 PM EDT by telephone and verified that I am speaking with the correct person using two identifiers.   I discussed the limitations, risks, security and privacy concerns of performing an evaluation and management service by telephone and the availability of in person appointments. I also discussed with the patient that there may be a patient responsible charge related to this service. The patient expressed understanding and agreed to proceed.  Henrietta Dine, CMA 12/21/2019  2:01 PM

## 2019-12-21 NOTE — Progress Notes (Signed)
Called Pt for My Chart visit, states was currently in MAUs parking lot getting ready to go in because she is experiencing dizziness & blurred vision. Advised will re-schedule appt, Pt verbalized understanding.

## 2019-12-21 NOTE — Discharge Instructions (Signed)

## 2019-12-21 NOTE — Telephone Encounter (Signed)
Per Loma Newton, CNM pt needs Feraheme infusion x 2.  L/M for Short Stay to call us with an appt.    Addison Naegeli, RN

## 2019-12-22 NOTE — Telephone Encounter (Signed)
Receive call from the from Short Stay that pt has been scheduled for June 23rd @ 0800.  L/M that I am calling with her appt for iron infusion for stated date.  If she could please give the office a call if she has any questions or concerns.  Her appt information is in MyChart.    Addison Naegeli, RN  12/22/19

## 2019-12-28 NOTE — Discharge Instructions (Signed)

## 2019-12-29 ENCOUNTER — Encounter (HOSPITAL_COMMUNITY)
Admission: RE | Admit: 2019-12-29 | Discharge: 2019-12-29 | Disposition: A | Discharge status: Home / Self Care | Payer: Medicaid Other | Source: Ambulatory Visit | Attending: Family Medicine | Admitting: Family Medicine

## 2019-12-29 ENCOUNTER — Other Ambulatory Visit: Payer: Self-pay

## 2019-12-29 DIAGNOSIS — O99013 Anemia complicating pregnancy, third trimester: Secondary | ICD-10-CM | POA: Insufficient documentation

## 2019-12-29 DIAGNOSIS — D649 Anemia, unspecified: Secondary | ICD-10-CM | POA: Insufficient documentation

## 2019-12-29 DIAGNOSIS — Z3A00 Weeks of gestation of pregnancy not specified: Secondary | ICD-10-CM | POA: Insufficient documentation

## 2019-12-30 ENCOUNTER — Encounter (HOSPITAL_COMMUNITY): Payer: Self-pay | Admitting: Obstetrics and Gynecology

## 2019-12-30 ENCOUNTER — Inpatient Hospital Stay (HOSPITAL_COMMUNITY)
Admission: AD | Admit: 2019-12-30 | Discharge: 2019-12-30 | Disposition: A | Discharge status: Home / Self Care | Payer: Medicaid Other | Source: Ambulatory Visit | Attending: Obstetrics and Gynecology | Admitting: Obstetrics and Gynecology

## 2019-12-30 DIAGNOSIS — O99513 Diseases of the respiratory system complicating pregnancy, third trimester: Secondary | ICD-10-CM | POA: Insufficient documentation

## 2019-12-30 DIAGNOSIS — O26893 Other specified pregnancy related conditions, third trimester: Secondary | ICD-10-CM | POA: Insufficient documentation

## 2019-12-30 DIAGNOSIS — R82998 Other abnormal findings in urine: Secondary | ICD-10-CM | POA: Diagnosis not present

## 2019-12-30 DIAGNOSIS — J45909 Unspecified asthma, uncomplicated: Secondary | ICD-10-CM | POA: Insufficient documentation

## 2019-12-30 DIAGNOSIS — O4703 False labor before 37 completed weeks of gestation, third trimester: Secondary | ICD-10-CM | POA: Diagnosis not present

## 2019-12-30 DIAGNOSIS — O99891 Other specified diseases and conditions complicating pregnancy: Secondary | ICD-10-CM | POA: Diagnosis not present

## 2019-12-30 DIAGNOSIS — Z79899 Other long term (current) drug therapy: Secondary | ICD-10-CM | POA: Insufficient documentation

## 2019-12-30 DIAGNOSIS — Z3A34 34 weeks gestation of pregnancy: Secondary | ICD-10-CM | POA: Diagnosis not present

## 2019-12-30 DIAGNOSIS — R109 Unspecified abdominal pain: Secondary | ICD-10-CM | POA: Insufficient documentation

## 2019-12-30 LAB — URINALYSIS, ROUTINE W REFLEX MICROSCOPIC
Bilirubin Urine: NEGATIVE
Glucose, UA: NEGATIVE mg/dL
Hgb urine dipstick: NEGATIVE
Ketones, ur: NEGATIVE mg/dL
Nitrite: NEGATIVE
Protein, ur: NEGATIVE mg/dL
Specific Gravity, Urine: 1.012 (ref 1.005–1.030)
pH: 7 (ref 5.0–8.0)

## 2019-12-30 MED ORDER — CEPHALEXIN 500 MG PO CAPS
500.0000 mg | ORAL_CAPSULE | Freq: Four times a day (QID) | ORAL | 2 refills | Status: DC
Start: 2019-12-30 — End: 2020-01-19

## 2019-12-30 NOTE — MAU Note (Signed)
Pt reports to MAU c/o frequent painful ctx. Pt states the pain is a 8/10. No bleeding or LOF. +FM.

## 2019-12-30 NOTE — Discharge Instructions (Signed)
Braxton Hicks Contractions Contractions of the uterus can occur throughout pregnancy, but they are not always a sign that you are in labor. You may have practice contractions called Braxton Hicks contractions. These false labor contractions are sometimes confused with true labor. What are Braxton Hicks contractions? Braxton Hicks contractions are tightening movements that occur in the muscles of the uterus before labor. Unlike true labor contractions, these contractions do not result in opening (dilation) and thinning of the cervix. Toward the end of pregnancy (32-34 weeks), Braxton Hicks contractions can happen more often and may become stronger. These contractions are sometimes difficult to tell apart from true labor because they can be very uncomfortable. You should not feel embarrassed if you go to the hospital with false labor. Sometimes, the only way to tell if you are in true labor is for your health care provider to look for changes in the cervix. The health care provider will do a physical exam and may monitor your contractions. If you are not in true labor, the exam should show that your cervix is not dilating and your water has not broken. If there are no other health problems associated with your pregnancy, it is completely safe for you to be sent home with false labor. You may continue to have Braxton Hicks contractions until you go into true labor. How to tell the difference between true labor and false labor True labor  Contractions last 30-70 seconds.  Contractions become very regular.  Discomfort is usually felt in the top of the uterus, and it spreads to the lower abdomen and low back.  Contractions do not go away with walking.  Contractions usually become more intense and increase in frequency.  The cervix dilates and gets thinner. False labor  Contractions are usually shorter and not as strong as true labor contractions.  Contractions are usually irregular.  Contractions  are often felt in the front of the lower abdomen and in the groin.  Contractions may go away when you walk around or change positions while lying down.  Contractions get weaker and are shorter-lasting as time goes on.  The cervix usually does not dilate or become thin. Follow these instructions at home:   Take over-the-counter and prescription medicines only as told by your health care provider.  Keep up with your usual exercises and follow other instructions from your health care provider.  Eat and drink lightly if you think you are going into labor.  If Braxton Hicks contractions are making you uncomfortable: ? Change your position from lying down or resting to walking, or change from walking to resting. ? Sit and rest in a tub of warm water. ? Drink enough fluid to keep your urine pale yellow. Dehydration may cause these contractions. ? Do slow and deep breathing several times an hour.  Keep all follow-up prenatal visits as told by your health care provider. This is important. Contact a health care provider if:  You have a fever.  You have continuous pain in your abdomen. Get help right away if:  Your contractions become stronger, more regular, and closer together.  You have fluid leaking or gushing from your vagina.  You pass blood-tinged mucus (bloody show).  You have bleeding from your vagina.  You have low back pain that you never had before.  You feel your baby's head pushing down and causing pelvic pressure.  Your baby is not moving inside you as much as it used to. Summary  Contractions that occur before labor are   called Braxton Hicks contractions, false labor, or practice contractions.  Braxton Hicks contractions are usually shorter, weaker, farther apart, and less regular than true labor contractions. True labor contractions usually become progressively stronger and regular, and they become more frequent.  Manage discomfort from [facility] contractions  by changing position, resting in a warm bath, drinking plenty of water, or practicing deep breathing. This information is not intended to replace advice given to you by your health care provider. Make sure you discuss any questions you have with your health care provider. Document Revised: 06/06/2017 Document Reviewed: 11/07/2016 Elsevier Patient Education  [employer].  Pregnancy and Urinary Tract Infection  A urinary tract infection (UTI) is an infection of any part of the urinary tract. This includes the kidneys, the tubes that connect your kidneys to your bladder (ureters), the bladder, and the tube that carries urine out of your body (urethra). These organs make, store, and get rid of urine in the body. Your health care provider may use other names to describe the infection. An upper UTI affects the ureters and kidneys (pyelonephritis). A lower UTI affects the bladder (cystitis) and urethra (urethritis). Most urinary tract infections are caused by bacteria in your genital area, around the entrance to your urinary tract (urethra). These bacteria grow and cause irritation and inflammation of your urinary tract. You are more likely to develop a UTI during pregnancy because the physical and hormonal changes your body goes through can make it easier for bacteria to get into your urinary tract. Your growing baby also puts pressure on your bladder and can affect urine flow. It is important to recognize and treat UTIs in pregnancy because of the risk of serious complications for both you and your baby. How does this affect me? Symptoms of a UTI include:  Needing to urinate right away (urgently).  Frequent urination or passing small amounts of urine frequently.  Pain or burning with urination.  Blood in the urine.  Urine that smells bad or unusual.  Trouble urinating.  Cloudy urine.  Pain in the abdomen or lower back.  Vaginal discharge. You may also have:  Vomiting or a decreased  appetite.  Confusion.  Irritability or tiredness.  A fever.  Diarrhea. How does this affect my baby? An untreated UTI during pregnancy could lead to a kidney infection or a systemic infection, which can cause health problems that could affect your baby. Possible complications of an untreated UTI include:  Giving birth to your baby before 37 weeks of pregnancy (premature).  Having a baby with a low birth weight.  Developing high blood pressure during pregnancy (preeclampsia).  Having a low hemoglobin level (anemia). What can I do to lower my risk? To prevent a UTI:  Go to the bathroom as soon as you feel the need. Do not hold urine for long periods of time.  Always wipe from front to back, especially after a bowel movement. Use each tissue one time when you wipe.  Empty your bladder after sex.  Keep your genital area dry.  Drink 6-10 glasses of water each day.  Do not douche or use deodorant sprays. How is this treated? Treatment for this condition may include:  Antibiotic medicines that are safe to take during pregnancy.  Other medicines to treat less common causes of UTI. Follow these instructions at home:  If you were prescribed an antibiotic medicine, take it as told by your health care provider. Do not stop using the antibiotic even if you start  to feel better.  Keep all follow-up visits as told by your health care provider. This is important. Contact a health care provider if:  Your symptoms do not improve or they get worse.  You have abnormal vaginal discharge. Get help right away if you:  Have a fever.  Have nausea and vomiting.  Have back or side pain.  Feel contractions in your uterus.  Have lower belly pain.  Have a gush of fluid from your vagina.  Have blood in your urine. Summary  A urinary tract infection (UTI) is an infection of any part of the urinary tract, which includes the kidneys, ureters, bladder, and urethra.  Most urinary  tract infections are caused by bacteria in your genital area, around the entrance to your urinary tract (urethra).  You are more likely to develop a UTI during pregnancy.  If you were prescribed an antibiotic medicine, take it as told by your health care provider. Do not stop using the antibiotic even if you start to feel better. This information is not intended to replace advice given to you by your health care provider. Make sure you discuss any questions you have with your health care provider. Document Revised: 10/16/2018 Document Reviewed: 05/28/2018 Elsevier Patient Education  2020 [employer].

## 2019-12-30 NOTE — MAU Provider Note (Signed)
Chief Complaint:  Contractions   First Provider Initiated Contact with Patient 12/30/19 [phone]     HPI: Rose Schwartz is a 19 y.o. G1P0 at 24w5dho presents to maternity admissions reporting painful uterine contractions throughout the night. Kept waking her up.  They have significantly diminished upon arrival here.  No complications with the pregnancy so far.. She reports good fetal movement, denies LOF, vaginal bleeding, vaginal itching/burning, urinary symptoms, h/a, dizziness, n/v, diarrhea, constipation or fever/chills.  She denies headache, visual changes or RUQ abdominal pain.  Abdominal Pain This is a new problem. The current episode started today. The problem occurs intermittently. The problem has been rapidly improving. The pain is moderate. The quality of the pain is cramping. The abdominal pain does not radiate. Pertinent negatives include no constipation, diarrhea, dysuria, fever, frequency, headaches, myalgias, nausea or vomiting. Nothing aggravates the pain. The pain is relieved by nothing. She has tried nothing for the symptoms.    RN Note: Pt reports to MAU c/o frequent painful ctx. Pt states the pain is a 8/10. No bleeding or LOF. +FM.  Past Medical History: Past Medical History:  Diagnosis Date  . Anemia   . Asthma   . Bipolar 1 disorder ([place]   . UTI (urinary tract infection)     Past obstetric history: OB History  Gravida Para Term Preterm AB Living  1            SAB TAB Ectopic Multiple Live Births               # Outcome Date GA Lbr Len/2nd Weight Sex Delivery Anes PTL Lv  1 Current             Past Surgical History: Past Surgical History:  Procedure Laterality Date  . FETAL AMNIOTIC BAND RELEASE      Family History: Family History  Problem Relation Age of Onset  . Fibromyalgia Mother     Social History: Social History   Tobacco Use  . Smoking status: Never Smoker  . Smokeless tobacco: Never Used  Vaping Use  . Vaping Use: Never used   Substance Use Topics  . Alcohol use: Not Currently    Comment: SOCIAL  . Drug use: Not Currently    Types: Marijuana    Comment: few years ago    Allergies:  Allergies  Allergen Reactions  . Other Anaphylaxis and Shortness Of Breath    Tree nuts    Meds:  Medications Prior to Admission  Medication Sig Dispense Refill Last Dose  . albuterol (VENTOLIN HFA) 108 (90 Base) MCG/ACT inhaler Inhale 2 puffs into the lungs every 4 (four) hours as needed for wheezing or shortness of breath (cough, shortness of breath or wheezing.). 1 Inhaler 1   . Blood Pressure Monitoring (BLOOD PRESSURE KIT) DEVI 1 Device by Does not apply route as needed. 1 each 0   . cetirizine (ZYRTEC ALLERGY) 10 MG tablet Take 1 tablet (10 mg total) by mouth daily. 60 tablet 1   . EPINEPHrine 0.3 mg/0.3 mL IJ SOAJ injection AS DIRECTED AS NEEDED FOR SYSTEMIC REACTIONS INJECTION     . ferrous sulfate (FERROUSUL) 325 (65 FE) MG tablet Take 1 tablet (325 mg total) by mouth 2 (two) times daily. 60 tablet 1   . Prenatal MV-Min-FA-Omega-3 (PRENATAL GUMMIES/DHA & FA) 0.4-32.5 MG CHEW Chew 2 each by mouth.     . Prenatal Vit-Fe Fumarate-FA (PREPLUS) 27-1 MG TABS Take 1 tablet by mouth daily. 30 tablet 6  I have reviewed patient's Past Medical Hx, Surgical Hx, Family Hx, Social Hx, medications and allergies.   ROS:  Review of Systems  Constitutional: Negative for fever.  Gastrointestinal: Positive for abdominal pain. Negative for constipation, diarrhea, nausea and vomiting.  Genitourinary: Negative for dysuria and frequency.  Musculoskeletal: Negative for myalgias.  Neurological: Negative for headaches.   Other systems negative  Physical Exam   Vitals:   12/30/19 0620 12/30/19 0658  BP: (!) 101/58 108/70  Pulse: (!) 118 98  Resp:  16  SpO2: 100%    Constitutional: Well-developed, well-nourished female in no acute distress.  Cardiovascular: normal rate and rhythm Respiratory: normal effort, clear to  auscultation bilaterally GI: Abd soft, non-tender, gravid appropriate for gestational age.   No rebound or guarding. MS: Extremities nontender, no edema, normal ROM Neurologic: Alert and oriented x 4.  GU: Neg CVAT.  PELVIC EXAM: Dilation: Closed Effacement (%): Thick Exam by:: Hansel Feinstein, CNM Cervix is soft, difficult exam   FHT:  Baseline 140 , moderate variability, accelerations present, no decelerations Contractions: Rare (2 per hour)   Labs: Results for orders placed or performed during the hospital encounter of 12/30/19 (from the past 24 hour(s))  Urinalysis, Routine w reflex microscopic     Status: Abnormal   Collection Time: 12/30/19  6:33 AM  Result Value Ref Range   Color, Urine YELLOW YELLOW   APPearance HAZY (A) CLEAR   Specific Gravity, Urine 1.012 1.005 - 1.030   pH 7.0 5.0 - 8.0   Glucose, UA NEGATIVE NEGATIVE mg/dL   Hgb urine dipstick NEGATIVE NEGATIVE   Bilirubin Urine NEGATIVE NEGATIVE   Ketones, ur NEGATIVE NEGATIVE mg/dL   Protein, ur NEGATIVE NEGATIVE mg/dL   Nitrite NEGATIVE NEGATIVE   Leukocytes,Ua LARGE (A) NEGATIVE   RBC / HPF 0-5 0 - 5 RBC/hpf   WBC, UA 0-5 0 - 5 WBC/hpf   Bacteria, UA RARE (A) NONE SEEN   Squamous Epithelial / LPF 6-10 0 - 5   Mucus PRESENT     O/Positive/-- (01/07 6701)  Imaging:  No results found.  MAU Course/MDM: I have ordered labs and reviewed results. Urine is clear but has large leukocytes.  Sent for culture. Will Rx Keflex and pt given option of starting med or waiting for culture results. NST reviewed, reactive category I .  Treatments in MAU included EFM, PO hydration.    Assessment: [place] IUP at 45w5dPreterm uterine contractions, mostly resolved  Plan: Discharge home Preterm Labor precautions and fetal kick counts Rx Keflex for possible UTI Urine to culture Follow up in Office for prenatal visits   Encouraged to return here or to other Urgent Care/ED if she develops worsening of symptoms, increase  in pain, fever, or other concerning symptoms.  Pt stable at time of discharge.  MHansel FeinsteinCNM, MSN Certified Nurse-Midwife 12/30/2019 6:14 AM

## 2019-12-31 LAB — CULTURE, OB URINE

## 2019-12-31 NOTE — Discharge Instructions (Signed)

## 2020-01-03 ENCOUNTER — Encounter (HOSPITAL_COMMUNITY)
Admission: RE | Admit: 2020-01-03 | Discharge: 2020-01-03 | Disposition: A | Discharge status: Home / Self Care | Payer: Medicaid Other | Source: Ambulatory Visit | Attending: Family Medicine | Admitting: Family Medicine

## 2020-01-03 ENCOUNTER — Ambulatory Visit (INDEPENDENT_AMBULATORY_CARE_PROVIDER_SITE_OTHER): Payer: Medicaid Other | Admitting: Obstetrics and Gynecology

## 2020-01-03 ENCOUNTER — Other Ambulatory Visit: Payer: Self-pay

## 2020-01-03 VITALS — BP 124/65 | HR 120 | Wt 152.0 lb

## 2020-01-03 DIAGNOSIS — J45909 Unspecified asthma, uncomplicated: Secondary | ICD-10-CM

## 2020-01-03 DIAGNOSIS — O99013 Anemia complicating pregnancy, third trimester: Secondary | ICD-10-CM

## 2020-01-03 DIAGNOSIS — E669 Obesity, unspecified: Secondary | ICD-10-CM

## 2020-01-03 DIAGNOSIS — Z3A35 35 weeks gestation of pregnancy: Secondary | ICD-10-CM

## 2020-01-03 DIAGNOSIS — Z3493 Encounter for supervision of normal pregnancy, unspecified, third trimester: Secondary | ICD-10-CM

## 2020-01-03 DIAGNOSIS — M67432 Ganglion, left wrist: Secondary | ICD-10-CM

## 2020-01-03 DIAGNOSIS — D649 Anemia, unspecified: Secondary | ICD-10-CM | POA: Diagnosis not present

## 2020-01-03 DIAGNOSIS — O99343 Other mental disorders complicating pregnancy, third trimester: Secondary | ICD-10-CM

## 2020-01-03 DIAGNOSIS — Z3A00 Weeks of gestation of pregnancy not specified: Secondary | ICD-10-CM | POA: Diagnosis not present

## 2020-01-03 DIAGNOSIS — O99891 Other specified diseases and conditions complicating pregnancy: Secondary | ICD-10-CM

## 2020-01-03 DIAGNOSIS — O99513 Diseases of the respiratory system complicating pregnancy, third trimester: Secondary | ICD-10-CM

## 2020-01-03 MED ORDER — SODIUM CHLORIDE 0.9 % IV SOLN: Freq: Once | INTRAVENOUS | Status: AC

## 2020-01-03 MED ORDER — FERROUS SULFATE 75 (15 FE) MG/ML PO SOLN: 15.0000 mg | Freq: Two times a day (BID) | ORAL | 3 refills | Status: DC

## 2020-01-03 MED ORDER — SODIUM CHLORIDE 0.9 % IV SOLN
510.0000 mg | INTRAVENOUS | Status: DC
  Administered 2020-01-03: 510 mg via INTRAVENOUS
  Filled 2020-01-03: qty 17

## 2020-01-03 NOTE — Progress Notes (Signed)
   PRENATAL VISIT NOTE  Subjective:  Rose Schwartz is a 19 y.o. G1P0 at [redacted]w[redacted]d being seen today for ongoing prenatal care.  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  Patient reports no complaints.  Contractions: Irritability. Vag. Bleeding: None.  Movement: Present. Denies leaking of fluid.   The following portions of the patient's history were reviewed and updated as appropriate: allergies, current medications, past family history, past medical history, past social history, past surgical history and problem list.   Objective:   Vitals:   01/03/20 1445  BP: 124/65  Pulse: (!) 120  Weight: 152 lb (68.9 kg)    Fetal Status: Fetal Heart Rate (bpm): 169 Fundal Height: 35 cm Movement: Present     General:  Alert, oriented and cooperative. Patient is in no acute distress.  Skin: Skin is warm and dry. No rash noted.   Cardiovascular: Normal heart rate noted  Respiratory: Normal respiratory effort, no problems with respiration noted  Abdomen: Soft, gravid, appropriate for gestational age.  Pain/Pressure: Present     Pelvic: Cervical exam deferred        Extremities: Normal range of motion.  Edema: Trace Left wrist ganglion cyst: dime size, tender to touch. No erythema.   Mental Status: Normal mood and affect. Normal behavior. Normal judgment and thought content.   Assessment and Plan:  Pregnancy: G1P0 at [redacted]w[redacted]d   1. Encounter for supervision of low-risk pregnancy in third trimester  GBS and cultures next visit  Doing well Discussed birth plan. She would like a water birth. She has taken the class.    2. Anemia during pregnancy in third trimester -  Patient had a iron infusion (Fereheme) this morning and had a reaction post infusion  of low BP and dizziness post infusion. BP dropped to 88/58 - Vitamin B12 - Folate - Iron and TIBC - Ferritin - GBS/GC next visit.    3. Ganglion cyst of dorsum of left wrist  Discussed wrist brace to help stabilize cyst. Would recommend hand surgeon consult for a PP visit if continues to bother patient.   Preterm labor symptoms and general obstetric precautions including but not limited to vaginal bleeding, contractions, leaking of fluid and fetal movement were reviewed in detail with the patient. Please refer to After Visit Summary for other counseling recommendations.   Return in about 1 week (around 01/10/2020) for For cultures. CNM visit for water birth consent. .  Future Appointments  Date Time Provider Department Center  01/06/2020  3:15 PM [facility], [facility], [facility]  01/11/2020  8:00 AM Armando Reichert, CNM [facility]  01/11/2020  9:00 AM [facility] ROOM 9 [facility] None    Venia Carbon, NP

## 2020-01-03 NOTE — Progress Notes (Signed)
Patient here today for IV feraheme.  Feraheme was ran in over 30 minutes.  When it was finished and I went in to turn the pump off the patient told me she was dizzy and had a headache.  She said it started after the infusion was started but she has dizziness sometimes so she wasn't sure if this was just her or the medicine.  Vss were lower into the 90's systoli.  I laid her back in the recliner, gave her a liter of normal saline, and had her turn onto her left side.  Baby's HR was stable according to doppler.  Alferd Apa RN called the Rapid response MD today who was Dr Adrian Blackwater who stated to continue the full saline blous and no further interventions were needed.  Notified Venia Carbon, NP who patient also has an appointment with today.  Pt verbalized understanding to call us if they decide to cancel the second dose

## 2020-01-04 DIAGNOSIS — M67432 Ganglion, left wrist: Secondary | ICD-10-CM | POA: Insufficient documentation

## 2020-01-04 DIAGNOSIS — O99013 Anemia complicating pregnancy, third trimester: Secondary | ICD-10-CM | POA: Insufficient documentation

## 2020-01-04 LAB — FERRITIN: Ferritin: 15 ng/mL (ref 15–150)

## 2020-01-04 LAB — IRON AND TIBC
Iron Saturation: 67 % — ABNORMAL HIGH (ref 15–55)
Iron: 569 ug/dL (ref 27–159)
Total Iron Binding Capacity: 850 ug/dL (ref 250–450)
UIBC: 281 ug/dL (ref 131–425)

## 2020-01-04 LAB — VITAMIN B12: Vitamin B-12: 390 pg/mL (ref 232–1245)

## 2020-01-04 LAB — FOLATE: Folate: 13 ng/mL (ref >3.0)

## 2020-01-05 ENCOUNTER — Encounter (HOSPITAL_COMMUNITY): Payer: Medicaid Other

## 2020-01-06 ENCOUNTER — Ambulatory Visit (INDEPENDENT_AMBULATORY_CARE_PROVIDER_SITE_OTHER): Payer: Self-pay | Admitting: Clinical

## 2020-01-06 DIAGNOSIS — F319 Bipolar disorder, unspecified: Secondary | ICD-10-CM

## 2020-01-06 NOTE — BH Specialist Note (Signed)
Pt agrees to reschedule to virtual with [facility] on 01/24/20, as she is currently in [place], and [facility] licensure states both pt and [facility] must be physically in the state of [place] at the time of telehealth visit.

## 2020-01-11 ENCOUNTER — Ambulatory Visit (INDEPENDENT_AMBULATORY_CARE_PROVIDER_SITE_OTHER): Payer: Medicaid Other | Admitting: Advanced Practice Midwife

## 2020-01-11 ENCOUNTER — Other Ambulatory Visit: Payer: Self-pay

## 2020-01-11 ENCOUNTER — Encounter: Payer: Medicaid Other | Admitting: Advanced Practice Midwife

## 2020-01-11 ENCOUNTER — Other Ambulatory Visit (HOSPITAL_COMMUNITY)
Admission: RE | Admit: 2020-01-11 | Discharge: 2020-01-11 | Disposition: A | Discharge status: Home / Self Care | Payer: Medicaid Other | Source: Ambulatory Visit | Attending: Advanced Practice Midwife | Admitting: Advanced Practice Midwife

## 2020-01-11 ENCOUNTER — Inpatient Hospital Stay (HOSPITAL_COMMUNITY): Admission: RE | Admit: 2020-01-11 | Payer: Medicaid Other | Source: Ambulatory Visit

## 2020-01-11 DIAGNOSIS — Z3493 Encounter for supervision of normal pregnancy, unspecified, third trimester: Secondary | ICD-10-CM

## 2020-01-11 DIAGNOSIS — Z3403 Encounter for supervision of normal first pregnancy, third trimester: Secondary | ICD-10-CM

## 2020-01-11 DIAGNOSIS — Z3A36 36 weeks gestation of pregnancy: Secondary | ICD-10-CM

## 2020-01-11 LAB — CBC
Hematocrit: 32.2 % — ABNORMAL LOW (ref 34.0–46.6)
Hemoglobin: 10 g/dL — ABNORMAL LOW (ref 11.1–15.9)
MCH: 24.8 pg — ABNORMAL LOW (ref 26.6–33.0)
MCHC: 31.1 g/dL — ABNORMAL LOW (ref 31.5–35.7)
MCV: 80 fL (ref 79–97)
Platelets: 198 10*3/uL (ref 150–450)
RBC: 4.03 x10E6/uL (ref 3.77–5.28)
RDW: 17.5 % — ABNORMAL HIGH (ref 11.7–15.4)
WBC: 12.6 10*3/uL — ABNORMAL HIGH (ref 3.4–10.8)

## 2020-01-11 NOTE — Progress Notes (Signed)
° °  PRENATAL VISIT NOTE  Subjective:  Rose Schwartz is a 19 y.o. G1P0 at [redacted]w[redacted]d being seen today for ongoing prenatal care.  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  Patient reports no complaints.  Contractions: Not present. Vag. Bleeding: None.  Movement: Present. Denies leaking of fluid.   The following portions of the patient's history were reviewed and updated as appropriate: allergies, current medications, past family history, past medical history, past social history, past surgical history and problem list.   Objective:   Vitals:   01/11/20 1102  BP: 119/77  Pulse: 98    Fetal Status:     Movement: Present     General:  Alert, oriented and cooperative. Patient is in no acute distress.  Skin: Skin is warm and dry. No rash noted.   Cardiovascular: Normal heart rate noted  Respiratory: Normal respiratory effort, no problems with respiration noted  Abdomen: Soft, gravid, appropriate for gestational age.  Pain/Pressure: Present     Pelvic: Cervical exam performed in the presence of a chaperone        Extremities: Normal range of motion.  Edema: Mild pitting, slight indentation  Mental Status: Normal mood and affect. Normal behavior. Normal judgment and thought content.   Assessment and Plan:  Pregnancy: G1P0 at [redacted]w[redacted]d 1. Encounter for supervision of low-risk pregnancy in third trimester - CBC: pt had rxn to fereheme and had an appt for today that she cancelled.  - GBS - GC/CT  - Interested in water birth, class certificate provided today, consent signed   Term labor symptoms and general obstetric precautions including but not limited to vaginal  bleeding, contractions, leaking of fluid and fetal movement were reviewed in detail with the patient. Please refer to After Visit Summary for other counseling recommendations.   No follow-ups on file.  No future appointments.  Thressa Sheller DNP, CNM  01/11/20  11:20 AM

## 2020-01-12 ENCOUNTER — Encounter: Payer: Self-pay | Admitting: General Practice

## 2020-01-12 LAB — GC/CHLAMYDIA PROBE AMP (~~LOC~~) NOT AT ARMC
Chlamydia: NEGATIVE
Comment: NEGATIVE
Comment: NORMAL
Neisseria Gonorrhea: NEGATIVE

## 2020-01-15 LAB — CULTURE, BETA STREP (GROUP B ONLY): Strep Gp B Culture: NEGATIVE

## 2020-01-17 ENCOUNTER — Other Ambulatory Visit: Payer: Self-pay

## 2020-01-17 ENCOUNTER — Inpatient Hospital Stay (HOSPITAL_COMMUNITY)
Admission: AD | Admit: 2020-01-17 | Discharge: 2020-01-19 | DRG: 807 | Disposition: A | Discharge status: Home / Self Care | Payer: Medicaid Other | Attending: Obstetrics & Gynecology | Admitting: Obstetrics & Gynecology

## 2020-01-17 ENCOUNTER — Encounter (HOSPITAL_COMMUNITY): Payer: Self-pay | Admitting: Obstetrics and Gynecology

## 2020-01-17 DIAGNOSIS — Z3A37 37 weeks gestation of pregnancy: Secondary | ICD-10-CM

## 2020-01-17 DIAGNOSIS — F319 Bipolar disorder, unspecified: Secondary | ICD-10-CM | POA: Diagnosis present

## 2020-01-17 DIAGNOSIS — O4292 Full-term premature rupture of membranes, unspecified as to length of time between rupture and onset of labor: Secondary | ICD-10-CM | POA: Diagnosis not present

## 2020-01-17 DIAGNOSIS — O4290 Premature rupture of membranes, unspecified as to length of time between rupture and onset of labor, unspecified weeks of gestation: Secondary | ICD-10-CM | POA: Diagnosis not present

## 2020-01-17 DIAGNOSIS — O26893 Other specified pregnancy related conditions, third trimester: Secondary | ICD-10-CM | POA: Diagnosis not present

## 2020-01-17 DIAGNOSIS — Z20822 Contact with and (suspected) exposure to covid-19: Secondary | ICD-10-CM | POA: Diagnosis present

## 2020-01-17 DIAGNOSIS — O99344 Other mental disorders complicating childbirth: Secondary | ICD-10-CM | POA: Diagnosis not present

## 2020-01-17 DIAGNOSIS — Z23 Encounter for immunization: Secondary | ICD-10-CM | POA: Diagnosis not present

## 2020-01-17 DIAGNOSIS — O9902 Anemia complicating childbirth: Secondary | ICD-10-CM | POA: Diagnosis present

## 2020-01-17 DIAGNOSIS — J45909 Unspecified asthma, uncomplicated: Secondary | ICD-10-CM | POA: Diagnosis present

## 2020-01-17 DIAGNOSIS — O99013 Anemia complicating pregnancy, third trimester: Secondary | ICD-10-CM | POA: Diagnosis present

## 2020-01-17 DIAGNOSIS — O9952 Diseases of the respiratory system complicating childbirth: Secondary | ICD-10-CM | POA: Diagnosis not present

## 2020-01-17 DIAGNOSIS — O4202 Full-term premature rupture of membranes, onset of labor within 24 hours of rupture: Secondary | ICD-10-CM | POA: Diagnosis not present

## 2020-01-17 DIAGNOSIS — Z3490 Encounter for supervision of normal pregnancy, unspecified, unspecified trimester: Secondary | ICD-10-CM

## 2020-01-17 LAB — CBC
HCT: 36.7 % (ref 36.0–46.0)
Hemoglobin: 11.1 g/dL — ABNORMAL LOW (ref 12.0–15.0)
MCH: 25.1 pg — ABNORMAL LOW (ref 26.0–34.0)
MCHC: 30.2 g/dL (ref 30.0–36.0)
MCV: 82.8 fL (ref 80.0–100.0)
Platelets: 219 10*3/uL (ref 150–400)
RBC: 4.43 MIL/uL (ref 3.87–5.11)
RDW: 20.7 % — ABNORMAL HIGH (ref 11.5–15.5)
WBC: 8.9 10*3/uL (ref 4.0–10.5)
nRBC: 0.2 % (ref 0.0–0.2)

## 2020-01-17 LAB — TYPE AND SCREEN
ABO/RH(D): O POS
Antibody Screen: NEGATIVE

## 2020-01-17 LAB — SARS CORONAVIRUS 2 BY RT PCR (HOSPITAL ORDER, PERFORMED IN ~~LOC~~ HOSPITAL LAB): SARS Coronavirus 2: NEGATIVE

## 2020-01-17 LAB — RPR: RPR Ser Ql: NONREACTIVE

## 2020-01-17 LAB — POCT FERN TEST: POCT Fern Test: POSITIVE

## 2020-01-17 MED ORDER — ACETAMINOPHEN 325 MG PO TABS: 650.0000 mg | ORAL_TABLET | ORAL | Status: DC | PRN

## 2020-01-17 MED ORDER — ONDANSETRON HCL 4 MG/2ML IJ SOLN
4.0000 mg | Freq: Four times a day (QID) | INTRAMUSCULAR | Status: DC | PRN
  Administered 2020-01-17 (×2): 4 mg via INTRAVENOUS
  Filled 2020-01-17 (×2): qty 2

## 2020-01-17 MED ORDER — PHENYLEPHRINE 40 MCG/ML (10ML) SYRINGE FOR IV PUSH (FOR BLOOD PRESSURE SUPPORT): 80.0000 ug | PREFILLED_SYRINGE | INTRAVENOUS | Status: DC | PRN

## 2020-01-17 MED ORDER — EPHEDRINE 5 MG/ML INJ: 10.0000 mg | INTRAVENOUS | Status: DC | PRN

## 2020-01-17 MED ORDER — FENTANYL CITRATE (PF) 100 MCG/2ML IJ SOLN
50.0000 ug | INTRAMUSCULAR | Status: DC | PRN
  Administered 2020-01-17 (×2): 100 ug via INTRAVENOUS
  Filled 2020-01-17 (×2): qty 2

## 2020-01-17 MED ORDER — ACETAMINOPHEN 325 MG PO TABS
650.0000 mg | ORAL_TABLET | ORAL | Status: DC | PRN
Start: 2020-01-17 — End: 2020-01-17

## 2020-01-17 MED ORDER — FENTANYL-BUPIVACAINE-NACL 0.5-0.125-0.9 MG/250ML-% EP SOLN: 12.0000 mL/h | EPIDURAL | Status: DC | PRN

## 2020-01-17 MED ORDER — LACTATED RINGERS IV SOLN: 500.0000 mL | Freq: Once | INTRAVENOUS | Status: DC

## 2020-01-17 MED ORDER — ONDANSETRON HCL 4 MG/2ML IJ SOLN: 4.0000 mg | Freq: Once | INTRAMUSCULAR | Status: DC

## 2020-01-17 MED ORDER — LACTATED RINGERS IV SOLN: INTRAVENOUS | Status: DC

## 2020-01-17 MED ORDER — LIDOCAINE HCL (PF) 1 % IJ SOLN: 30.0000 mL | INTRAMUSCULAR | Status: DC | PRN

## 2020-01-17 MED ORDER — SOD CITRATE-CITRIC ACID 500-334 MG/5ML PO SOLN: 30.0000 mL | ORAL | Status: DC | PRN

## 2020-01-17 MED ORDER — OXYCODONE-ACETAMINOPHEN 5-325 MG PO TABS: 1.0000 | ORAL_TABLET | ORAL | Status: DC | PRN

## 2020-01-17 MED ORDER — MISOPROSTOL 100 MCG PO TABS
25.0000 ug | ORAL_TABLET | ORAL | Status: DC
  Administered 2020-01-17: 25 ug via VAGINAL
  Filled 2020-01-17: qty 1

## 2020-01-17 MED ORDER — DIPHENHYDRAMINE HCL 50 MG/ML IJ SOLN: 12.5000 mg | INTRAMUSCULAR | Status: DC | PRN

## 2020-01-17 MED ORDER — LACTATED RINGERS IV SOLN: 500.0000 mL | INTRAVENOUS | Status: DC | PRN

## 2020-01-17 MED ORDER — OXYCODONE-ACETAMINOPHEN 5-325 MG PO TABS
2.0000 | ORAL_TABLET | ORAL | Status: DC | PRN
  Administered 2020-01-18: 2 via ORAL
  Filled 2020-01-17: qty 2

## 2020-01-17 MED ORDER — OXYTOCIN BOLUS FROM INFUSION
333.0000 mL | Freq: Once | INTRAVENOUS | Status: AC
  Administered 2020-01-17: 333 mL via INTRAVENOUS

## 2020-01-17 MED ORDER — OXYTOCIN BOLUS FROM INFUSION: 333.0000 mL | Freq: Once | INTRAVENOUS | Status: DC

## 2020-01-17 MED ORDER — OXYTOCIN-SODIUM CHLORIDE 30-0.9 UT/500ML-% IV SOLN
2.5000 [IU]/h | INTRAVENOUS | Status: DC
  Filled 2020-01-17: qty 500

## 2020-01-17 MED ORDER — ONDANSETRON HCL 4 MG/2ML IJ SOLN: 4.0000 mg | Freq: Four times a day (QID) | INTRAMUSCULAR | Status: DC | PRN

## 2020-01-17 MED ORDER — OXYTOCIN-SODIUM CHLORIDE 30-0.9 UT/500ML-% IV SOLN: 2.5000 [IU]/h | INTRAVENOUS | Status: DC

## 2020-01-17 NOTE — H&P (Signed)
LABOR AND DELIVERY ADMISSION HISTORY AND PHYSICAL NOTE  Rose Schwartz is a 19 y.o. female G1P0 with IUP at 78w2dby 5wk [place]resenting for SROM.   Reports large gush of clear fluid around 0720 this AM She reports positive fetal movement. She denies vaginal bleeding, irregular contractions.   She plans on breast feeding. Her contraception plan is: condoms  Prenatal History/Complications[place][redacted]w[redacted]d' , CWD, normal anatomy, cephalic presentation, anterior placenta, 90%ile, EFW 3191 Pregnancy complications:  - asthma - hx of bipolar 1  Past Medical History: Past Medical History:  Diagnosis Date  . Anemia   . Asthma   . Bipolar 1 disorder ([place]   . UTI (urinary tract infection)     Past Surgical History: Past Surgical History:  Procedure Laterality Date  . FETAL AMNIOTIC BAND RELEASE      Obstetrical History: OB History    Gravida  1   Para      Term      Preterm      AB      Living        SAB      TAB      Ectopic      Multiple      Live Births              Social History: Social History   Socioeconomic History  . Marital status: Single    Spouse name: Not on file  . Number of children: Not on file  . Years of education: Not on file  . Highest education level: Not on file  Occupational History  . Not on file  Tobacco Use  . Smoking status: Never Smoker  . Smokeless tobacco: Never Used  Vaping Use  . Vaping Use: Never used  Substance and Sexual Activity  . Alcohol use: Not Currently    Comment: SOCIAL  . Drug use: Not Currently    Types: Marijuana    Comment: few years ago  . Sexual activity: Yes    Birth control/protection: None  Other Topics Concern  . Not on file  Social History Narrative  . Not on file   Social Determinants of Health   Financial Resource Strain:   . Difficulty of Paying Living Expenses:   Food Insecurity: Food Insecurity Present  . Worried About RCharity fundraiserin the Last Year:  Sometimes true  . Ran Out of Food in the Last Year: Never true  Transportation Needs: No Transportation Needs  . Lack of Transportation (Medical): No  . Lack of Transportation (Non-Medical): No  Physical Activity:   . Days of Exercise per Week:   . Minutes of Exercise per Session:   Stress:   . Feeling of Stress :   Social Connections:   . Frequency of Communication with Friends and Family:   . Frequency of Social Gatherings with Friends and Family:   . Attends Religious Services:   . Active Member of Clubs or Organizations:   . Attends CArchivistMeetings:   .Marland KitchenMarital Status:     Family History: Family History  Problem Relation Age of Onset  . Fibromyalgia Mother     Allergies: Allergies  Allergen Reactions  . Other Anaphylaxis and Shortness Of Breath    Tree nuts    Medications Prior to Admission  Medication Sig Dispense Refill Last Dose  . albuterol (VENTOLIN HFA) 108 (90 Base) MCG/ACT inhaler Inhale 2 puffs into the lungs every 4 (  four) hours as needed for wheezing or shortness of breath (cough, shortness of breath or wheezing.). 1 Inhaler 1 Past Month at Unknown time  . ferrous sulfate (FE-VITE IRON) 75 (15 Fe) MG/ML SOLN Take 1 mL (15 mg of iron total) by mouth 2 (two) times daily. 100 mL 3 01/17/2020 at Unknown time  . Prenatal Vit-Fe Fumarate-FA (PREPLUS) 27-1 MG TABS Take 1 tablet by mouth daily. 30 tablet 6 01/17/2020 at Unknown time  . Blood Pressure Monitoring (BLOOD PRESSURE KIT) DEVI 1 Device by Does not apply route as needed. 1 each 0   . cephALEXin (KEFLEX) 500 MG capsule Take 1 capsule (500 mg total) by mouth 4 (four) times daily. (Patient not taking: Reported on 01/03/2020) 28 capsule 2   . cetirizine (ZYRTEC ALLERGY) 10 MG tablet Take 1 tablet (10 mg total) by mouth daily. (Patient not taking: Reported on 01/03/2020) 60 tablet 1   . EPINEPHrine 0.3 mg/0.3 mL IJ SOAJ injection AS DIRECTED AS NEEDED FOR SYSTEMIC REACTIONS INJECTION        Review of  Systems  All systems reviewed and negative except as stated in HPI  Physical Exam Blood pressure 120/76, pulse 88, temperature 98.6 F (37 C), resp. rate 18, last menstrual period 04/22/2019. General appearance: alert, oriented, NAD Lungs: normal respiratory effort Heart: regular rate Abdomen: soft, non-tender; gravid, leopolds  Extremities: No calf swelling or tenderness Presentation: cephalic by RN SVE  Fetal monitoringBaseline: 145 bpm, Variability: Good {> 6 bpm), Accelerations: Reactive and Decelerations: Early Uterine activity: irregular  Dilation: 1.5 Effacement (%): 80 Station: -3 Exam by:: Rose Guild NP  Prenatal labs: ABO, Rh: O/Positive/-- (01/07 0952) Antibody: Negative (01/07 0952) Rubella: 1.00 (01/07 0952) RPR: Non Reactive (05/13 0941)  HBsAg: Negative (01/07 0952)  HIV: Non Reactive (05/13 0941)  GC/Chlamydia: neg/neg 01/11/2020  GBS: Negative/-- (07/06 1216)  2-hr GTT: normal 11/18/2019 (37,54,36) Genetic screening:  Low risk Panorama Anatomy US: normal  Prenatal Transfer Tool  Maternal Diabetes: No Genetic Screening: Normal Maternal Ultrasounds/Referrals: Normal Fetal Ultrasounds or other Referrals:  None Maternal Substance Abuse:  No Significant Maternal Medications:  None Significant Maternal Lab Results: Group B Strep negative  Results for orders placed or performed during the hospital encounter of 01/17/20 (from the past 24 hour(s))  POCT fern test   Collection Time: 01/17/20  8:20 AM  Result Value Ref Range   POCT Fern Test Positive = ruptured amniotic [place]     Patient Active Problem List   Diagnosis Date Noted  . Ganglion cyst of dorsum of left wrist 01/04/2020  . Anemia during pregnancy in third trimester 01/04/2020  . Hydronephrosis, bilateral 11/19/2019  . Alpha thalassemia silent carrier 08/04/2019  . 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 Supervision of low-risk pregnancy 07/14/2019  . Asthma   .  Bipolar 1 disorder ([place])     Assessment: Rose Schwartz is a 19 y.o. G1P0 at 64w2dhere for PROM.   #Labor: SOL. Cervical change in MAU #Pain: Desires waterbirth #FWB: Cat I tracing #GBS/ID: Neg #COVID: Neg #MOF: Breast #MOC: Condoms #Circ: Inpatient  --Patient desires waterbirth. Confirmed course certificate, consent and midwife visit accomplished --Discussed position changes q 45 minutes. Goal to get into tub during active labor ~ 6-7cm --Best friend Rose Fisherand FOB Rose Beardat bedside and supportive --Anticipate NSVD  Rose Snooks MSN, CNM Certified Nurse Midwife, [facility]for [employer] [facility] 01/17/20 2:06 PM

## 2020-01-17 NOTE — Progress Notes (Signed)
Rose Schwartz is a 19 y.o. G1P0 at [redacted]w[redacted]d admitted for SROM 0720.  Subjective: Pt states ctx hurt "real bad"  Objective: BP 124/80   Pulse 90   Temp 98.2 F (36.8 C) (Oral)   Resp 18   LMP 04/22/2019  Pt R lateral in bed, husband rubbing her back w/ ctx  FHT:  FHR: 150 bpm, variability: moderate,  accelerations:  Present,  decelerations:  Absent UC:   irregular, every 2-8 minutes SVE:   Dilation: 2.5 Effacement (%): 80 Station: -3 Exam by:: Arvil Chaco, SNM  Labs: Lab Results  Component Value Date   WBC 8.9 01/17/2020   HGB 11.1 (L) 01/17/2020   HCT 36.7 01/17/2020   MCV 82.8 01/17/2020   PLT 219 01/17/2020    Assessment / Plan: Early latent labor, minimal cervical change after 8 hours Plan: Augmentation of labor w/ vaginal cytotec  Labor: early latent phase of labor Preeclampsia:  NA Fetal Wellbeing:  Category I Pain Control:  Labor support without medications I/D:  GBS neg Anticipated MOD:  NSVD  Rose Schwartz 01/17/2020, 4:28 PM

## 2020-01-17 NOTE — H&P (Deleted)
Rose Schwartz is a 19 y.o. female G1P0 @ [redacted]w[redacted]d presenting for SROM @0720 .  OB History    Gravida  1   Para      Term      Preterm      AB      Living        SAB      TAB      Ectopic      Multiple      Live Births             Past Medical History:  Diagnosis Date  . Anemia   . Asthma   . Bipolar 1 disorder ([facility])   . UTI (urinary tract infection)    Past Surgical History:  Procedure Laterality Date  . FETAL AMNIOTIC BAND RELEASE     Family History: family history includes Fibromyalgia in her mother. Social History:  reports that she has never smoked. She has never used smokeless tobacco. She reports previous alcohol use. She reports previous drug use. Drug: Marijuana.     Maternal Diabetes: No Genetic Screening: Normal Maternal Ultrasounds/Referrals: Normal: dating & anatomy Fetal Ultrasounds or other Referrals:  Other: bilateral hydronephrosis Maternal Substance Abuse:  No: see above Significant Maternal Medications:  None Significant Maternal Lab Results:  Group B Strep negative Other Comments:  None   Nursing Staff Provider  Office Location  [facility] Dating  First trimester  Language   English Anatomy US  normal  Flu Vaccine   Declined  Genetic Screen  NIPS: Normal female   AFP:     TDaP vaccine   11/16/2019 Hgb A1C or  GTT Early  Third trimester 79/96/93  Rhogam   O positive    LAB RESULTS   Feeding Plan  Breast  Blood Type O/Positive/-- (01/07 0952)   Contraception Undecided- encouraged LARC Antibody Negative (01/07 0952)  Circumcision Yes, info given  Rubella 1.00 (01/07 0952)  Pediatrician   RPR Non Reactive (01/07 0952)   Support Person 06-25-1974 (FOB) HBsAg Negative (01/07 06[birth year]-1972)   Prenatal Classes  HIV Non Reactive (01/07 0952)  BTL Consent NA GBS  (For PCN allergy, check sensitivities)   VBAC Consent NA  Pap ppartum    Hgb Electro  Alpha thalassemia carrier.   BP Cuff Has BP cuff CF Neg.     SMA Neg    Waterbirth  [x]   Class [x]  Consent [x]  CNM visit     Review of Systems  Respiratory: Negative for cough, shortness of breath and wheezing.   Gastrointestinal: Positive for abdominal pain.  All other systems reviewed and are negative.    Maternal Medical History:  Reason for admission: Rupture of membranes.   Contractions: Onset was 3-5 hours ago.   Perceived severity is mild.    Fetal activity: Perceived fetal activity is normal.    Prenatal complications: no prenatal complications Prenatal Complications - Diabetes: none.    Dilation: 1.5 Effacement (%): 80 Station: -3 Exam by:: NP Blood pressure 120/76, pulse 88, temperature 98.6 F (37 C), resp. rate 18, last menstrual period 04/22/2019.   Maternal Exam:  Uterine Assessment: Contraction strength is mild.  Contraction frequency is irregular.   Introitus: Amniotic fluid character: clear.  Cervix: not evaluated.   Fetal Exam Fetal Monitor Review: Mode: ultrasound.   Baseline rate: 140.  Variability: moderate (6-25 bpm).   Pattern: accelerations present and no decelerations.    Fetal State Assessment: Category I - tracings are normal.  Physical Exam Constitutional:      Appearance: Normal appearance.  Cardiovascular:     Rate and Rhythm: Normal rate and regular rhythm.     Pulses: Normal pulses.     Heart sounds: Normal heart sounds.  Pulmonary:     Effort: Pulmonary effort is normal.     Breath sounds: Normal breath sounds. No wheezing.  Skin:    General: Skin is warm and dry.  Neurological:     Mental Status: She is alert and oriented to person, place, and time.  Psychiatric:        Mood and Affect: Mood normal.        Behavior: Behavior normal.        Thought Content: Thought content normal.        Judgment: Judgment normal.     Prenatal labs: ABO, Rh: --/--/PENDING (07/12 4665) Antibody: PENDING (07/12 0834) Rubella: 1.00 (01/07 0952) RPR: Non Reactive (05/13 0941)  HBsAg: Negative  (01/07 0952)  HIV: Non Reactive (05/13 0941)  GBS: Negative/-- (07/06 1216)   Assessment/Plan: SROM, early labor Expectant mgmt w/ water birth   Lelon Mast 01/17/2020, 9:23 AM

## 2020-01-17 NOTE — Progress Notes (Signed)
Rose Schwartz is a 19 y.o. G1P0 at [redacted]w[redacted]d   Subjective: Pt reports moderate ctx   Objective: BP 124/80   Pulse 90   Temp 98.2 F (36.8 C) (Oral)   Resp 18   LMP 04/22/2019  No intake/output data recorded. No intake/output data recorded.  FHT:  FHR: 135 bpm, variability: moderate,  accelerations:  Present,  decelerations:  Absent prior to using birthing ball @ 1348 UC:   irregular, every 5-11 minutes SVE:   Dilation: 1.5 Effacement (%): 80 Station: -3 Exam by:: Judeth Horn NP  Labs: Lab Results  Component Value Date   WBC 8.9 01/17/2020   HGB 11.1 (L) 01/17/2020   HCT 36.7 01/17/2020   MCV 82.8 01/17/2020   PLT 219 01/17/2020    Assessment / Plan: Discussed changing position q 45 minutes; pt up on birthing ball  Labor: Pt up on birthing ball w/ friend Dow Adolph as support person @ side Preeclampsia:  NA Fetal Wellbeing:  Category I Pain Control:  Labor support without medications I/D:  GBS neg Anticipated MOD:  NSVD  Lelon Mast 01/17/2020, 2:34 PM

## 2020-01-17 NOTE — Progress Notes (Signed)
Patient ID: Rose Schwartz, female   DOB: Jul 23, 2000, 19 y.o.   MRN: [id number]  Feeling some pressure, but not a strong urge to push  BP 132/77, P 108 FHR 130s, +accels, early variables Ctx q 1-57mins spont Cx 9/C/vtx 0 to +1  IUP@37 .2wks Transition  Preparing tub for waterbirth Anticipate vag del  [name] [facility] 01/17/2020 9:28 PM

## 2020-01-17 NOTE — Progress Notes (Signed)
Patient ID: Rose Schwartz, female   DOB: 09/09/2000, 19 y.o.   MRN: [id number]  In to introduce self to pt; she has just received a dose of Fentanyl and is resting/breathing w/ ctx  BP 115/70, P 97 FHR 135-145, +accels, no decels Ctx q 2 mins Cx was 4-5/C/vtx -1 per RN exam at 1830  IUP@37 .2wks SROM x 12h Early active labor  Continue labor support Make plans for waterbirth as pt desires Anticipate vag del  [name] [facility] 01/17/2020 7:32 PM

## 2020-01-17 NOTE — MAU Note (Signed)
.   Rose Schwartz is a 19 y.o. at [redacted]w[redacted]d here in MAU reporting:large gush of fluid that continues to leak out at 720 this morning..Denies any VB, reports irregular contractions  Onset of complaint: 0720 Pain score: 5 Vitals:   01/17/20 0821  BP: 120/76  Pulse: 88  Resp: 18  Temp: 98.6 F (37 C)     FHT:140 Lab orders placed from triage:

## 2020-01-17 NOTE — Discharge Summary (Addendum)
Postpartum Discharge Summary      Patient Name: Rose Schwartz DOB: 01/11/2001 MRN: [id number]  Date of admission: 01/17/2020 Delivery date:01/17/2020  Delivering provider: Serita Grammes D  Date of discharge: 01/19/2020  Admitting diagnosis: PROM (premature rupture of membranes) [O42.90] Labor and delivery indication for care or intervention [O75.9] Intrauterine pregnancy: [redacted]w[redacted]d    Secondary diagnosis:  Active Problems:   Supervision of low-risk pregnancy   Asthma   Bipolar 1 disorder ([place]   Anemia during pregnancy in third trimester   PROM (premature rupture of membranes)   Labor and delivery indication for care or intervention  Additional problems: none    Discharge diagnosis: Term Pregnancy Delivered                                              Post partum procedures: none Augmentation: Cytotec Complications: None  Hospital course: Induction of Labor With Vaginal Delivery (waterbirth)  19y.o. yo G1P0 at 327w2das admitted to the hospital 01/17/2020 for induction of labor.  Indication for induction: PROM.  Patient had an uncomplicated labor course, including receiving a single cytotec dose at 2cm followed by latent then active labor. She got in the tub at 9cm and then progressed to vaCouncil Hill[place]rox an hour later. Membrane Rupture Time/Date: 7:20 AM ,01/17/2020   Delivery Method:Vaginal, Spontaneous  Episiotomy: None  Lacerations:  None  Details of delivery can be found in separate delivery note.  Patient had a routine postpartum course. Patient is discharged home 01/19/20.  Newborn Data: Birth date:01/17/2020  Birth time:10:38 PM  Gender:Female  Living status:Living  Apgars:7 ,9  Weight:3340 g  3340gm (7lb 5.8oz)  Magnesium Sulfate received: No BMZ received: No Rhophylac:N/A MMR:N/A T-DaP:Given prenatally Flu: No Transfusion:No  Physical exam  Vitals:   01/18/20 0800 01/18/20 1449 01/18/20 2022 01/19/20 0558  BP: 110/66 107/75 104/68 104/68  Pulse: 80  80 98 86  Resp: '20 20 18 16  ' Temp: 98.2 F (36.8 C) 98.6 F (37 C) 98.9 F (37.2 C) 98.2 F (36.8 C)  TempSrc: Oral Oral Oral Oral  SpO2: 99%  100% 100%   General: alert, cooperative and no distress Lochia: appropriate Uterine Fundus: firm Incision: N/A DVT Evaluation: No evidence of DVT seen on physical exam. Labs: Lab Results  Component Value Date   WBC 8.9 01/17/2020   HGB 11.1 (L) 01/17/2020   HCT 36.7 01/17/2020   MCV 82.8 01/17/2020   PLT 219 01/17/2020   CMP Latest Ref Rng & Units 12/03/2019  Glucose 70 - 99 mg/dL 97  BUN 6 - 20 mg/dL 5(L)  Creatinine 0.44 - 1.00 mg/dL 0.55  Sodium 135 - 145 mmol/L 136  Potassium 3.5 - 5.1 mmol/L 4.2  Chloride 98 - 111 mmol/L 107  CO2 22 - 32 mmol/L 20(L)  Calcium 8.9 - 10.3 mg/dL 8.7(L)  Total Protein 6.5 - 8.1 g/dL -  Total Bilirubin 0.3 - 1.2 mg/dL -  Alkaline Phos 38 - 126 U/L -  AST 15 - 41 U/L -  ALT 0 - 44 U/L -   Edinburgh Score: Edinburgh Postnatal Depression Scale Screening Tool 01/18/2020  I have been able to laugh and see the funny side of things. (No Data)     After visit meds:  Allergies as of 01/19/2020       Reactions   Other Anaphylaxis, Shortness  Of Breath   Tree nuts        Medication List     STOP taking these medications    cephALEXin 500 MG capsule Commonly known as: KEFLEX   cetirizine 10 MG tablet Commonly known as: ZyrTEC Allergy       TAKE these medications    acetaminophen 325 MG tablet Commonly known as: Tylenol Take 2 tablets (650 mg total) by mouth every 4 (four) hours as needed (for pain scale < 4).   albuterol 108 (90 Base) MCG/ACT inhaler Commonly known as: VENTOLIN HFA Inhale 2 puffs into the lungs every 4 (four) hours as needed for wheezing or shortness of breath (cough, shortness of breath or wheezing.).   Blood Pressure Kit Devi 1 Device by Does not apply route as needed.   calcium carbonate 500 MG chewable tablet Commonly known as: TUMS - dosed in mg elemental  calcium Chew 2 tablets by mouth 3 (three) times daily as needed for indigestion or heartburn.   EPINEPHrine 0.3 mg/0.3 mL Soaj injection Commonly known as: EPI-PEN Inject 0.3 mg into the muscle as needed for anaphylaxis.   ferrous sulfate 75 (15 Fe) MG/ML Soln Commonly known as: Fe-Vite Iron Take 1 mL (15 mg of iron total) by mouth 2 (two) times daily.   ibuprofen 600 MG tablet Commonly known as: ADVIL Take 1 tablet (600 mg total) by mouth every 6 (six) hours.   PrePLUS 27-1 MG Tabs Take 1 tablet by mouth daily.         Discharge home in stable condition Infant Feeding: Breast Infant Disposition: disposition per peds Discharge instruction: per After Visit Summary and Postpartum booklet. Activity: Advance as tolerated. Pelvic rest for 6 weeks.  Diet: routine diet Future Appointments: Future Appointments  Date Time Provider [place]  02/01/2020  1:15 PM [facility]  02/16/2020 10:55 AM Danielle Rankin [facility]  02/17/2020  1:30 PM Pace, Steffanie Dunn, MD [facility] None   Follow up Visit:   Myrtis Ser, CNM  P [facility] Admin Pool Please schedule this patient for Postpartum visit in: 4 weeks with the following provider: Any provider  In-Person  For C/S patients schedule nurse incision check in weeks 2 weeks: no  Low risk pregnancy complicated by: none  Delivery mode:  SVD  Anticipated Birth Control:  IUD  PP Procedures needed: none  Schedule Integrated [place] visit: 1-2wks, yes (bipolar- has been seeing Roselyn Reef)  01/19/2020 Rise Patience, DO PGY 1 Family Medicine  GME ATTESTATION:  I saw and evaluated the patient. I agree with the findings and the plan of care as documented in the resident's note.  Merilyn Baba, DO OB Fellow, [place] for [place] 01/19/2020 6:53 AM

## 2020-01-18 ENCOUNTER — Encounter (HOSPITAL_COMMUNITY): Payer: Self-pay | Admitting: Family Medicine

## 2020-01-18 MED ORDER — OXYCODONE HCL 5 MG PO TABS
5.0000 mg | ORAL_TABLET | ORAL | Status: DC | PRN
  Filled 2020-01-18: qty 1

## 2020-01-18 MED ORDER — ONDANSETRON HCL 4 MG/2ML IJ SOLN: 4.0000 mg | INTRAMUSCULAR | Status: DC | PRN

## 2020-01-18 MED ORDER — DIBUCAINE (PERIANAL) 1 % EX OINT: 1.0000 "application " | TOPICAL_OINTMENT | CUTANEOUS | Status: DC | PRN

## 2020-01-18 MED ORDER — SENNOSIDES-DOCUSATE SODIUM 8.6-50 MG PO TABS
2.0000 | ORAL_TABLET | ORAL | Status: DC
  Administered 2020-01-18 – 2020-01-19 (×2): 2 via ORAL
  Filled 2020-01-18 (×2): qty 2

## 2020-01-18 MED ORDER — BENZOCAINE-MENTHOL 20-0.5 % EX AERO
1.0000 "application " | INHALATION_SPRAY | CUTANEOUS | Status: DC | PRN
  Administered 2020-01-18: 1 via TOPICAL
  Filled 2020-01-18: qty 56

## 2020-01-18 MED ORDER — PNEUMOCOCCAL VAC POLYVALENT 25 MCG/0.5ML IJ INJ
0.5000 mL | INJECTION | INTRAMUSCULAR | Status: AC
  Administered 2020-01-19: 0.5 mL via INTRAMUSCULAR
  Filled 2020-01-18: qty 0.5

## 2020-01-18 MED ORDER — ONDANSETRON HCL 4 MG PO TABS: 4.0000 mg | ORAL_TABLET | ORAL | Status: DC | PRN

## 2020-01-18 MED ORDER — WITCH HAZEL-GLYCERIN EX PADS: 1.0000 "application " | MEDICATED_PAD | CUTANEOUS | Status: DC | PRN

## 2020-01-18 MED ORDER — IBUPROFEN 600 MG PO TABS
600.0000 mg | ORAL_TABLET | Freq: Four times a day (QID) | ORAL | Status: DC
  Administered 2020-01-18 – 2020-01-19 (×6): 600 mg via ORAL
  Filled 2020-01-18 (×6): qty 1

## 2020-01-18 MED ORDER — TETANUS-DIPHTH-ACELL PERTUSSIS 5-2.5-18.5 LF-MCG/0.5 IM SUSP: 0.5000 mL | Freq: Once | INTRAMUSCULAR | Status: DC

## 2020-01-18 MED ORDER — DIPHENHYDRAMINE HCL 25 MG PO CAPS: 25.0000 mg | ORAL_CAPSULE | Freq: Four times a day (QID) | ORAL | Status: DC | PRN

## 2020-01-18 MED ORDER — PRENATAL MULTIVITAMIN CH
1.0000 | ORAL_TABLET | Freq: Every day | ORAL | Status: DC
  Administered 2020-01-18 – 2020-01-19 (×2): 1 via ORAL
  Filled 2020-01-18 (×2): qty 1

## 2020-01-18 MED ORDER — ACETAMINOPHEN 325 MG PO TABS
650.0000 mg | ORAL_TABLET | ORAL | Status: DC | PRN
  Administered 2020-01-18 (×2): 650 mg via ORAL
  Filled 2020-01-18 (×2): qty 2

## 2020-01-18 MED ORDER — ZOLPIDEM TARTRATE 5 MG PO TABS: 5.0000 mg | ORAL_TABLET | Freq: Every evening | ORAL | Status: DC | PRN

## 2020-01-18 MED ORDER — COCONUT OIL OIL: 1.0000 "application " | TOPICAL_OIL | Status: DC | PRN

## 2020-01-18 MED ORDER — MEASLES, MUMPS & RUBELLA VAC IJ SOLR
0.5000 mL | Freq: Once | INTRAMUSCULAR | Status: AC
  Administered 2020-01-19: 0.5 mL via SUBCUTANEOUS
  Filled 2020-01-18: qty 0.5

## 2020-01-18 MED ORDER — SIMETHICONE 80 MG PO CHEW: 80.0000 mg | CHEWABLE_TABLET | ORAL | Status: DC | PRN

## 2020-01-18 NOTE — Progress Notes (Addendum)
Post Partum Day #1 Subjective: no complaints, up ad lib and tolerating PO; infant currently being observed in nursery due to fluid/mucous obstructing breathing, per pt; breastfeeding has been going well; she is unsure re contraception; desires circumcision for her son  Objective: Blood pressure 110/66, pulse 80, temperature 98.2 F (36.8 C), temperature source Oral, resp. rate 20, last menstrual period 04/22/2019, SpO2 99 %, unknown if currently breastfeeding.  Physical Exam:  General: cooperative, fatigued and no distress Lochia: appropriate Uterine Fundus: firm DVT Evaluation: No evidence of DVT seen on physical exam.  Recent Labs    01/17/20 0842  HGB 11.1*  HCT 36.7    Assessment/Plan: Plan for discharge tomorrow and Circumcision prior to discharge   CNM Circumcision Counseling Progress Note  Patient desires circumcision for her female infant.  Circumcision procedure details discussed, risks and benefits of procedure were also discussed.  These include but are not limited to: Benefits of circumcision in men include reduction in the rates of urinary tract infection (UTI), penile cancer, some sexually transmitted infections, penile inflammatory and retractile disorders, as well as easier hygiene.  Risks include bleeding , infection, injury of glans which may lead to penile deformity or urinary tract issues, unsatisfactory cosmetic appearance and other potential complications related to the procedure.  It was emphasized that this is an elective procedure.  Patient wants to proceed with circumcision; written informed consent will be obtained.  Will have MD do circumcision when infant is cleared for such by peds.    LOS: 1 day   Rose Schwartz CNM 01/18/2020, 9:54 AM

## 2020-01-18 NOTE — Clinical Social Work Maternal (Signed)
CLINICAL SOCIAL WORK MATERNAL/CHILD NOTE  Patient Details  Name: Rose Schwartz MRN: [id number] Date of Birth: 2000-11-24  Date:  01/18/2020  Clinical Social Worker Initiating Note:  Hortencia Pilar. LCSW Date/Time: Initiated:  01/18/20/0900     Child's Name:  Rose Schwartz   Biological Parents:  Mother, Father Rose Schwartz, Rose Schwartz)   Need for Interpreter:  None   Reason for Referral:  Behavioral Health Concerns (hx of Bipolar.)   Address:  [address] [place] [address]    Phone number:  [phone] (home)     Additional phone number: none   Household Members/Support Persons (HM/SP):   Household Member/Support Person 1   HM/SP Name Relationship DOB or Age  HM/SP -1 Autumn Patty MOB   19 years old   HM/SP -2   Donavan Foil  FOB   09/04/2000  HM/SP -3        HM/SP -4        HM/SP -5        HM/SP -6        HM/SP -7        HM/SP -8          Natural Supports (not living in the home):  Parent   Professional Supports: Therapist Mining engineer with [facility].)   Employment: Full-time   Type of Work: [employer] (Day[employer]).   Education:  Some Materials engineer arranged:  n/a  Surveyor, quantity Resources:  Medicaid (Healthy [employer])   Other Resources:  [employer], Sales executive  (plans to apply for [employer].)   Cultural/Religious Considerations Which May Impact Care:  none reported.   Strengths:  Ability to meet basic needs , Compliance with medical plan , Home prepared for child , Pediatrician chosen   Psychotropic Medications:     None reported to this CSW.     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 CSW went to speak with MOB to address further needs.   CSW congratulated MOB and FOB on the birth of infant. CSW noted that FOB was initially asleep and MOB was on the phone. CSW offered to return at a later time and MOB insisted that CSW stay and speak with her. CSW was asked to wake FOB up per request of his mother via phone. CSW completed task. CSW advised MOB of the reason for CSW coming to speak with her. MOB reported that she was diagnosed with Bipolar in 2019.MOB reported that she was on medication in the past but reported no medication use since. MOB reported that she is seeing a therapist at this time Asher Muir from [facility]) and reported a desire to still have further resources. CSW provided MOB with therapy resources for further needs. MOB reported that she has hx of anxiety and depression both diagnosed in 2013. MOB reported no current symptoms of anxiety and depression and reported "I feel like myself". CSW asked was this a good  thing or bad thing and MOB reported that this was a good thing for her. MOB inquired from CSW about psychosis as MOB reported that she dealt with this in the past after substance use. MOB reported a hx of psychosis in the past but reported nothing to this CSW at this time. MOB reported no substance use during her pregnancy as well. MOB denies SI, HI and DV and reported feeling well.   CSW was advised that MOB has all needed items to care for infant. MOB reported that her supports are FOB and his family as "I just recently told my family about the baby". MOB assured CSW that she has all needed items to care for infant with no other needs at this time.  CSW took time to provide MOB with PPD and SIDS education. MOB was given PPD Checklist in order to keep track of feelings as they relate to PPD. MOB reported no other needs and thanked CSW    CSW Plan/Description:  No Further Intervention Required/No Barriers to Discharge, Sudden Infant Death Syndrome (SIDS) Education, Perinatal Mood and Anxiety Disorder (PMADs) Education    Robb Matar, LCSWA 01/18/2020, 10:26 AM

## 2020-01-18 NOTE — Plan of Care (Signed)

## 2020-01-18 NOTE — Lactation Note (Signed)
This note was copied from a baby's chart. Lactation Consultation Note  Patient Name: Rose Schwartz GUYQI'H Date: 01/18/2020 Reason for consult: Initial assessment  Mother is a P1, infant is 34 hours old . Marland Kitchen  Infant in nursery care for several hours. He was dusky at delivery. Infant is jaundice under triple photo treatment.   Mother was sat up with a DEBP by staff nurse and mother feel asleep while pumping. Infant has been  very spitty and gaggy since birth.  Infant was fed several mls of ebm before getting dusky.,  Infant in mothers room and stable.  LC arrived in the room and nurse has infant placed in cradle hold . Infant showing feeding cues. Infant placed in football hold .  Infant latched on for 5 mins with strong tugs. Observed a few swallows. Mother has large amts of colostrum when hand expressed.   Infant was fed 2 ml of ebm with a spoon and tolerated well.  Mother advised to hand express and pump every 2-3 hours for 15 mins. Mother receptive to all teaching.  Discussed importance of infant getting volume .mother is aware of available donor milk.   Mother to continue to cue base feed infant and feed at least 8-12 times or more in 24 hours and advised to allow for cluster feeding infant as needed.   Mother to continue to due STS. Mother is aware of available LC services at [facility], BFSG'S, OP Dept, and phone # for questions or concerns about breastfeeding.  Mother receptive to all teaching and plan of care.   Maternal Data    Feeding Feeding Type: Breast Milk  LATCH Score Latch: Repeated attempts needed to sustain latch, nipple held in mouth throughout feeding, stimulation needed to elicit sucking reflex.  Audible Swallowing: A few with stimulation  Type of Nipple: Everted at rest and after stimulation  Comfort (Breast/Nipple): Soft / non-tender  Hold (Positioning): Assistance needed to correctly position infant at breast and maintain latch.  LATCH Score:  7  Interventions Interventions: Assisted with latch;Skin to skin;Hand express;Breast compression;Adjust position;Support pillows;Position options;DEBP  Lactation Tools Discussed/Used Initiated by:: Alfonzo Feller Date initiated:: 01/18/20   Consult Status Consult Status: Follow-up Date: 01/19/20 Follow-up type: In-patient    Stevan Born [facility] 01/18/2020, 2:54 PM

## 2020-01-18 NOTE — Plan of Care (Signed)
  Problem: Education: Goal: Knowledge of condition will improve Outcome: Completed/Met   Problem: Activity: Goal: Will verbalize the importance of balancing activity with adequate rest periods Outcome: Completed/Met Goal: Ability to tolerate increased activity will improve Outcome: Completed/Met   Problem: Life Cycle: Goal: Chance of risk for complications during the postpartum period will decrease Outcome: Completed/Met   

## 2020-01-18 NOTE — Plan of Care (Signed)
  Problem: Education: Goal: Knowledge of condition will improve Outcome: Completed/Met   Problem: Activity: Goal: Will verbalize the importance of balancing activity with adequate rest periods Outcome: Completed/Met Goal: Ability to tolerate increased activity will improve Outcome: Completed/Met   Problem: Life Cycle: Goal: Chance of risk for complications during the postpartum period will decrease Outcome: Completed/Met   Problem: Role Relationship: Goal: Ability to demonstrate positive interaction with newborn will improve Outcome: Completed/Met   Problem: Pain Managment: Goal: General experience of comfort will improve Outcome: Completed/Met   Problem: Safety: Goal: Ability to remain free from injury will improve Outcome: Completed/Met   Problem: Education: Goal: Knowledge of condition will improve Outcome: Completed/Met   Problem: Activity: Goal: Will verbalize the importance of balancing activity with adequate rest periods Outcome: Completed/Met Goal: Ability to tolerate increased activity will improve Outcome: Completed/Met   Problem: Life Cycle: Goal: Chance of risk for complications during the postpartum period will decrease Outcome: Completed/Met   Problem: Role Relationship: Goal: Ability to demonstrate positive interaction with newborn will improve Outcome: Completed/Met   Problem: Pain Managment: Goal: General experience of comfort will improve Outcome: Completed/Met   Problem: Safety: Goal: Ability to remain free from injury will improve Outcome: Completed/Met   Problem: Education: Goal: Knowledge of condition will improve Outcome: Completed/Met   Problem: Activity: Goal: Will verbalize the importance of balancing activity with adequate rest periods Outcome: Completed/Met Goal: Ability to tolerate increased activity will improve Outcome: Completed/Met   Problem: Life Cycle: Goal: Chance of risk for complications during the postpartum period  will decrease Outcome: Completed/Met   Problem: Role Relationship: Goal: Ability to demonstrate positive interaction with newborn will improve Outcome: Completed/Met   Problem: Pain Managment: Goal: General experience of comfort will improve Outcome: Completed/Met   Problem: Safety: Goal: Ability to remain free from injury will improve Outcome: Completed/Met   Problem: Education: Goal: Knowledge of condition will improve Outcome: Completed/Met   Problem: Activity: Goal: Will verbalize the importance of balancing activity with adequate rest periods Outcome: Completed/Met Goal: Ability to tolerate increased activity will improve Outcome: Completed/Met   Problem: Life Cycle: Goal: Chance of risk for complications during the postpartum period will decrease Outcome: Completed/Met   Problem: Role Relationship: Goal: Ability to demonstrate positive interaction with newborn will improve Outcome: Completed/Met   Problem: Pain Managment: Goal: General experience of comfort will improve Outcome: Completed/Met   Problem: Safety: Goal: Ability to remain free from injury will improve Outcome: Completed/Met

## 2020-01-19 MED ORDER — ACETAMINOPHEN 325 MG PO TABS: 650.0000 mg | ORAL_TABLET | ORAL | 0 refills | Status: DC | PRN

## 2020-01-19 MED ORDER — IBUPROFEN 600 MG PO TABS: 600.0000 mg | ORAL_TABLET | Freq: Four times a day (QID) | ORAL | 0 refills | Status: DC

## 2020-01-19 NOTE — Lactation Note (Signed)
This note was copied from a baby's chart. Lactation Consultation Note  Patient Name: Rose Schwartz GUYQI'H Date: 01/19/2020 Reason for consult: Follow-up assessment;Hyperbilirubinemia;Early term 37-38.6wks   Infant on triple photo.  Bili increased.  LC discussed with mom importance of pumping for insurance of milk supply, feeding infant back EBM along with donor milk, and keeping infant awake during feeding.   Mom's breasts are filling.  LC offered to assist with feeding.   Mom positioned on couch.  Dad was taught pillow support and rolled blanket use along with burp cloth under breast.  Mom was previously nursing in cradle.  LC worked with her on cross cradle and football.  Hand expression done, 6 mls collected into container.    Mom BF on right side in cross cradle.  Infant does not open widely but chin was tugged to increase gape and flange bottom lip.  Infant fed 15 minutes then fell asleep.  Mom broke latched and fed in football on left side.  Infant fed more vigorously with more sucking and increased swallows heard.  Compression used.  Dad actively listening.  He was taught tea cup hold to assist with getting more tissue into mouth with latch.  Infant fell asleep after 15 minutes.  Parents will bottle feed dm 25 ml.    Plan:  Mom will BF then supplement after feeding.  When dad is supplementing mom will pump.  DEBP reviewed with family. Hand expression strongly encouraged and dad was taught how to express.  Support group information and OP services shared with family.  Maternal Data Has patient been taught Hand Expression?: Yes Does the patient have breastfeeding experience prior to this delivery?: No  Feeding Feeding Type: Breast Fed  LATCH Score Latch: Repeated attempts needed to sustain latch, nipple held in mouth throughout feeding, stimulation needed to elicit sucking reflex.  Audible Swallowing: A few with stimulation  Type of Nipple: Everted at rest and after  stimulation  Comfort (Breast/Nipple): Filling, red/small blisters or bruises, mild/mod discomfort  Hold (Positioning): Assistance needed to correctly position infant at breast and maintain latch.  LATCH Score: 6  Interventions Interventions: Breast feeding basics reviewed;Assisted with latch;Skin to skin;Breast massage;Hand express;Position options;Support pillows;Adjust position;DEBP;Breast compression  Lactation Tools Discussed/Used WIC Program: Yes (signing up for [facility] today) Pump Review: Setup, frequency, and cleaning;Milk Storage   Consult Status Consult Status: Follow-up Date: 01/20/20 Follow-up type: In-patient    Maryruth Hancock [facility] 01/19/2020, 10:18 AM

## 2020-01-19 NOTE — Lactation Note (Signed)
This note was copied from a baby's chart. Lactation Consultation Note  Patient Name: Rose Schwartz HFWYO'V Date: 01/19/2020   LC entered room.  Infant under lights, mom and dad asleep.  LC will come back.  Maternal Data    Feeding Feeding Type: Breast Fed  LATCH Score                   Interventions    Lactation Tools Discussed/Used     Consult Status      Maryruth Hancock [facility] 01/19/2020, 7:52 AM

## 2020-01-19 NOTE — Discharge Instructions (Signed)

## 2020-01-20 ENCOUNTER — Encounter: Payer: Medicaid Other | Admitting: Obstetrics and Gynecology

## 2020-01-20 ENCOUNTER — Ambulatory Visit: Payer: Self-pay

## 2020-01-20 NOTE — Lactation Note (Signed)
This note was copied from a baby's chart. Lactation Consultation Note  Patient Name: Rose Schwartz MPNTI'R Date: 01/20/2020 Reason for consult: Follow-up assessment;Nipple pain/trauma;1st time breastfeeding;Hyperbilirubinemia;Early term 37-38.6wks  LC in to visit with P1 Mom of ET infant on triple phototherapy. Baby 57 hrs old and at 6% weight loss.  Bilirubin 14.5 at 46 hrs of age.  Baby has been exclusively breastfeeding with donor breast milk supplements.    Mom's breasts are filling (not engorged) and she had just pumped and expressed 90 ml.  Baby had not breastfed yet,    Assisted a cueing baby to latch.  Mom using cradle hold and has been complaining of pinching and soreness on her nipples.  Skin on nipples look intact without trauma currently.  Mom assisted to use cross cradle hold to better control baby's latch.  Lips needed flanging once baby was latched.  Nutritive sucking and swallowing observed.   Reminded parents of normal sleepiness with jaundice, and temporary need to supplement with EBM until bilirubin decreases.  Plan- 1- Watch baby for feeding cues, offering the breast at least every 3 hrs.  Baby should remain under phototherapy as much as possible, using bili blanket against baby's back when he is breastfeeding. 2- Pump both breasts for 15 mins. 3- Offer baby supplement of 30 ml EBM by paced bottle (taught parents how to pace bottle feed) 4- ask for help prn.   Feeding Feeding Type: Breast Fed  LATCH Score Latch: Grasps breast easily, tongue down, lips flanged, rhythmical sucking.  Audible Swallowing: Spontaneous and intermittent  Type of Nipple: Everted at rest and after stimulation  Comfort (Breast/Nipple): Filling, red/small blisters or bruises, mild/mod discomfort  Hold (Positioning): Assistance needed to correctly position infant at breast and maintain latch.  LATCH Score: 8  Interventions Interventions: Breast feeding basics reviewed;Assisted  with latch;Skin to skin;Breast massage;Hand express;Pre-pump if needed;Breast compression;Adjust position;Support pillows;Position options;Expressed milk;DEBP  Lactation Tools Discussed/Used Tools: Pump;Bottle Breast pump type: Double-Electric Breast Pump   Consult Status Consult Status: Follow-up Date: 01/21/20 Follow-up type: In-patient    Judee Clara 01/20/2020, 8:32 AM

## 2020-01-20 NOTE — Lactation Note (Signed)
This note was copied from a baby's chart. Lactation Consultation Note Mom called for assistance. RN assisted. LC attempted to see mom, mom sleeping.  Patient Name: Rose Schwartz WIOMB'T Date: 01/20/2020     Maternal Data    Feeding Feeding Type: Donor Breast Milk Nipple Type: Slow - flow  LATCH Score                   Interventions    Lactation Tools Discussed/Used     Consult Status      Rose Schwartz G 01/20/2020, 12:09 AM

## 2020-01-21 ENCOUNTER — Ambulatory Visit: Payer: Self-pay

## 2020-01-21 NOTE — Lactation Note (Signed)
This note was copied from a baby's chart. Lactation Consultation Note  Patient Name: Rose Schwartz YFVCB'S Date: 01/21/2020 Reason for consult: Follow-up assessment   infant is 84 hours and 37+2 weeks gest. Infant is at 7 % wt loss today.   Mother is breastfeeding and bottle feeding breastmilk.   Infant remains under triple photo treatment. Mothers breast are very full and firm . She was given Ice packs for her breast and assist with massage.mother to continue to use ice every 3-4 hours for 15 mins.    Mother to continue to pump. She is pumping approx 120 ml .  Mother has milk stored in the refrigerator.   Advised mother to continue to offer breast and do good breast compression.  Infant to remain on triple photo tx until this p,m and then decrease to single.      Maternal Data    Feeding Feeding Type: Breast Fed  LATCH Score                   Interventions Interventions: DEBP;Ice  Lactation Tools Discussed/Used     Consult Status Consult Status: Follow-up    Stevan Born [facility] 01/21/2020, 10:59 AM

## 2020-01-22 ENCOUNTER — Ambulatory Visit: Payer: Self-pay

## 2020-01-22 NOTE — Lactation Note (Signed)
This note was copied from a baby's chart. Lactation Consultation Note  Patient Name: Rose Schwartz TKWIO'X Date: 01/22/2020 Reason for consult: Follow-up assessment;Engorgement;Hyperbilirubinemia 5 day old 32 weeker in hospital on triple lights for Hyperbili and he has been decreased to 2 bili lights. Bilirubin level declining 13. 9 mg/ dl. Infant in Dad's arms getting sunlight. Dad stated infant last fed 1 hour ago 60 ml. Infant showing signs of cueing, Dad gave additional 45 ml at 1100 am.   Mom has breast soreness. LC exam noted breast warm to touch fully engorged with palpable ducts noted in outer quadrant of right and left breast. Mom hand expressed with squirting milk from both breasts. We attached the EBM with 27 flange and noted some skin irritation with pumping and increase her flange size to 30. Mom states she pumped off 9 am 90 ml on the left and 50 ml on right. Mom states she had more engorgement on the right and not able to get out as much milk.   Since we arrived, she has been able to pump 180 ml off of both breasts. LC created a hands free bray with mesh panties and she is still pumping to alleviate engorgement. Mom has more than adequate milk supply and had not been pumping consistently.,   Education given on frequency of pumping every 2 to 3 hours for at least 15 minutes. Mom given education on signs of mastitis.  Mom pumped off last 90 ml and breasts are softened and milk plugs are not as pronounced.   Dad educated on how to assist with massaging the breast during pumping to increase let down.  Also, Mom instructed infant under the bili lights will make the infant sleepy. She will need to make sure he feeds 8-12 times in a 24 hour period.   Mom will follow up with next pumping session in 2 hours. We created an ice pack to help with the swelling.  Mom instructed to call the nurse if she needs assistance if engorgement becomes an issue again.     Maternal Data     Feeding Feeding Type: Bottle Fed - Breast Milk Nipple Type: Slow - flow  LATCH Score                   Interventions    Lactation Tools Discussed/Used Tools: Flanges;Pump;Nipple Dorris Carnes;Bottle Flange Size: 30 Breast pump type: Double-Electric Breast Pump   Consult Status      Nikeia Henkes  Nicholson-Springer 01/22/2020, 11:01 AM

## 2020-01-23 ENCOUNTER — Ambulatory Visit: Payer: Self-pay

## 2020-01-23 NOTE — Lactation Note (Addendum)
This note was copied from a baby's chart. Lactation Consultation Note  Patient Name: Rose Schwartz  VPXTG'G Date: 01/23/2020   [name] now 59 days old.   Mom attempting to breastfeed on arrival on left breast. Mom has body turned out and not tummy to mommy. Put pillow under infant and assisted with feeding.  Showed mom how to do reverse pressure softening on left breast to help nipple evert some.  Mom has full breasts. Infant latched and breastfed on left breast in cross cradle hold.  Infant has some rythmic sucking and audible swallows.  Still somewhat  sleepy at the breast.  Infant fed for 15 minutes on right and fell asleep.  Minimal assist on left breast.  Mom still wants to do cradle hold and turn infants  body away and put nipple in his mouth.  Mom able to manually evert nipple to assist with latching.Infant latched on left breast for close to another 10 minutes and fell asleep.Showed mom how to massage down and com[press to help him get more milk while feeding.  Encouraged her to do while suckling.   Infant content/arms to side and let go and fell asleep.   Dad unable to get him to take supplement.  Infant has void and poop urged dad to change him to wake him up so he can offer supplement.LC reviewed both supplement sheets with mom for when baby breastfeeds and what supplement amount to give and when baby is bottlefeeding only and what amount of supplement to give.  Infant transitioned to yellow stools.  Mom happy they are yellow.  Mom does not have a pump at home. Demo how to use harmony manual pump and [facility] referral sent.  Parents have breastfeeding resource list for home use.  Urged to continue to pump past breastfeeds and feed back all expressed mother milk.  Discussed how as infant gets better and better with breastfeeding he should take less and less supplement.  Dad able to get infant to take supplement past diaper change. Urged to call lactation as needed      Maternal  Data    Feeding Feeding Type: Bottle Fed - Breast Milk Nipple Type: Slow - flow  LATCH Score                   Interventions    Lactation Tools Discussed/Used     Consult Status      Rose Schwartz 01/23/2020, 2:24 PM

## 2020-01-24 ENCOUNTER — Ambulatory Visit: Payer: Self-pay

## 2020-01-24 NOTE — Lactation Note (Signed)
This note was copied from a baby's chart. Lactation Consultation Note  Patient Name: Rose Schwartz Date: 01/24/2020 Reason for consult: Follow-up assessment;Hyperbilirubinemia  Follow up with 77 days old LPTI with 0.57% weight loss and hyperbilirubinemia. Mother reports breastfeeding is going well. Baby is feeding ~15 minutes each breast and he seems content after each feeding. Mother reports some nipple discomfort at times with breast feeding. Discussed deep latch to improve milk transfer and avoid any nipple discomfort. Reviewed nipple-to-nose technique and supporting breast. Baby has a good output, 3 voids and 3 stools. FOB changed a stool diaper at this visit. Mother explained pumping is going well, she has ~10 bottles of MBM store in refrigerator. Observed MBM inside pump tubing. Assisted with cleaning tubing. Reviewed best posture to pump and how to position bottles when pumping.  Mother reports breast fullness, firmness and lumps in her breasts. Discussed engorgement signs and how to use ice. Mother mentioned she is already massaging for relief and was provided refillable ice bags.  Mother mentioned some nipple discomfort and sensitivity. Mother has coconut oil and has been using it lubricate skin. Offered comfort gels and explained how to use them. Mother verbalized understanding.  Reviewed milk storage guidelines and how to warm up MBM safely. Encouraged mother to offer the breast first, then MBM. Parents are aware and  confident.  Praised both parents for their efforts and dedication. Encouraged to contact lactation services as needed for any questions or concerns.   Feeding Feeding Type: Bottle Fed - Breast Milk Nipple Type: Slow - flow  Interventions Interventions: DEBP;Comfort gels;Ice  Lactation Tools Discussed/Used Tools: Pump;Flanges;Comfort gels;Coconut oil;Bottle Flange Size: 30 Breast pump type: Double-Electric Breast Pump   Consult Status Consult Status:  Follow-up Date: 01/25/20 Follow-up type: Call as needed    Ethne Jeon A Higuera Ancidey 01/24/2020, 10:48 AM

## 2020-01-25 NOTE — BH Specialist Note (Signed)
Integrated Behavioral Health via Telemedicine Phone  Visit  01/25/2020 PORSCHIA WILLBANKS [id number]  Number of Integrated Behavioral Health visits: 6  Session Start time: 1:15  Session End time: 1:46 Total time: 31  Referring Provider: Venia Carbon, NP Type of Visit: Phone Patient/Family location: [place] Provider location: Center for [facility] Women  All persons participating in visit: Patient Darneshia Demary and [facility] Derel Mcglasson    Confirmed patient's address: Yes  Confirmed patient's phone number: Yes  Any changes to demographics: No   Confirmed patient's insurance: Yes  Any changes to patient's insurance: No   Discussed confidentiality: at previous visi  I connected with [name] by a video enabled telemedicine application (Caregility) and verified that I am speaking with the correct person using two identifiers.     I discussed the limitations of evaluation and management by telemedicine and the availability of in person appointments.  I discussed that the purpose of this visit is to provide behavioral health care while limiting exposure to the novel coronavirus.   Discussed there is a possibility of technology failure and discussed alternative modes of communication if that failure occurs.  I discussed that engaging in this virtual visit, they consent to the provision of behavioral healthcare and the services will be billed under their insurance.  Patient and/or legal guardian expressed understanding and consented to virtual visit: Yes   PRESENTING CONCERNS: Patient and/or family reports the following symptoms/concerns: Pt states her primary symptoms are worry and fear, that she attributes to worry over baby's longer stay in hospital due to jaundice; pt feels well-supported at home via friends and family, had a good waterbirth experience;  breastfeeding well. Pt agrees to consider ongoing therapy and/or   referral to psychiatry if needed in the future (managing without BH meds now). Duration of problem: Postpartum; Severity of problem: moderate  STRENGTHS (Protective Factors/Coping Skills): Supportive friends/family/partner; positive outlook  GOALS ADDRESSED: Patient will: 1.  Maintain reduction of symptoms of: mood instability  2.  Increase knowledge and/or ability of: healthy habits  3.  Demonstrate ability to: Increase healthy adjustment to current life circumstances and Increase adequate support systems for patient/family  INTERVENTIONS: Interventions utilized:  Supportive Counseling, Psychoeducation and/or Health Education and Link to [employer] Standardized Assessments completed: GAD-7 and PHQ 9  ASSESSMENT: Patient currently experiencing Bipolar 1 disorder.   Patient may benefit from continued psychoeducation and brief therapeutic interventions regarding coping with symptoms of anxiety and depression  .  PLAN: 1. Follow up with behavioral health clinician on : Two weeks. Call Brady Plant at [phone] earlier than scheduled appointment, if needed 2. Behavioral recommendations:  -Continue taking prenatal vitamin daily, until postpartum medical visit -Prioritize self-care by sleeping when baby sleeps; eating when hungry  -Consider registering for and attending new mom support group at [email or web address] or [email or web address]  3. Referral(s): Integrated Art gallery manager (In Clinic) and [employer]:  New mom support  I discussed the assessment and treatment plan with the patient and/or parent/guardian. They were provided an opportunity to ask questions and all were answered. They agreed with the plan and demonstrated an understanding of the instructions.   They were advised to call back or seek an in-person evaluation if the symptoms worsen or if the condition fails to improve as anticipated.  Valetta Close [facility]  Depression screen [facility] 2/9 01/11/2020  01/03/2020 11/16/2019 11/16/2019 10/14/2019  Decreased Interest 1 1 1 1 1   Down, Depressed, Hopeless 1 1 1  1 1  PHQ - 2 Score 2 2 2 2 2   Altered sleeping 1 3 1 1 3   Tired, decreased energy 1 2 2 2 2   Change in appetite 2 2 2 2 1   Feeling bad or failure about yourself  2 3 2 1 1   Trouble concentrating 1 2 2 2 2   Moving slowly or fidgety/restless 0 1 0 0 0  Suicidal thoughts 0 0 0 0 0  PHQ-9 Score 9 15 11 10 11    GAD 7 : Generalized Anxiety Score 01/11/2020 01/03/2020 11/16/2019 11/16/2019  Nervous, Anxious, on Edge 1 1 1 1   Control/stop worrying 2 1 1 2   Worry too much - different things 2 2 1 2   Trouble relaxing 1 1 0 0  Restless 1 1 0 0  Easily annoyed or irritable 2 2 3 3   Afraid - awful might happen 3 2 1 1   Total GAD 7 Score 12 10 7  9

## 2020-01-27 ENCOUNTER — Encounter: Payer: Medicaid Other | Admitting: Medical

## 2020-02-01 ENCOUNTER — Ambulatory Visit (INDEPENDENT_AMBULATORY_CARE_PROVIDER_SITE_OTHER): Payer: Medicaid Other | Admitting: Clinical

## 2020-02-01 ENCOUNTER — Other Ambulatory Visit: Payer: Self-pay

## 2020-02-01 DIAGNOSIS — F319 Bipolar disorder, unspecified: Secondary | ICD-10-CM | POA: Diagnosis not present

## 2020-02-01 NOTE — BH Specialist Note (Addendum)
Integrated Behavioral Health via Telemedicine Video (Caregility) Visit  02/01/2020 Rose Schwartz [id number]  Number of Integrated Behavioral Health visits: 7 Session Start time: 2:47  Session End time: 3:10 Total time: 23  Referring Provider: Venia Carbon, NP Type of Visit: Video Patient/Family location: Home [facility] Provider location: Center for [facility] at [facility] for Women  All persons participating in visit: [name] and [facility] Decarlo Rivet    Confirmed patient's address: Yes  Confirmed patient's phone number: Yes  Any changes to demographics: No   Confirmed patient's insurance: Yes  Any changes to patient's insurance: No   Discussed confidentiality: at previous visi  I connected with Rose Schwartz by a video enabled telemedicine application (Caregility) and verified that I am speaking with the correct person using two identifiers.     I discussed the limitations of evaluation and management by telemedicine and the availability of in person appointments.  I discussed that the purpose of this visit is to provide behavioral health care while limiting exposure to the novel coronavirus.   Discussed there is a possibility of technology failure and discussed alternative modes of communication if that failure occurs.  I discussed that engaging in this virtual visit, they consent to the provision of behavioral healthcare and the services will be billed under their insurance.  Patient and/or legal guardian expressed understanding and consented to virtual visit: Yes   PRESENTING CONCERNS: Patient and/or family reports the following symptoms/concerns: Pt states her primary concern today is interpersonal conflict with family; helps to talk through her feelings. Pt is setting healthy emotional boundaries as needed, and adjusting well to motherhood.  Duration of problem: Ongoing ; Severity of problem: mild  STRENGTHS (Protective  Factors/Coping Skills): Good social support; setting healthy boundaries, bonding well with baby, positive outlook  GOALS ADDRESSED: Patient will: 1.  Reduce symptoms of: mood instability  2.  Demonstrate ability to: Increase healthy adjustment to current life circumstances and Increase adequate support systems for patient/family  INTERVENTIONS: Interventions utilized:  Supportive Counseling and Link to [employer] Standardized Assessments completed: Not Needed  ASSESSMENT: Patient currently experiencing Bipolar 1 disorder .   Patient may benefit from continued psychoeducation and brief therapeutic interventions regarding maintaining reduction of mood instability .  PLAN: 1. Follow up with behavioral health clinician on : As needed 2. Behavioral recommendations:  -Establish with therapist of choice to continue working on family relationship issues, as part of your own self-care (list on After Visit Summary) -Continue setting healthy boundaries in personal relationships -Continue prioritizing eating and sleeping well daily -Consider registering for and attending new mom support group at [email or web address].net or [email or web address] 3. Referral(s): Integrated Art gallery manager (In Clinic) and [employer]:  New mom support  I discussed the assessment and treatment plan with the patient and/or parent/guardian. They were provided an opportunity to ask questions and all were answered. They agreed with the plan and demonstrated an understanding of the instructions.   They were advised to call back or seek an in-person evaluation if the symptoms worsen or if the condition fails to improve as anticipated.  Valetta Close [facility]  Depression screen [facility] 2/9 02/01/2020 01/11/2020 01/03/2020 11/16/2019 11/16/2019  Decreased Interest 1 1 1 1 1   Down, Depressed, Hopeless 0 1 1 1 1   PHQ - 2 Score 1 2 2 2 2   Altered sleeping 1 1 3 1 1   Tired, decreased energy 0 1 2 2 2   Change in  appetite 2 2 2  2 2  Feeling bad or failure about yourself  0 2 3 2 1   Trouble concentrating 1 1 2 2 2   Moving slowly or fidgety/restless 1 0 1 0 0  Suicidal thoughts 0 0 0 0 0  PHQ-9 Score 6 9 15 11 10    GAD 7 : Generalized Anxiety Score 02/01/2020 01/11/2020 01/03/2020 11/16/2019  Nervous, Anxious, on Edge 1 1 1 1   Control/stop worrying 2 2 1 1   Worry too much - different things 3 2 2 1   Trouble relaxing 1 1 1  0  Restless 0 1 1 0  Easily annoyed or irritable 0 2 2 3   Afraid - awful might happen 2 3 2 1   Total GAD 7 Score 9 12 10  7

## 2020-02-03 ENCOUNTER — Encounter: Payer: Self-pay | Admitting: Obstetrics and Gynecology

## 2020-02-15 ENCOUNTER — Other Ambulatory Visit: Payer: Self-pay

## 2020-02-15 ENCOUNTER — Ambulatory Visit (INDEPENDENT_AMBULATORY_CARE_PROVIDER_SITE_OTHER): Payer: Medicaid Other | Admitting: Clinical

## 2020-02-15 DIAGNOSIS — F319 Bipolar disorder, unspecified: Secondary | ICD-10-CM

## 2020-02-15 NOTE — Patient Instructions (Signed)
[facility] [address] [place], [place] [address] [phone] (main office) [phone] (Shemuel Harkleroad's office)  Behavioral Health Resources:   What if I or someone I know is in crisis?  . If you are thinking about harming yourself or having thoughts of suicide, or if you know someone who is, seek help right away.  . Call your doctor or mental health care provider.  . Call 911 or go to a hospital emergency room to get immediate help, or ask a friend or family member to help you do these things.  . Call the [place] National Suicide Prevention Lifeline's toll-free, 24-hour hotline at 1-800-273-TALK (1-800-273-8255) or TTY: 1-800-799-4 TTY (1-800-799-4889) to talk to a trained counselor.  . If you are in crisis, make sure you are not left alone.   . If someone else is in crisis, make sure he or she is not left alone   24 Hour :   [place] National Suicide Hotline: 1-800-273-8255  Therapeutic Alternative Mobile Crisis: 1-877-626-1772   [facility]  [address], [place], [place] [address]  800-711-2635 or 336-832-9700  [facility] Crisis Line (Domestic Violence, Rape & Victim Assistance)  336-273-7273  [facility]  [address] [place], [place]  [address]   1-855-788-8787 or 336-676-6840   [facility] [place] [facility] Services: 336-899-1505 (8am-4pm) or 1-866- 261-5769 (after hours)           [facility], [address], [place], [place]  1-800-711-2635 Fax: 336-832-9701 [email or web address]  *Interpreters available *Accepts Medicaid, Medicare, uninsured  [facility]   Mon-Fri: 8am-5pm [address], [place], [place] [phone](phone); [phone](fax) www.[email or web address]  *Accepts Medicare  [facility] Mon, Tues, Thurs, Fri:  8am-4pm [address], [place], [place]  [phone] (phone); 336-256-0121 (fax) www.[email or web address]  *Accepts Medicare  [facility] Mon-Fri: 9am-5pm  [address], [place], [place] [phone] (phone); 336-540-9454  www.[email or web address]  *Accepts Medicaid  [facility] [address], [place], [place]  [phone]  [email or web address]://[email or web address]   [facility] Mon-Fri, 8:30am-12pm/1pm-2:30pm [address], [place], [place] [phone] (phone); 336-387-9167 (fax) www.fspcares.org  *Accepts Medicaid, sliding-scale*Bilingual services available  [facility] Mon-Fri, 8am-7pm [address], [place], [place]  [phone](phone); 336-899-8811(fax) www.famsolutions.org  *Accepts Medicaid *Bilingual services available  [facility] Mon-Fri: 8am-5pm, Saturday by appointment only [address], [place], [place] [phone] (phone); [phone] (fax) www.[email or web address]   [facility] [address], [place], [place] [phone] www.[email or web address]  *Free & reduced services for uninsured and underinsured individuals *Bilingual services for Spanish-speaking clients 21 and under  [facility], [address], [place],  [place] [phone](phone); 336-676-6409(fax) www.[facility]archnc.org  *Bring your own interpreter at first visit *Accepts Medicare and Medicaid  [facility] Mon-Fri: 9am-5:30pm [address], [place], [place] [phone] (phone), [phone] (fax) After hours crisis line: 336-763-1165 www.[email or web address]  *Accepts Medicare and Medicaid  [facility] Mon-Thurs, 8am-6pm [address] [place] [address], [place], [place]  [phone] (phone); 336-288-0738  (fax) http://[email or web address]  *Subsidized costs available  [facility] Mon-Fri: 8am-4pm [address], [place], [place] [phone](phone); [phone](fax) www.p[email or web address]  *Accepts Medicaid  [facility] [place] Same day access hours: Mon-Fri, 8:30-3pm Crisis hours: Mon-Fri, 8am-5pm [address], [place], [place]  [facility] Same day access hours: Mon-Fri, 8:30-3pm Crisis hours: Mon-Fri, 8am-8pm [address], , [place] [phone] (phone);   [phone] (fax) www.[facility]hahealth[facility].org  *Accepts Medicaid and Medicare  [facility] Mon, Wed, Fri: 9am-9pm Tues, Thurs: 9am-6pm [address], [place], [place]  [phone] (phone); [phone] (fax) https://[email or web address]  *(Accepts Medicare and Medicaid; payment plans available)*Bilingual services available  [facility] [address], [place], [place] [phone] (phone); [phone] (fax) www.[email or web address]   [facility] [address], [place], [place]  [phone]  www.[email or web address]  *Bilingual services available  [facility] Mon-Thurs: 8am-8pm [address], [place], [place] [phone] (phone); [phone] (fax) [email or web address]://[email or web address]  *Accepts Medicaid*Bilingual services available  [facility] [address], [place], [place] [phone] (phone) [email or web address]://[email or web address]  *Accepts Medicaid *Bilingual services available  [facility] Mon-Fri, 9am-4:45pm [address], [place], [place] [phone] (phone); [phone] (fax) http://[email or web address]-[email or web address]  *Accepts Medicare  [facility] Mon-Thurs: 8:30-8pm, Fri: 8:30am-7pm [address], [place], [place] (3rd floor) [phone] (phone); [phone]  (fax) [email or web address]://[email or web address]  *Accepts Medicaid; income-based reduced rates available  [facility] Mon-Fri: 8am-5pm [address], [place], [place] [phone] (phone); [phone] (fax) http://www.[email or web address]  *Accepts Medicaid*Bilingual services available  [facility] [address], [place],   [phone] (phone); [phone] (fax) www.youthfocus.org  *Free emergency housing and clinical services for youth in crisis  [facility] [place])  [address], [place] 336-373-1402 www.mhag.org  *Provides direct services to individuals in recovery from mental illness, including support groups, recovery skills classes, and one on one peer support  NAMI (National Alliance on Mental Illness) [place] NAMI helpline: 336-370-4264  https://namiguilford.org  *A community hub for information relating to local resources and services for the friends and families of individuals living alongside a mental health condition, as well as the individuals themselves. Classes and support groups also provided     

## 2020-02-16 ENCOUNTER — Other Ambulatory Visit: Payer: Self-pay

## 2020-02-16 ENCOUNTER — Encounter: Payer: Self-pay | Admitting: Medical

## 2020-02-16 ENCOUNTER — Ambulatory Visit (INDEPENDENT_AMBULATORY_CARE_PROVIDER_SITE_OTHER): Payer: Medicaid Other | Admitting: Medical

## 2020-02-16 ENCOUNTER — Other Ambulatory Visit (HOSPITAL_COMMUNITY)
Admission: RE | Admit: 2020-02-16 | Discharge: 2020-02-16 | Disposition: A | Discharge status: Home / Self Care | Payer: Medicaid Other | Source: Ambulatory Visit | Attending: Medical | Admitting: Medical

## 2020-02-16 DIAGNOSIS — Z3202 Encounter for pregnancy test, result negative: Secondary | ICD-10-CM | POA: Diagnosis not present

## 2020-02-16 DIAGNOSIS — K5901 Slow transit constipation: Secondary | ICD-10-CM

## 2020-02-16 LAB — POCT PREGNANCY, URINE: Preg Test, Ur: NEGATIVE

## 2020-02-16 MED ORDER — LEVONORGESTREL 19.5 MCG/DAY IU IUD
INTRAUTERINE_SYSTEM | Freq: Once | INTRAUTERINE | Status: AC
  Administered 2020-02-16: 1 via INTRAUTERINE

## 2020-02-16 MED ORDER — SENNA 8.6 MG PO TABS: 1.0000 | ORAL_TABLET | Freq: Every day | ORAL | 0 refills | Status: DC

## 2020-02-16 NOTE — Patient Instructions (Signed)

## 2020-02-16 NOTE — Progress Notes (Signed)
Post Partum Visit Note  Rose Schwartz is a 19 y.o. G60P1001 female who presents for a postpartum visit. She is 4 weeks postpartum following a normal spontaneous vaginal delivery.  I have fully reviewed the prenatal and intrapartum course. The delivery was at 37.2 gestational weeks.  Anesthesia: IV sedation. Postpartum course has been uncomplicated. Baby is doing well. Baby is feeding by breast. Bleeding staining only. Bowel function is abnormal: constipation. Bladder function is normal. Patient is not sexually active. Contraception method is abstinence. Postpartum depression screening: negative.    Edinburgh Postnatal Depression Scale - 02/16/20 1212      Edinburgh Postnatal Depression Scale:  In the Past 7 Days   I have been able to laugh and see the funny side of things. 0    I have looked forward with enjoyment to things. 0    I have blamed myself unnecessarily when things went wrong. 0    I have been anxious or worried for no good reason. 2    I have felt scared or panicky for no good reason. 2    Things have been getting on top of me. 1    I have been so unhappy that I have had difficulty sleeping. 0    I have felt sad or miserable. 0    I have been so unhappy that I have been crying. 0    The thought of harming myself has occurred to me. 0    Edinburgh Postnatal Depression Scale Total 5           The following portions of the patient's history were reviewed and updated as appropriate: allergies, current medications, past family history, past medical history, past social history, past surgical history and problem list.  Review of Systems Pertinent items are noted in HPI.    Objective:  Blood pressure 99/73, pulse 96, height 5\' 1"  (1.549 m), weight 126 lb (57.2 kg), last menstrual period 04/22/2019, currently breastfeeding.  General:  alert and cooperative   Breasts:  not evaluated  Lungs: clear to auscultation bilaterally  Heart:  regular rate and rhythm, S1, S2  normal, no murmur, click, rub or gallop  Abdomen: soft, non-tender; bowel sounds normal; no masses,  no organomegaly   Vulva:  normal  Vagina: normal vagina  Cervix:  multiparous appearance no lesions, scant bleeding following pap  Corpus: not examined  Adnexa:  not evaluated  Rectal Exam: Not performed.           GYNECOLOGY CLINIC PROCEDURE NOTE  IUD Insertion Procedure Note Patient identified, informed consent performed.  Discussed risks of irregular bleeding, cramping, infection, malpositioning or misplacement of the IUD outside the uterus which may require further procedure such as laparoscopy. Time out was performed.  Urine pregnancy test negative.  Speculum placed in the vagina.  Cervix visualized.  Cleaned with Betadine x 2.  Grasped anteriorly with a single tooth tenaculum.  Uterus sounded to 6 cm.  Liletta IUD placed per manufacturer's recommendations.  Strings trimmed to 3 cm. Tenaculum was removed, good hemostasis noted.  Patient tolerated procedure well.   Patient was given post-procedure instructions.  She was advised to be have backup contraception for one week.  Patient was also asked to check IUD strings periodically and follow up in 4 weeks for IUD check.  Assessment:    Normal postpartum exam.  IUD insertion  Plan:   Essential components of care per ACOG recommendations:  1.  Mood and well being: Patient with negative  depression screening today. Reviewed local resources for support.  - Patient does not use tobacco.  - hx of drug use? No    2. Infant care and feeding:  -Patient currently breastmilk feeding? Yes If breastmilk feeding discussed return to work and pumping. If needed, patient was provided letter for work to allow for every 2-3 hr pumping breaks, and to be granted a private location to express breastmilk and refrigerated area to store breastmilk. Reviewed importance of draining breast regularly to support lactation. -Social determinants of health  (SDOH) reviewed in EPIC. No concerns  3. Sexuality, contraception and birth spacing - Patient does not want a pregnancy in the next year.  Desired family size is 3 children.  - Reviewed forms of contraception in tiered fashion. Patient desired IUD today.   - Discussed birth spacing of 18 months  4. Sleep and fatigue -Encouraged family/partner/community support of 4 hrs of uninterrupted sleep to help with mood and fatigue  5. Physical Recovery  - Discussed patients delivery and complications - Patient had no laceration, perineal healing reviewed. Patient expressed understanding - Patient has urinary incontinence? No  - Patient is not safe to resume physical and sexual activity. Discussed waiting until at least 6 weeks.   6.  Health Maintenance - Last pap smear done today   Vonzella Nipple, PA-C Center for [employer], [facility]

## 2020-02-17 ENCOUNTER — Ambulatory Visit (INDEPENDENT_AMBULATORY_CARE_PROVIDER_SITE_OTHER): Payer: Medicaid Other | Admitting: Plastic Surgery

## 2020-02-17 ENCOUNTER — Encounter: Payer: Self-pay | Admitting: Plastic Surgery

## 2020-02-17 VITALS — BP 98/57 | HR 77 | Temp 98.2°F | Ht 61.0 in | Wt 127.0 lb

## 2020-02-17 DIAGNOSIS — M654 Radial styloid tenosynovitis [de Quervain]: Secondary | ICD-10-CM | POA: Diagnosis not present

## 2020-02-17 DIAGNOSIS — M67432 Ganglion, left wrist: Secondary | ICD-10-CM | POA: Diagnosis not present

## 2020-02-17 LAB — CYTOLOGY - PAP: Diagnosis: NEGATIVE

## 2020-02-17 IMAGING — US US OB < 14 WEEKS - US OB TV
1 series · 15 of 28 positions shown · non-contrast
Comparison: None.

CLINICAL DATA: Severe cramps

EXAM:
OBSTETRIC <14 WK US AND TRANSVAGINAL OB US
TECHNIQUE: Both transabdominal and transvaginal ultrasound examinations were
performed for complete evaluation of the gestation as well as the
maternal uterus, adnexal regions, and pelvic cul-de-sac.
Transvaginal technique was performed to assess early pregnancy.

[Series 1: us ob < 14 weeks - us ob tv · 51 acquisitions, 15 frames shown]
[im 1/51]
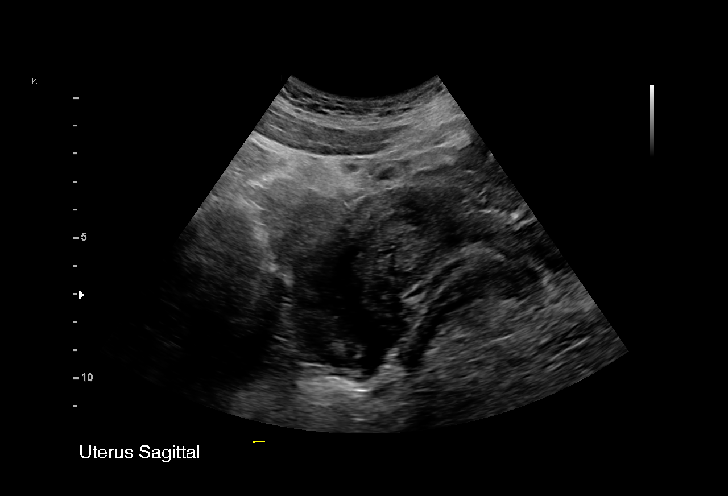
[im 4/51]
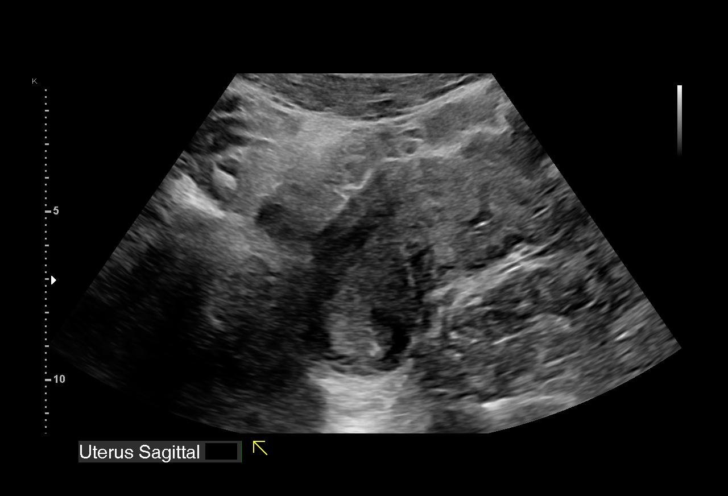
[im 8/51]
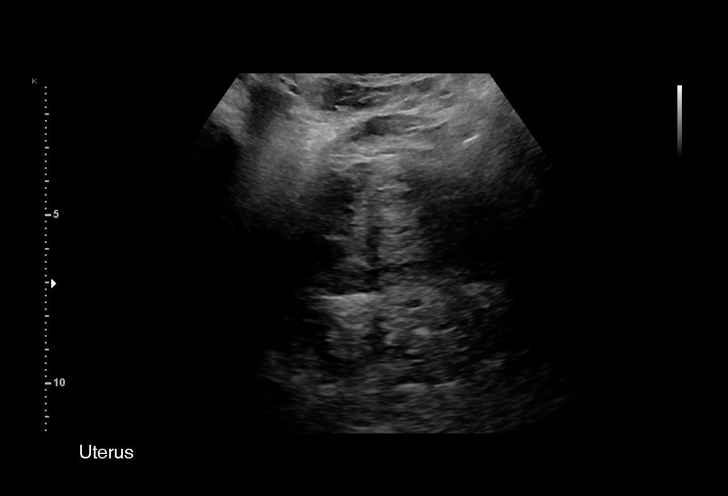
[im 12/51]
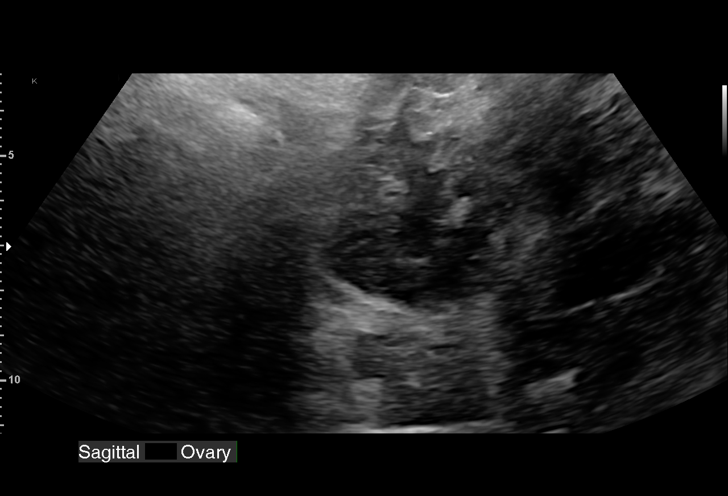
[im 15/51]
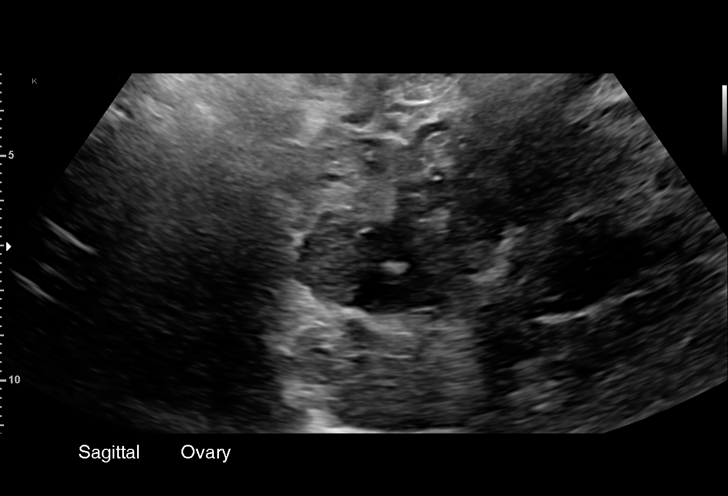
[im 19/51]
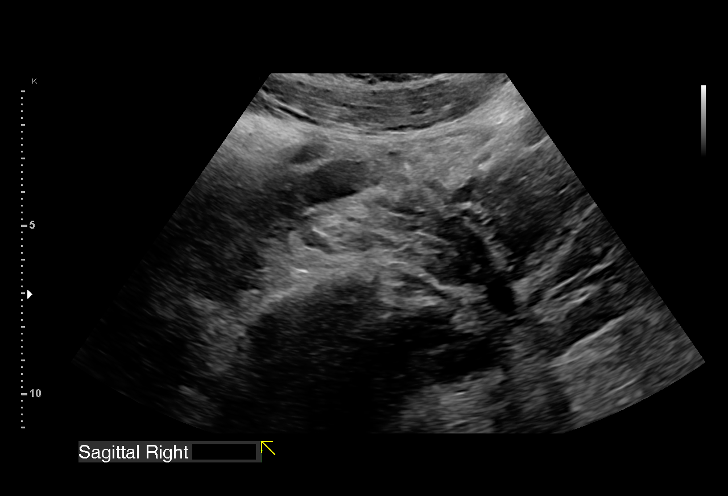
[im 23/51]
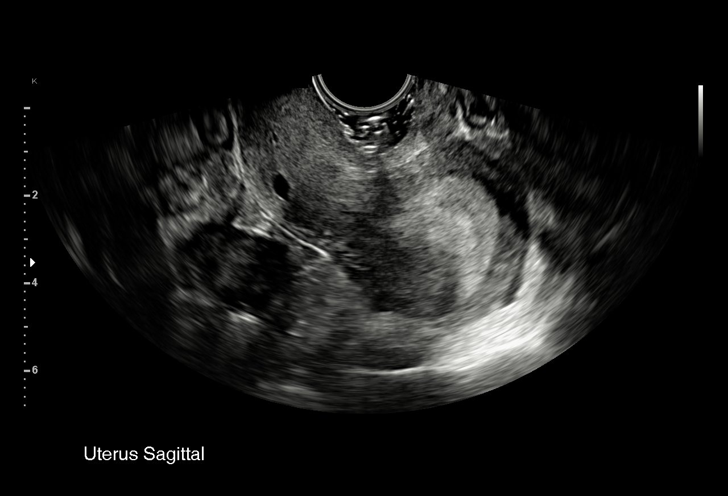
[im 26/51]
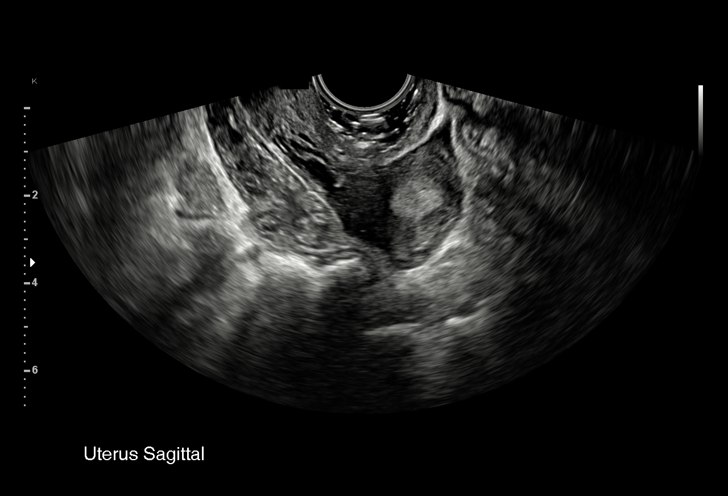
[im 28/51]
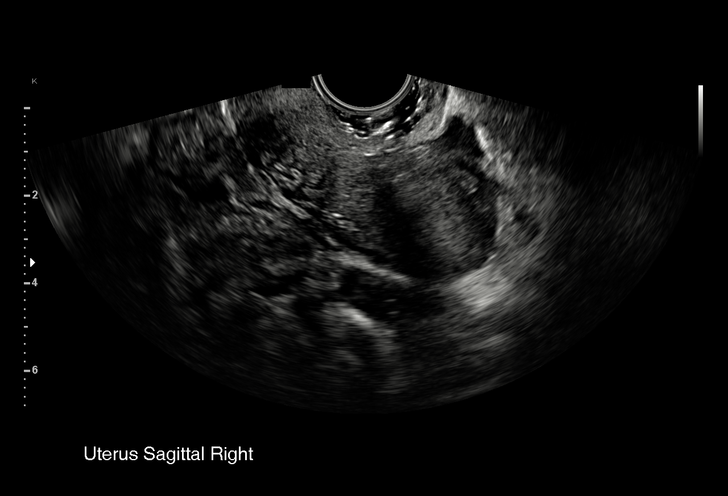
[im 32/51]
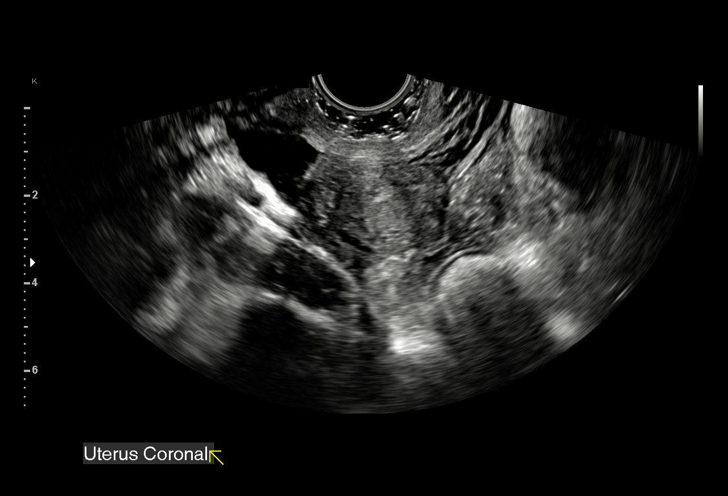
[im 36/51]
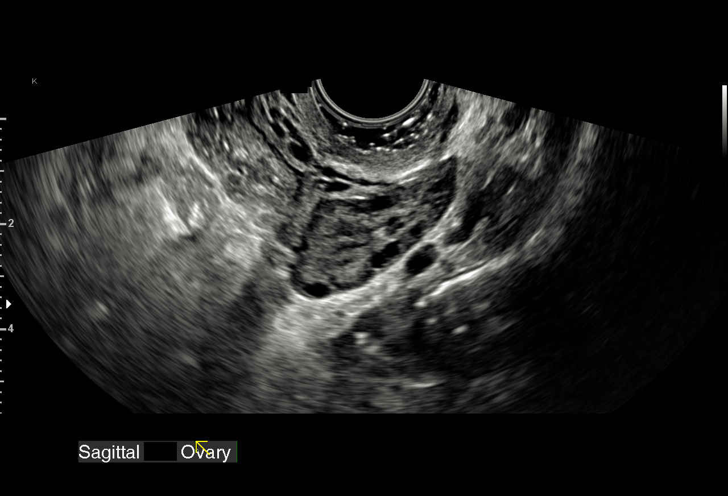
[im 39/51]
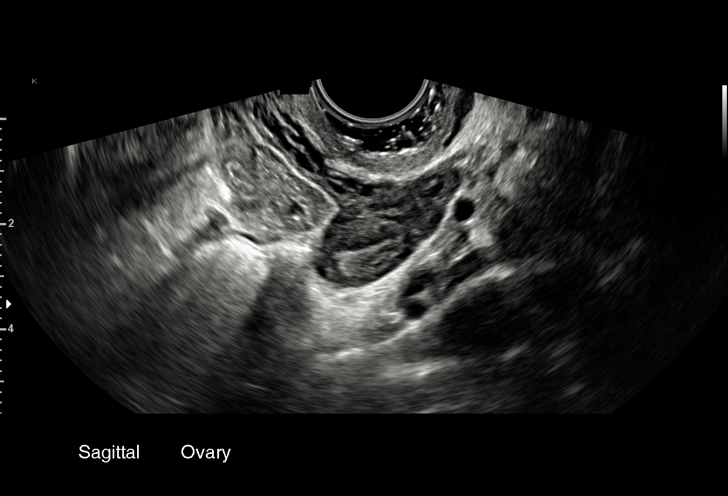
[im 43/51]
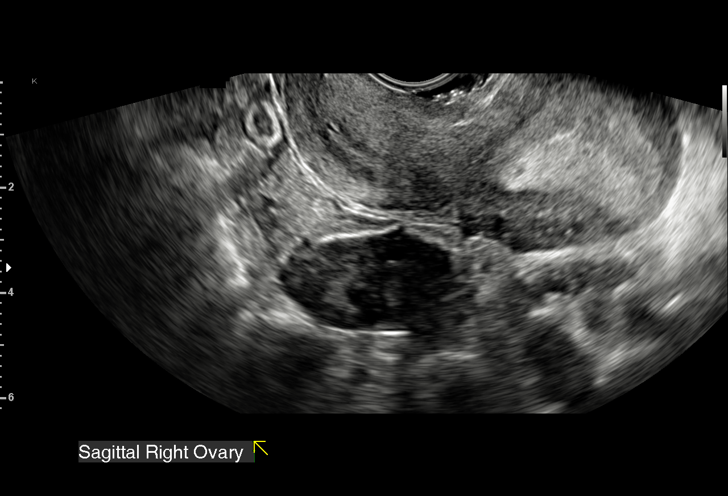
[im 47/51]
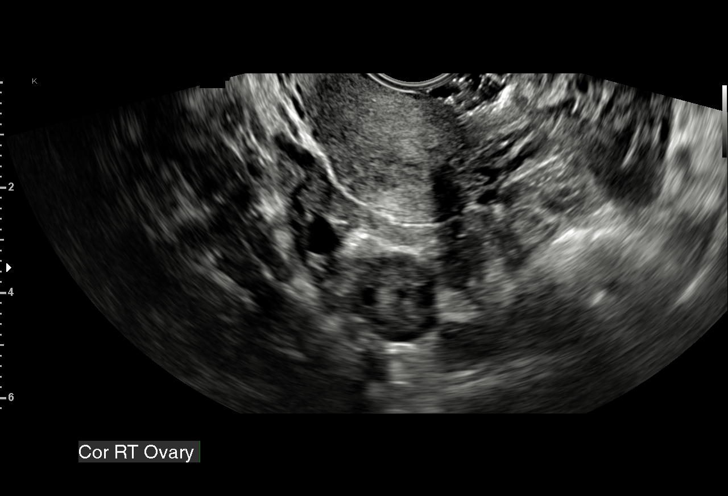
[im 51/51]
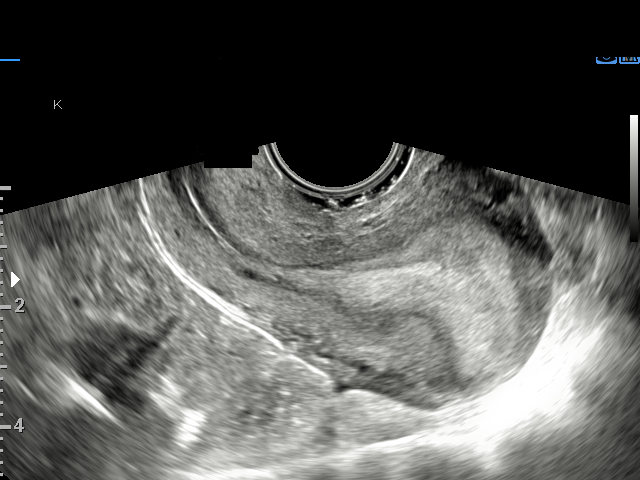

[15 of 28 positions shown; findings below may reference images not displayed]

FINDINGS: Intrauterine gestational sac: None

Yolk sac:  Not visualized

Embryo:  Not visualized

Cardiac Activity:

Heart Rate:   bpm

MSD:   mm    w     d

CRL:    mm    w    d                  US EDC:

Subchorionic hemorrhage:  N/A

Maternal uterus/adnexae: Endometrium 11 mm in thickness. No adnexal
mass or free fluid.
IMPRESSION: No intrauterine pregnancy visualized. Differential considerations
would include early intrauterine pregnancy too early to visualize,
spontaneous abortion, or occult ectopic pregnancy. Recommend close
clinical followup and serial quantitative beta HCGs and ultrasounds.

## 2020-02-17 NOTE — Progress Notes (Signed)
Referring Provider No referring provider defined for this encounter.   CC:  Chief Complaint  Patient presents with  . Consult      Rose Schwartz is an 19 y.o. female.  HPI: Patient presents to discuss a growing mass on her left wrist.  Is been present for at least a year.  Its been growing in size and is on the volar radial aspect.  She also has a new pain that is in the area of the radial styloid has been present for the last couple months.  She noticed it at the end of her pregnancy and has persisted.  Her son is now about a month old.  It has not been getting any better.  Allergies  Allergen Reactions  . Other Anaphylaxis and Shortness Of Breath    Tree nuts    Outpatient Encounter Medications as of 02/17/2020  Medication Sig Note  . calcium carbonate (TUMS - DOSED IN MG ELEMENTAL CALCIUM) 500 MG chewable tablet Chew 2 tablets by mouth 3 (three) times daily as needed for indigestion or heartburn.   . ferrous sulfate (FE-VITE IRON) 75 (15 Fe) MG/ML SOLN Take 1 mL (15 mg of iron total) by mouth 2 (two) times daily.   . Prenatal Vit-Fe Fumarate-FA (PREPLUS) 27-1 MG TABS Take 1 tablet by mouth daily.   Marland Kitchen senna (SENOKOT) 8.6 MG TABS tablet Take 1 tablet (8.6 mg total) by mouth at bedtime.   Marland Kitchen acetaminophen (TYLENOL) 325 MG tablet Take 2 tablets (650 mg total) by mouth every 4 (four) hours as needed (for pain scale < 4). (Patient not taking: Reported on 02/17/2020)   . albuterol (VENTOLIN HFA) 108 (90 Base) MCG/ACT inhaler Inhale 2 puffs into the lungs every 4 (four) hours as needed for wheezing or shortness of breath (cough, shortness of breath or wheezing.). (Patient not taking: Reported on 02/17/2020)   . Blood Pressure Monitoring (BLOOD PRESSURE KIT) DEVI 1 Device by Does not apply route as needed. (Patient not taking: Reported on 02/17/2020)   . EPINEPHrine 0.3 mg/0.3 mL IJ SOAJ injection Inject 0.3 mg into the muscle as needed for anaphylaxis.  (Patient not taking: Reported on  02/17/2020) 01/17/2020: On hand, if needed  . ibuprofen (ADVIL) 600 MG tablet Take 1 tablet (600 mg total) by mouth every 6 (six) hours. (Patient not taking: Reported on 02/17/2020)    No facility-administered encounter medications on file as of 02/17/2020.     Past Medical History:  Diagnosis Date  . Anemia   . Asthma   . Bipolar 1 disorder ([place])   . UTI (urinary tract infection)     Past Surgical History:  Procedure Laterality Date  . FETAL AMNIOTIC BAND RELEASE      Family History  Problem Relation Age of Onset  . Fibromyalgia Mother     Social History   Social History Narrative  . Not on file     Review of Systems General: Denies fevers, chills, weight loss CV: Denies chest pain, shortness of breath, palpitations  Physical Exam Vitals with BMI 02/17/2020 02/16/2020 01/19/2020  Height '5\' 1"'  '5\' 1"'  -  Weight 127 lbs 126 lbs -  BMI 56.70 14.10 -  Systolic 98 99 301  Diastolic 57 73 68  Pulse 77 96 86    General:  No acute distress,  Alert and oriented, Non-Toxic, Normal speech and affect Left hand: Fingers well-perfused with normal capillary refill and a palp radial pulse.  Sensation is intact throughout.  She has  full range of motion.  She has what looks like a volar ganglion cyst that has become prominent.  She is also tender over the radial styloid.  Finkelstein's test is positive.  Assessment/Plan Patient presents with a left wrist volar ganglion cyst and de Quervain's tenosynovitis.  We discussed surgical excision of the ganglion cyst and first dorsal extensor compartment release.  We discussed the risk that include bleeding, infection, damage surrounding structures, need for additional procedures.  We discussed the location of the incisions and the expected scars.  I discussed the expected recovery time.  All her questions were answered and we will plan to organize this for her.  Cindra Presume 02/17/2020, 4:44 PM

## 2020-02-22 ENCOUNTER — Encounter: Payer: Self-pay | Admitting: *Deleted

## 2020-02-22 NOTE — Progress Notes (Signed)
No show

## 2020-02-23 ENCOUNTER — Encounter: Payer: Self-pay | Admitting: Surgical

## 2020-02-23 ENCOUNTER — Ambulatory Visit (INDEPENDENT_AMBULATORY_CARE_PROVIDER_SITE_OTHER): Payer: Medicaid Other | Admitting: Surgical

## 2020-02-23 DIAGNOSIS — M654 Radial styloid tenosynovitis [de Quervain]: Secondary | ICD-10-CM

## 2020-02-23 DIAGNOSIS — M67432 Ganglion, left wrist: Secondary | ICD-10-CM

## 2020-03-02 ENCOUNTER — Encounter: Payer: Self-pay | Admitting: Surgical

## 2020-03-02 ENCOUNTER — Other Ambulatory Visit (HOSPITAL_COMMUNITY): Payer: Medicaid Other

## 2020-03-02 ENCOUNTER — Ambulatory Visit (INDEPENDENT_AMBULATORY_CARE_PROVIDER_SITE_OTHER): Payer: Medicaid Other | Admitting: Surgical

## 2020-03-02 ENCOUNTER — Other Ambulatory Visit: Payer: Self-pay

## 2020-03-02 VITALS — BP 107/71 | HR 83 | Temp 98.1°F | Ht 61.0 in | Wt 126.0 lb

## 2020-03-02 DIAGNOSIS — M67432 Ganglion, left wrist: Secondary | ICD-10-CM

## 2020-03-02 DIAGNOSIS — M654 Radial styloid tenosynovitis [de Quervain]: Secondary | ICD-10-CM

## 2020-03-02 NOTE — Progress Notes (Signed)
Patient ID: Rose Schwartz, female    DOB: 07-05-2001, 19 y.o.   MRN: [id number]  Chief Complaint  Patient presents with  . Pre-op Exam      ICD-10-CM   1. Ganglion cyst of volar aspect of left wrist  M67.432   2. De Quervain's disease (radial styloid tenosynovitis)  M65.4    History of Present Illness: Rose Schwartz is a 19 y.o.  female  with a history of ganglion on cyst of left wrist and de Quervain's tenosynovitis.  She presents for preoperative evaluation for upcoming procedure, left wrist ganglion cyst excision and left first dorsal extensor compartment release, scheduled for March 21, 2020 with Dr. 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  She does not recall any history of issues with anesthesia.  She reports her mother has a history of a blood clot and is currently being treated with this.  No other family history of blood clots.  No personal history of blood clots.  No family or personal history of bleeding or clotting disorders.  Patient reports she has been feeling well lately.  Non smoker.  Patient is not currently taking any blood thinners.  No history of CVA/MI. Patient has an IUD in place.  She does report when she was a very young child that she was evaluated for heart palpitations and potentially has a history of Wolff-Parkinson-White, she does not have any issues with her heart now per patient, denies any palpitations, shortness of breath, chest pain. She does have asthma, well controlled. Does not need to use her inhaler, no hospitalizations.  PMH Significant for: Asthma, bipolar 1 disorder.   Past Medical History: Allergies: Allergies  Allergen Reactions  . Other Anaphylaxis and Shortness Of Breath    Tree nuts    Current Medications:  Current Outpatient Medications:  .  Blood Pressure Monitoring (BLOOD PRESSURE KIT) DEVI, 1  Device by Does not apply route as needed., Disp: 1 each, Rfl: 0 .  calcium carbonate (TUMS - DOSED IN MG ELEMENTAL CALCIUM) 500 MG chewable tablet, Chew 2 tablets by mouth 3 (three) times daily as needed for indigestion or heartburn., Disp: , Rfl:  .  ferrous sulfate (FE-VITE IRON) 75 (15 Fe) MG/ML SOLN, Take 1 mL (15 mg of iron total) by mouth 2 (two) times daily., Disp: 100 mL, Rfl: 3 .  ibuprofen (ADVIL) 600 MG tablet, Take 1 tablet (600 mg total) by mouth every 6 (six) hours., Disp: 30 tablet, Rfl: 0 .  acetaminophen (TYLENOL) 325 MG tablet, Take 2 tablets (650 mg total) by mouth every 4 (four) hours as needed (for pain scale < 4). (Patient not taking: Reported on 03/02/2020), Disp: 30 tablet, Rfl: 0 .  albuterol (VENTOLIN HFA) 108 (90 Base) MCG/ACT inhaler, Inhale 2 puffs into the lungs every 4 (four) hours as needed for wheezing or shortness of breath (cough, shortness of breath or wheezing.). (Patient not taking: Reported on 03/02/2020), Disp: 1 Inhaler, Rfl: 1 .  EPINEPHrine 0.3 mg/0.3 mL IJ SOAJ injection, Inject 0.3 mg into the muscle as needed for anaphylaxis.  (Patient not taking: Reported on 03/02/2020), Disp: , Rfl:  .  Prenatal Vit-Fe Fumarate-FA (PREPLUS) 27-1 MG TABS, Take 1 tablet by mouth daily. (Patient not taking: Reported on 03/02/2020), Disp: 30 tablet, Rfl: 6 .  senna (SENOKOT) 8.6 MG TABS tablet, Take 1 tablet (8.6 mg  total) by mouth at bedtime. (Patient not taking: Reported on 03/02/2020), Disp: 30 tablet, Rfl: 0  Past Medical Problems: Past Medical History:  Diagnosis Date  . Anemia   . Asthma   . Bipolar 1 disorder ([place])   . UTI (urinary tract infection)     Past Surgical History: Past Surgical History:  Procedure Laterality Date  . FETAL AMNIOTIC BAND RELEASE      Social History: Social History   Socioeconomic History  . Marital status: Single    Spouse name: Not on file  . Number of children: Not on file  . Years of education: Not on file  . Highest education  level: Not on file  Occupational History  . Not on file  Tobacco Use  . Smoking status: Never Smoker  . Smokeless tobacco: Never Used  Vaping Use  . Vaping Use: Never used  Substance and Sexual Activity  . Alcohol use: Not Currently    Comment: SOCIAL  . Drug use: Not Currently    Types: Marijuana    Comment: few years ago  . Sexual activity: Yes    Birth control/protection: None  Other Topics Concern  . Not on file  Social History Narrative  . Not on file   Social Determinants of Health   Financial Resource Strain:   . Difficulty of Paying Living Expenses: Not on file  Food Insecurity: Food Insecurity Present  . Worried About Charity fundraiser in the Last Year: Sometimes true  . Ran Out of Food in the Last Year: Never true  Transportation Needs: No Transportation Needs  . Lack of Transportation (Medical): No  . Lack of Transportation (Non-Medical): No  Physical Activity:   . Days of Exercise per Week: Not on file  . Minutes of Exercise per Session: Not on file  Stress:   . Feeling of Stress : Not on file  Social Connections:   . Frequency of Communication with Friends and Family: Not on file  . Frequency of Social Gatherings with Friends and Family: Not on file  . Attends Religious Services: Not on file  . Active Member of Clubs or Organizations: Not on file  . Attends Archivist Meetings: Not on file  . Marital Status: Not on file  Intimate Partner Violence:   . Fear of Current or Ex-Partner: Not on file  . Emotionally Abused: Not on file  . Physically Abused: Not on file  . Sexually Abused: Not on file    Family History: Family History  Problem Relation Age of Onset  . Fibromyalgia Mother     Review of Systems: Review of Systems  Constitutional: Negative.   Respiratory: Negative.   Cardiovascular: Negative.   Gastrointestinal: Negative.   Musculoskeletal: Positive for myalgias (left hand).    Physical Exam: Vital Signs BP 107/71 (BP  Location: Left Arm, Patient Position: Sitting, Cuff Size: Normal)   Pulse 83   Temp 98.1 F (36.7 C) (Oral)   Ht _0  (1.549 m)   Wt 126 lb (57.2 kg)   SpO2 98%   BMI 23.81 kg/m  Physical Exam Constitutional:      General: She is not in acute distress.    Appearance: Normal appearance. She is not ill-appearing.  HENT:     Head: Normocephalic and atraumatic.  Eyes:     Pupils: Pupils are equal, round Neck:     Musculoskeletal: Normal range of motion.  Cardiovascular:     Rate and Rhythm: Normal rate and regular rhythm.  Pulses: Normal pulses.     Heart sounds: Normal heart sounds. No murmur.  Pulmonary:     Effort: Pulmonary effort is normal. No respiratory distress.     Breath sounds: Normal breath sounds. No wheezing.  Abdominal:     General: Abdomen is flat. There is no distension.     Palpations: Abdomen is soft.     Tenderness: There is no abdominal tenderness.  Musculoskeletal: Normal range of motion. Pain with ROM/Flexion of left thumb.  Ganglion cyst noted over left volar wrist. Skin:    General: Skin is warm and dry.     Findings: No erythema or rash.  Neurological:     General: No focal deficit present.     Mental Status: She is alert and oriented to person, place, and time. Mental status is at baseline.     Motor: No weakness.  Psychiatric:        Mood and Affect: Mood normal.        Behavior: Behavior normal.     Assessment/Plan: Patient is scheduled for left wrist ganglion cyst excision and left first dorsal extensor compartment release with Dr. Claudia Desanctis.  Risks, benefits, and alternatives of procedure discussed, questions answered and consent obtained.    Smoking Status: non smoker; Counseling Given? NA  Caprini Score: high; Risk Factors include: IUD in place Sharmaine Base), fmhx of thrombosis. Recommendation for mechanical prophylaxis during surgery. Encourage early ambulation. She is going to be ~ 8 weeks postpartum.  Post-op Rx sent to pharmacy: None.  Patient will use APAP/Ibuprofen for post-op pain control as she is breast feeding.  Patient was provided with the General Surgical Risk consent document and Pain Medication Agreement prior to their appointment.  They had adequate time to read through the risk consent documents and Pain Medication Agreement. We also discussed them in person together during this preop appointment. All of their questions were answered to their satisfaction.  Recommended calling if they have any further questions.  Risk consent form and Pain Medication Agreement to be scanned into patient's chart.   Electronically signed by: Carola Rhine Sherie Dobrowolski, PA-C 03/02/2020 2:21 PM

## 2020-03-02 NOTE — H&P (View-Only) (Signed)
Patient ID: Rose Schwartz, female    DOB: 07-05-2001, 19 y.o.   MRN: [id number]  Chief Complaint  Patient presents with  . Pre-op Exam      ICD-10-CM   1. Ganglion cyst of volar aspect of left wrist  M67.432   2. De Quervain's disease (radial styloid tenosynovitis)  M65.4    History of Present Illness: Rose Schwartz is a 19 y.o.  female  with a history of ganglion on cyst of left wrist and de Quervain's tenosynovitis.  She presents for preoperative evaluation for upcoming procedure, left wrist ganglion cyst excision and left first dorsal extensor compartment release, scheduled for March 21, 2020 with Dr. 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  She does not recall any history of issues with anesthesia.  She reports her mother has a history of a blood clot and is currently being treated with this.  No other family history of blood clots.  No personal history of blood clots.  No family or personal history of bleeding or clotting disorders.  Patient reports she has been feeling well lately.  Non smoker.  Patient is not currently taking any blood thinners.  No history of CVA/MI. Patient has an IUD in place.  She does report when she was a very young child that she was evaluated for heart palpitations and potentially has a history of Wolff-Parkinson-White, she does not have any issues with her heart now per patient, denies any palpitations, shortness of breath, chest pain. She does have asthma, well controlled. Does not need to use her inhaler, no hospitalizations.  PMH Significant for: Asthma, bipolar 1 disorder.   Past Medical History: Allergies: Allergies  Allergen Reactions  . Other Anaphylaxis and Shortness Of Breath    Tree nuts    Current Medications:  Current Outpatient Medications:  .  Blood Pressure Monitoring (BLOOD PRESSURE KIT) DEVI, 1  Device by Does not apply route as needed., Disp: 1 each, Rfl: 0 .  calcium carbonate (TUMS - DOSED IN MG ELEMENTAL CALCIUM) 500 MG chewable tablet, Chew 2 tablets by mouth 3 (three) times daily as needed for indigestion or heartburn., Disp: , Rfl:  .  ferrous sulfate (FE-VITE IRON) 75 (15 Fe) MG/ML SOLN, Take 1 mL (15 mg of iron total) by mouth 2 (two) times daily., Disp: 100 mL, Rfl: 3 .  ibuprofen (ADVIL) 600 MG tablet, Take 1 tablet (600 mg total) by mouth every 6 (six) hours., Disp: 30 tablet, Rfl: 0 .  acetaminophen (TYLENOL) 325 MG tablet, Take 2 tablets (650 mg total) by mouth every 4 (four) hours as needed (for pain scale < 4). (Patient not taking: Reported on 03/02/2020), Disp: 30 tablet, Rfl: 0 .  albuterol (VENTOLIN HFA) 108 (90 Base) MCG/ACT inhaler, Inhale 2 puffs into the lungs every 4 (four) hours as needed for wheezing or shortness of breath (cough, shortness of breath or wheezing.). (Patient not taking: Reported on 03/02/2020), Disp: 1 Inhaler, Rfl: 1 .  EPINEPHrine 0.3 mg/0.3 mL IJ SOAJ injection, Inject 0.3 mg into the muscle as needed for anaphylaxis.  (Patient not taking: Reported on 03/02/2020), Disp: , Rfl:  .  Prenatal Vit-Fe Fumarate-FA (PREPLUS) 27-1 MG TABS, Take 1 tablet by mouth daily. (Patient not taking: Reported on 03/02/2020), Disp: 30 tablet, Rfl: 6 .  senna (SENOKOT) 8.6 MG TABS tablet, Take 1 tablet (8.6 mg  total) by mouth at bedtime. (Patient not taking: Reported on 03/02/2020), Disp: 30 tablet, Rfl: 0  Past Medical Problems: Past Medical History:  Diagnosis Date  . Anemia   . Asthma   . Bipolar 1 disorder ([facility])   . UTI (urinary tract infection)     Past Surgical History: Past Surgical History:  Procedure Laterality Date  . FETAL AMNIOTIC BAND RELEASE      Social History: Social History   Socioeconomic History  . Marital status: Single    Spouse name: Not on file  . Number of children: Not on file  . Years of education: Not on file  . Highest education  level: Not on file  Occupational History  . Not on file  Tobacco Use  . Smoking status: Never Smoker  . Smokeless tobacco: Never Used  Vaping Use  . Vaping Use: Never used  Substance and Sexual Activity  . Alcohol use: Not Currently    Comment: SOCIAL  . Drug use: Not Currently    Types: Marijuana    Comment: few years ago  . Sexual activity: Yes    Birth control/protection: None  Other Topics Concern  . Not on file  Social History Narrative  . Not on file   Social Determinants of Health   Financial Resource Strain:   . Difficulty of Paying Living Expenses: Not on file  Food Insecurity: Food Insecurity Present  . Worried About Running Out of Food in the Last Year: Sometimes true  . Ran Out of Food in the Last Year: Never true  Transportation Needs: No Transportation Needs  . Lack of Transportation (Medical): No  . Lack of Transportation (Non-Medical): No  Physical Activity:   . Days of Exercise per Week: Not on file  . Minutes of Exercise per Session: Not on file  Stress:   . Feeling of Stress : Not on file  Social Connections:   . Frequency of Communication with Friends and Family: Not on file  . Frequency of Social Gatherings with Friends and Family: Not on file  . Attends Religious Services: Not on file  . Active Member of Clubs or Organizations: Not on file  . Attends Club or Organization Meetings: Not on file  . Marital Status: Not on file  Intimate Partner Violence:   . Fear of Current or Ex-Partner: Not on file  . Emotionally Abused: Not on file  . Physically Abused: Not on file  . Sexually Abused: Not on file    Family History: Family History  Problem Relation Age of Onset  . Fibromyalgia Mother     Review of Systems: Review of Systems  Constitutional: Negative.   Respiratory: Negative.   Cardiovascular: Negative.   Gastrointestinal: Negative.   Musculoskeletal: Positive for myalgias (left hand).    Physical Exam: Vital Signs BP 107/71 (BP  Location: Left Arm, Patient Position: Sitting, Cuff Size: Normal)   Pulse 83   Temp 98.1 F (36.7 C) (Oral)   Ht 5' 1" (1.549 m)   Wt 126 lb (57.2 kg)   SpO2 98%   BMI 23.81 kg/m  Physical Exam Constitutional:      General: She is not in acute distress.    Appearance: Normal appearance. She is not ill-appearing.  HENT:     Head: Normocephalic and atraumatic.  Eyes:     Pupils: Pupils are equal, round Neck:     Musculoskeletal: Normal range of motion.  Cardiovascular:     Rate and Rhythm: Normal rate and regular rhythm.       Pulses: Normal pulses.     Heart sounds: Normal heart sounds. No murmur.  Pulmonary:     Effort: Pulmonary effort is normal. No respiratory distress.     Breath sounds: Normal breath sounds. No wheezing.  Abdominal:     General: Abdomen is flat. There is no distension.     Palpations: Abdomen is soft.     Tenderness: There is no abdominal tenderness.  Musculoskeletal: Normal range of motion. Pain with ROM/Flexion of left thumb.  Ganglion cyst noted over left volar wrist. Skin:    General: Skin is warm and dry.     Findings: No erythema or rash.  Neurological:     General: No focal deficit present.     Mental Status: She is alert and oriented to person, place, and time. Mental status is at baseline.     Motor: No weakness.  Psychiatric:        Mood and Affect: Mood normal.        Behavior: Behavior normal.     Assessment/Plan: Patient is scheduled for left wrist ganglion cyst excision and left first dorsal extensor compartment release with Dr. Claudia Schwartz.  Risks, benefits, and alternatives of procedure discussed, questions answered and consent obtained.    Smoking Status: non smoker; Counseling Given? NA  Caprini Score: high; Risk Factors include: IUD in place Sharmaine Base), fmhx of thrombosis. Recommendation for mechanical prophylaxis during surgery. Encourage early ambulation. She is going to be ~ 8 weeks postpartum.  Post-op Rx sent to pharmacy: None.  Patient will use APAP/Ibuprofen for post-op pain control as she is breast feeding.  Patient was provided with the General Surgical Risk consent document and Pain Medication Agreement prior to their appointment.  They had adequate time to read through the risk consent documents and Pain Medication Agreement. We also discussed them in person together during this preop appointment. All of their questions were answered to their satisfaction.  Recommended calling if they have any further questions.  Risk consent form and Pain Medication Agreement to be scanned into patient's chart.   Electronically signed by: Carola Rhine Rollande Thursby, PA-C 03/02/2020 2:21 PM

## 2020-03-05 IMAGING — US US OB TRANSVAGINAL
1 series · 15 of 28 positions shown · non-contrast
Comparison: 05/24/2019

CLINICAL DATA: First trimester pregnancy, pregnancy of unknown
anatomic location, confirm location, viability and dating

EXAM:
TRANSVAGINAL OB ULTRASOUND
TECHNIQUE: Transvaginal ultrasound was performed for complete evaluation of the
gestation as well as the maternal uterus, adnexal regions, and
pelvic cul-de-sac.

[Series 1: us ob transvaginal · 41 acquisitions, 15 frames shown]
[im 1/41]
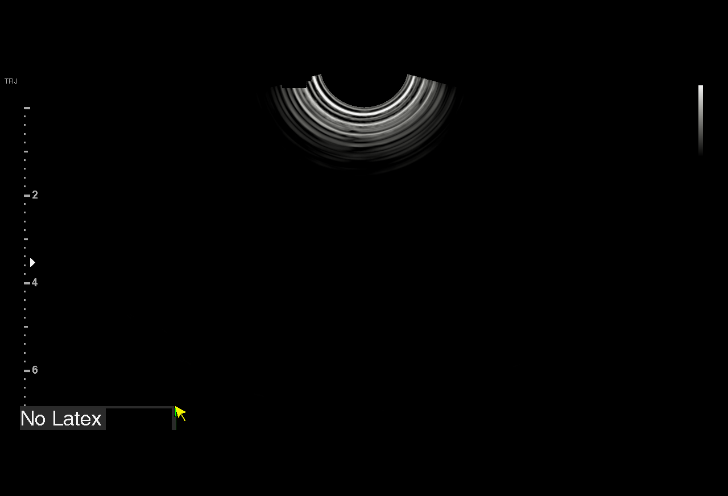
[im 3/41]
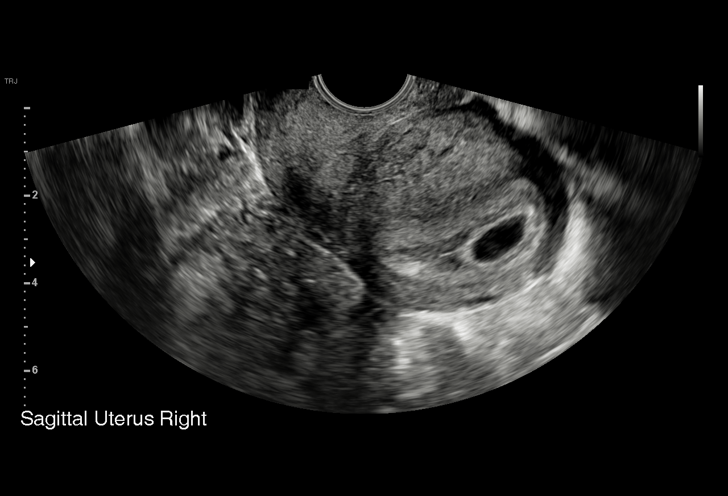
[im 6/41]
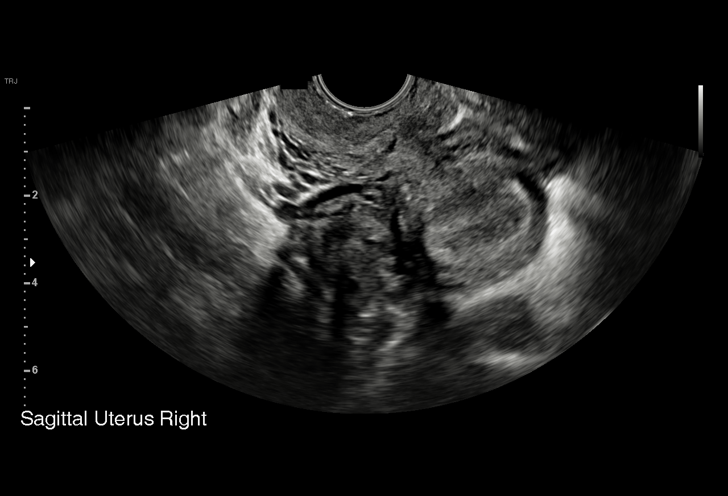
[im 9/41]
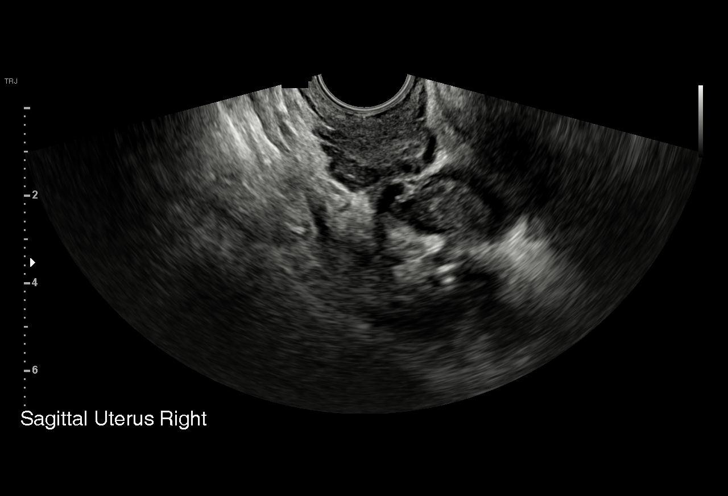
[im 12/41]
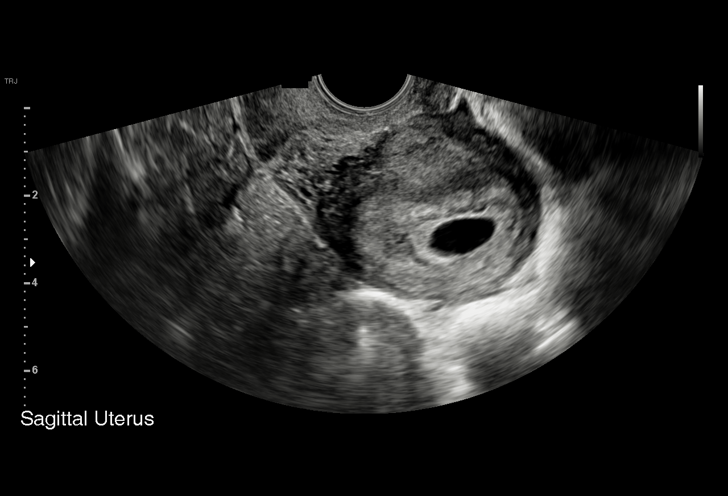
[im 15/41]
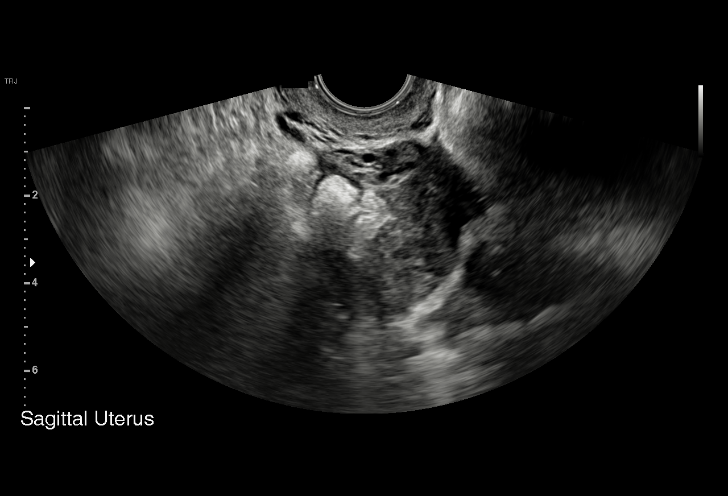
[im 18/41]
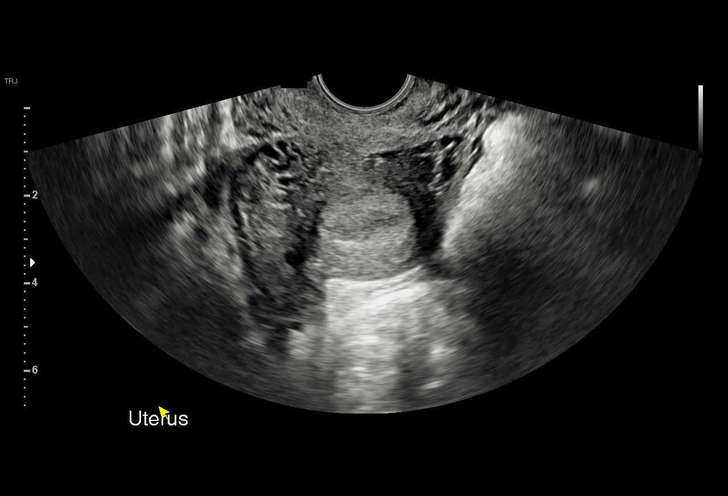
[im 21/41]
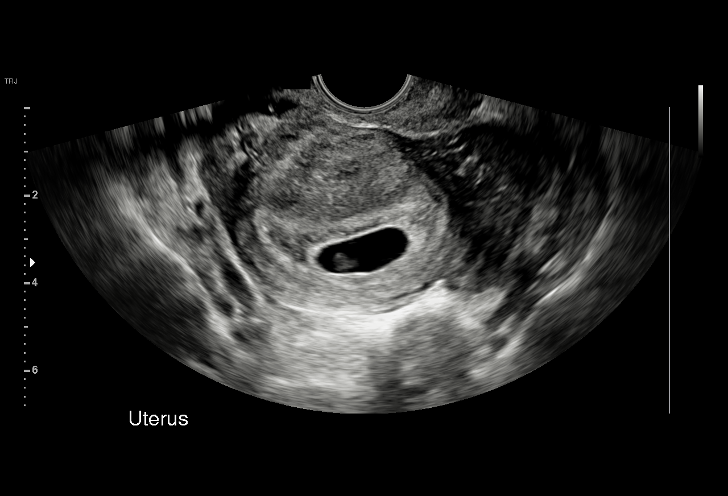
[im 23/41]
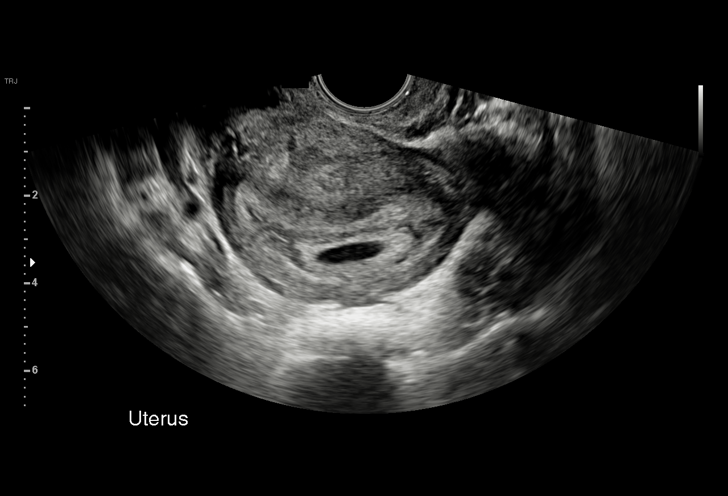
[im 26/41]
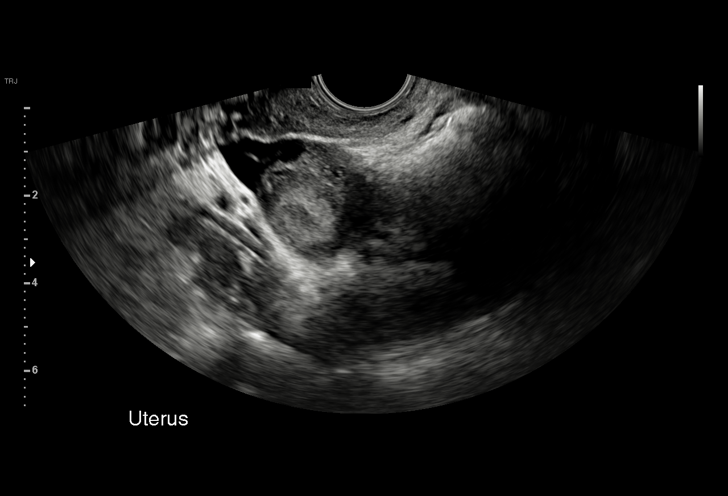
[im 29/41]
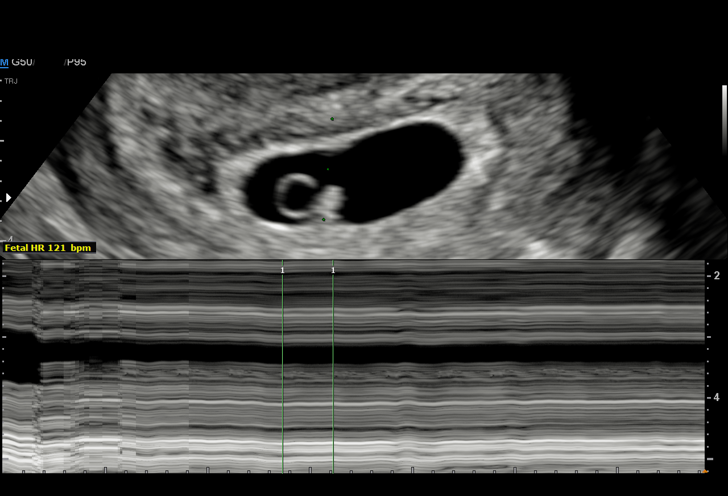
[im 32/41]
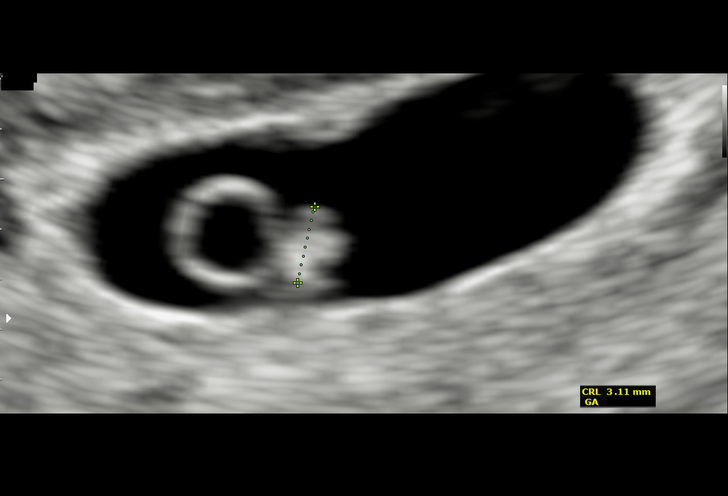
[im 35/41]
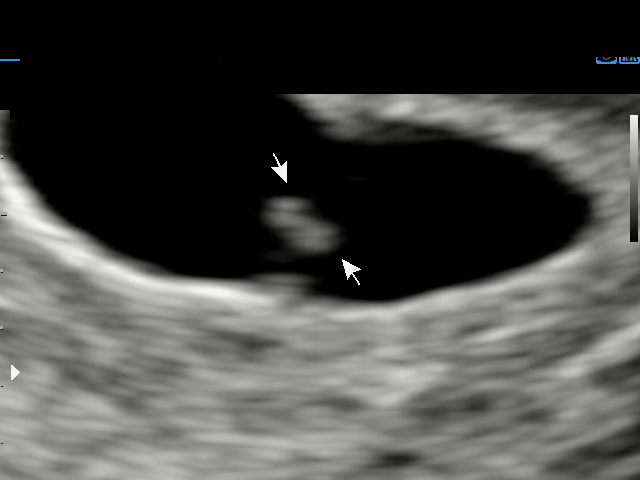
[im 38/41]
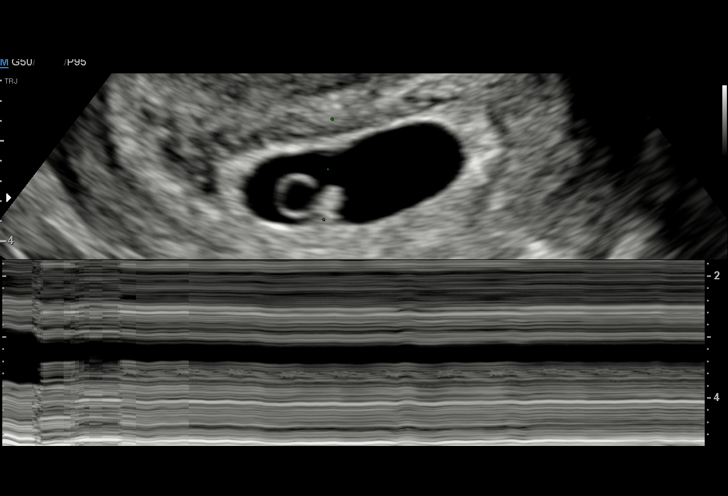
[im 41/41]
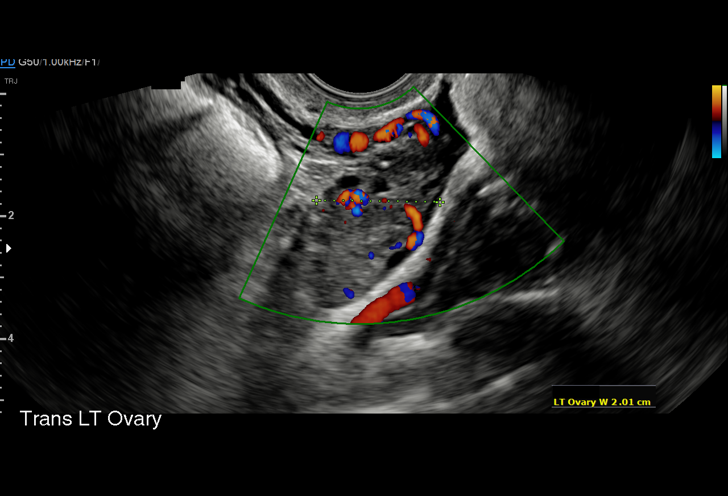

[15 of 28 positions shown; findings below may reference images not displayed]

FINDINGS: Intrauterine gestational sac: Present, single

Yolk sac:  Present

Embryo:  Present

Cardiac Activity: Present

Heart Rate: 121 bpm

CRL:   2.9 mm   5 w 5 d                  US EDC: 02/05/2020

Subchorionic hemorrhage:  None visualized.

Maternal uterus/adnexae:

RIGHT ovary normal size and morphology, 4.4 x 1.6 x 1.6 cm.

LEFT ovary normal size and morphology, 2.9 x 1.5 x 2.0 cm.

Trace free pelvic fluid.

No adnexal masses.
IMPRESSION: Single live intrauterine gestation at 5 weeks 5 days EGA by
crown-rump length.

No acute abnormalities.

## 2020-03-06 DIAGNOSIS — J301 Allergic rhinitis due to pollen: Secondary | ICD-10-CM | POA: Diagnosis not present

## 2020-03-06 DIAGNOSIS — Z91018 Allergy to other foods: Secondary | ICD-10-CM | POA: Diagnosis not present

## 2020-03-06 DIAGNOSIS — J3089 Other allergic rhinitis: Secondary | ICD-10-CM | POA: Diagnosis not present

## 2020-03-06 DIAGNOSIS — J3081 Allergic rhinitis due to animal (cat) (dog) hair and dander: Secondary | ICD-10-CM | POA: Diagnosis not present

## 2020-03-14 ENCOUNTER — Encounter (HOSPITAL_BASED_OUTPATIENT_CLINIC_OR_DEPARTMENT_OTHER): Payer: Self-pay | Admitting: Plastic Surgery

## 2020-03-14 ENCOUNTER — Other Ambulatory Visit: Payer: Self-pay

## 2020-03-15 ENCOUNTER — Encounter: Payer: Medicaid Other | Admitting: Plastic Surgery

## 2020-03-16 ENCOUNTER — Other Ambulatory Visit (HOSPITAL_COMMUNITY): Payer: Medicaid Other

## 2020-03-17 ENCOUNTER — Other Ambulatory Visit (HOSPITAL_COMMUNITY)
Admission: RE | Admit: 2020-03-17 | Discharge: 2020-03-17 | Disposition: A | Discharge status: Home / Self Care | Payer: Medicaid Other | Source: Ambulatory Visit | Attending: Plastic Surgery | Admitting: Plastic Surgery

## 2020-03-17 DIAGNOSIS — Z01812 Encounter for preprocedural laboratory examination: Secondary | ICD-10-CM | POA: Diagnosis not present

## 2020-03-17 DIAGNOSIS — Z20822 Contact with and (suspected) exposure to covid-19: Secondary | ICD-10-CM | POA: Diagnosis not present

## 2020-03-17 LAB — SARS CORONAVIRUS 2 (TAT 6-24 HRS): SARS Coronavirus 2: NEGATIVE

## 2020-03-21 ENCOUNTER — Ambulatory Visit (HOSPITAL_BASED_OUTPATIENT_CLINIC_OR_DEPARTMENT_OTHER)
Admission: RE | Admit: 2020-03-21 | Discharge: 2020-03-21 | Disposition: A | Discharge status: Home / Self Care | Payer: Medicaid Other | Attending: Plastic Surgery | Admitting: Plastic Surgery

## 2020-03-21 ENCOUNTER — Ambulatory Visit (HOSPITAL_BASED_OUTPATIENT_CLINIC_OR_DEPARTMENT_OTHER): Payer: Medicaid Other | Admitting: Certified Registered"

## 2020-03-21 ENCOUNTER — Other Ambulatory Visit: Payer: Self-pay

## 2020-03-21 ENCOUNTER — Encounter (HOSPITAL_BASED_OUTPATIENT_CLINIC_OR_DEPARTMENT_OTHER)
Admission: RE | Disposition: A | Discharge status: Home / Self Care | Payer: Self-pay | Source: Home / Self Care | Attending: Plastic Surgery

## 2020-03-21 ENCOUNTER — Encounter (HOSPITAL_BASED_OUTPATIENT_CLINIC_OR_DEPARTMENT_OTHER): Payer: Self-pay | Admitting: Plastic Surgery

## 2020-03-21 DIAGNOSIS — M654 Radial styloid tenosynovitis [de Quervain]: Secondary | ICD-10-CM | POA: Diagnosis not present

## 2020-03-21 DIAGNOSIS — M67432 Ganglion, left wrist: Secondary | ICD-10-CM | POA: Insufficient documentation

## 2020-03-21 DIAGNOSIS — Z791 Long term (current) use of non-steroidal anti-inflammatories (NSAID): Secondary | ICD-10-CM | POA: Insufficient documentation

## 2020-03-21 DIAGNOSIS — F319 Bipolar disorder, unspecified: Secondary | ICD-10-CM | POA: Insufficient documentation

## 2020-03-21 DIAGNOSIS — D649 Anemia, unspecified: Secondary | ICD-10-CM | POA: Diagnosis not present

## 2020-03-21 DIAGNOSIS — J45909 Unspecified asthma, uncomplicated: Secondary | ICD-10-CM | POA: Insufficient documentation

## 2020-03-21 HISTORY — PX: CYST EXCISION: SHX5701

## 2020-03-21 HISTORY — PX: TRIGGER FINGER RELEASE: SHX641

## 2020-03-21 LAB — POCT PREGNANCY, URINE: Preg Test, Ur: NEGATIVE

## 2020-03-21 SURGERY — CYST REMOVAL
Anesthesia: General | Site: Wrist | Laterality: Left

## 2020-03-21 MED ORDER — MIDAZOLAM HCL 5 MG/5ML IJ SOLN
INTRAMUSCULAR | Status: DC | PRN
  Administered 2020-03-21: 2 mg via INTRAVENOUS

## 2020-03-21 MED ORDER — LIDOCAINE-EPINEPHRINE 1 %-1:100000 IJ SOLN
INTRAMUSCULAR | Status: DC | PRN
  Administered 2020-03-21: 10 mL via INTRADERMAL

## 2020-03-21 MED ORDER — PROMETHAZINE HCL 25 MG/ML IJ SOLN: 6.2500 mg | INTRAMUSCULAR | Status: DC | PRN

## 2020-03-21 MED ORDER — FENTANYL CITRATE (PF) 100 MCG/2ML IJ SOLN
INTRAMUSCULAR | Status: AC
  Filled 2020-03-21: qty 2

## 2020-03-21 MED ORDER — MIDAZOLAM HCL 2 MG/2ML IJ SOLN
INTRAMUSCULAR | Status: AC
  Filled 2020-03-21: qty 2

## 2020-03-21 MED ORDER — LIDOCAINE HCL (CARDIAC) PF 100 MG/5ML IV SOSY
PREFILLED_SYRINGE | INTRAVENOUS | Status: DC | PRN
  Administered 2020-03-21: 60 mg via INTRAVENOUS

## 2020-03-21 MED ORDER — LACTATED RINGERS IV SOLN: INTRAVENOUS | Status: DC

## 2020-03-21 MED ORDER — BUPIVACAINE HCL (PF) 0.25 % IJ SOLN
INTRAMUSCULAR | Status: AC
  Filled 2020-03-21: qty 30

## 2020-03-21 MED ORDER — PROPOFOL 10 MG/ML IV BOLUS
INTRAVENOUS | Status: DC | PRN
  Administered 2020-03-21: 200 mg via INTRAVENOUS

## 2020-03-21 MED ORDER — HYDROMORPHONE HCL 1 MG/ML IJ SOLN: 0.2500 mg | INTRAMUSCULAR | Status: DC | PRN

## 2020-03-21 MED ORDER — LIDOCAINE HCL (PF) 1 % IJ SOLN
INTRAMUSCULAR | Status: AC
  Filled 2020-03-21: qty 30

## 2020-03-21 MED ORDER — DEXAMETHASONE SODIUM PHOSPHATE 10 MG/ML IJ SOLN
INTRAMUSCULAR | Status: DC | PRN
  Administered 2020-03-21: 4 mg via INTRAVENOUS

## 2020-03-21 MED ORDER — CEFAZOLIN SODIUM-DEXTROSE 2-4 GM/100ML-% IV SOLN: 2.0000 g | INTRAVENOUS | Status: DC

## 2020-03-21 MED ORDER — EPINEPHRINE PF 1 MG/ML IJ SOLN
INTRAMUSCULAR | Status: AC
  Filled 2020-03-21: qty 1

## 2020-03-21 MED ORDER — MEPERIDINE HCL 25 MG/ML IJ SOLN: 6.2500 mg | INTRAMUSCULAR | Status: DC | PRN

## 2020-03-21 MED ORDER — FENTANYL CITRATE (PF) 100 MCG/2ML IJ SOLN
INTRAMUSCULAR | Status: DC | PRN
Start: 2020-03-21 — End: 2020-03-21
  Administered 2020-03-21: 50 ug via INTRAVENOUS
  Administered 2020-03-21: 25 ug via INTRAVENOUS
  Administered 2020-03-21: 50 ug via INTRAVENOUS
  Administered 2020-03-21: 25 ug via INTRAVENOUS
  Administered 2020-03-21: 50 ug via INTRAVENOUS

## 2020-03-21 MED ORDER — CEFAZOLIN SODIUM-DEXTROSE 2-4 GM/100ML-% IV SOLN
INTRAVENOUS | Status: AC
  Filled 2020-03-21: qty 100

## 2020-03-21 MED ORDER — OXYCODONE HCL 5 MG PO TABS: 5.0000 mg | ORAL_TABLET | Freq: Once | ORAL | Status: DC | PRN

## 2020-03-21 MED ORDER — PROPOFOL 500 MG/50ML IV EMUL
INTRAVENOUS | Status: DC | PRN
  Administered 2020-03-21: 25 ug/kg/min via INTRAVENOUS

## 2020-03-21 MED ORDER — ONDANSETRON HCL 4 MG/2ML IJ SOLN
INTRAMUSCULAR | Status: DC | PRN
  Administered 2020-03-21: 4 mg via INTRAVENOUS

## 2020-03-21 MED ORDER — AMISULPRIDE (ANTIEMETIC) 5 MG/2ML IV SOLN: 10.0000 mg | Freq: Once | INTRAVENOUS | Status: DC | PRN

## 2020-03-21 MED ORDER — OXYCODONE HCL 5 MG/5ML PO SOLN: 5.0000 mg | Freq: Once | ORAL | Status: DC | PRN

## 2020-03-21 SURGICAL SUPPLY — 42 items
APL PRP STRL LF DISP 70% ISPRP (MISCELLANEOUS) ×1
APL SKNCLS STERI-STRIP NONHPOA (GAUZE/BANDAGES/DRESSINGS) ×1
BENZOIN TINCTURE PRP APPL 2/3 (GAUZE/BANDAGES/DRESSINGS) ×2 IMPLANT
BLADE MINI RND TIP GREEN BEAV (BLADE) IMPLANT
BLADE SURG 15 STRL LF DISP TIS (BLADE) ×1 IMPLANT
BLADE SURG 15 STRL SS (BLADE) ×2
BNDG CMPR 9X4 STRL LF SNTH (GAUZE/BANDAGES/DRESSINGS)
BNDG COHESIVE 1X5 TAN STRL LF (GAUZE/BANDAGES/DRESSINGS) IMPLANT
BNDG COHESIVE 2X5 TAN STRL LF (GAUZE/BANDAGES/DRESSINGS) IMPLANT
BNDG COHESIVE 3X5 TAN STRL LF (GAUZE/BANDAGES/DRESSINGS) IMPLANT
BNDG ELASTIC 3X5.8 VLCR STR LF (GAUZE/BANDAGES/DRESSINGS) ×2 IMPLANT
BNDG ESMARK 4X9 LF (GAUZE/BANDAGES/DRESSINGS) IMPLANT
BNDG GAUZE ELAST 4 BULKY (GAUZE/BANDAGES/DRESSINGS) ×2 IMPLANT
CHLORAPREP W/TINT 26 (MISCELLANEOUS) ×2 IMPLANT
CLSR STERI-STRIP ANTIMIC 1/2X4 (GAUZE/BANDAGES/DRESSINGS) ×2 IMPLANT
CORD BIPOLAR FORCEPS 12FT (ELECTRODE) ×2 IMPLANT
COVER BACK TABLE 60X90IN (DRAPES) ×2 IMPLANT
COVER MAYO STAND STRL (DRAPES) ×2 IMPLANT
COVER WAND RF STERILE (DRAPES) IMPLANT
CUFF TOURN SGL QUICK 18X4 (TOURNIQUET CUFF) IMPLANT
DRAPE EXTREMITY T 121X128X90 (DISPOSABLE) ×2 IMPLANT
DRAPE SURG 17X23 STRL (DRAPES) ×2 IMPLANT
GAUZE SPONGE 4X4 12PLY STRL (GAUZE/BANDAGES/DRESSINGS) ×2 IMPLANT
GAUZE XEROFORM 1X8 LF (GAUZE/BANDAGES/DRESSINGS) ×2 IMPLANT
GLOVE BIOGEL M STRL SZ7.5 (GLOVE) ×2 IMPLANT
GLOVE BIOGEL PI IND STRL 8 (GLOVE) ×1 IMPLANT
GLOVE BIOGEL PI INDICATOR 8 (GLOVE) ×1
GOWN STRL REUS W/ TWL LRG LVL3 (GOWN DISPOSABLE) ×1 IMPLANT
GOWN STRL REUS W/TWL LRG LVL3 (GOWN DISPOSABLE) ×4 IMPLANT
NEEDLE PRECISIONGLIDE 27X1.5 (NEEDLE) IMPLANT
NS IRRIG 1000ML POUR BTL (IV SOLUTION) ×2 IMPLANT
PACK BASIN DAY SURGERY FS (CUSTOM PROCEDURE TRAY) ×2 IMPLANT
PAD CAST 3X4 CTTN HI CHSV (CAST SUPPLIES) IMPLANT
PADDING CAST COTTON 3X4 STRL (CAST SUPPLIES)
STOCKINETTE 4X48 STRL (DRAPES) IMPLANT
STRIP CLOSURE SKIN 1/2X4 (GAUZE/BANDAGES/DRESSINGS) ×2 IMPLANT
SUT ETHILON 4 0 PS 2 18 (SUTURE) ×2 IMPLANT
SUT MNCRL AB 4-0 PS2 18 (SUTURE) ×2 IMPLANT
SYR BULB EAR ULCER 3OZ GRN STR (SYRINGE) ×2 IMPLANT
SYR CONTROL 10ML LL (SYRINGE) IMPLANT
TOWEL GREEN STERILE FF (TOWEL DISPOSABLE) ×2 IMPLANT
UNDERPAD 30X36 HEAVY ABSORB (UNDERPADS AND DIAPERS) ×2 IMPLANT

## 2020-03-21 NOTE — Interval H&P Note (Signed)
History and Physical Interval Note:  03/21/2020 7:25 AM  Rose Schwartz  has presented today for surgery, with the diagnosis of ganglion cyst of left wrist.  The various methods of treatment have been discussed with the patient and family. After consideration of risks, benefits and other options for treatment, the patient has consented to  Procedure(s) with comments: Left wrist ganglion cyst excision (Left) - total case time is 1 hour first dorsal extensor compartment release (Left) as a surgical intervention.  The patient's history has been reviewed, patient examined, no change in status, stable for surgery.  I have reviewed the patient's chart and labs.  Questions were answered to the patient's satisfaction.     [name]

## 2020-03-21 NOTE — Anesthesia Postprocedure Evaluation (Signed)
Anesthesia Post Note  Patient: Rose Schwartz  Procedure(s) Performed: Left wrist ganglion cyst excision (Left Wrist) first dorsal extensor compartment release (Left Wrist)     Patient location during evaluation: PACU Anesthesia Type: General Level of consciousness: awake and alert Pain management: pain level controlled Vital Signs Assessment: post-procedure vital signs reviewed and stable Respiratory status: spontaneous breathing, nonlabored ventilation and respiratory function stable Cardiovascular status: blood pressure returned to baseline and stable Postop Assessment: no apparent nausea or vomiting Anesthetic complications: no   No complications documented.  Last Vitals:  Vitals:   03/21/20 0855 03/21/20 0945  BP:  110/66  Pulse: 83 65  Resp: 13 16  Temp:  36.9 C  SpO2: 100% 100%    Last Pain:  Vitals:   03/21/20 0945  TempSrc:   PainSc: 3                  Lowella Curb

## 2020-03-21 NOTE — Anesthesia Procedure Notes (Signed)
Procedure Name: LMA Insertion Date/Time: 03/21/2020 7:37 AM Performed by: Sheryn Bison, CRNA Pre-anesthesia Checklist: Patient identified, Emergency Drugs available, Suction available and Patient being monitored Patient Re-evaluated:Patient Re-evaluated prior to induction Oxygen Delivery Method: Circle system utilized Preoxygenation: Pre-oxygenation with 100% oxygen Induction Type: IV induction Ventilation: Mask ventilation without difficulty LMA: LMA inserted LMA Size: 4.0 Number of attempts: 1 Airway Equipment and Method: Bite block Placement Confirmation: positive ETCO2 Tube secured with: Tape Dental Injury: Teeth and Oropharynx as per pre-operative assessment

## 2020-03-21 NOTE — Op Note (Signed)
Operative Note   DATE OF OPERATION: 03/21/2020  SURGICAL DEPARTMENT: Plastic Surgery  PREOPERATIVE DIAGNOSES: 1.  Left wrist volar ganglion cyst 2.  Left wrist deQuervains Tenosynovitis  POSTOPERATIVE DIAGNOSES:  same  PROCEDURE:  1.  Excision left wrist volar ganglion cyst 2.  First dorsal extensor compartment release  SURGEON: Ancil Linsey, MD  ASSISTANT: Zadie Cleverly, PA The advanced practice practitioner (APP) assisted throughout the case.  The APP was essential in retraction and counter traction when needed to make the case progress smoothly.  This retraction and assistance made it possible to see the tissue plans for the procedure.  The assistance was needed for blood control, tissue re-approximation and assisted with closure of the incision site.   ANESTHESIA:  General.   COMPLICATIONS: None.   INDICATIONS FOR PROCEDURE:  The patient, Rose Schwartz is a 19 y.o. female born on 2000/10/26, is here for treatment of symptomatic wrist ganglion and dequervains tenosynovitis. MRN: [id number]  CONSENT:  Informed consent was obtained directly from the patient. Risks, benefits and alternatives were fully discussed. Specific risks including but not limited to bleeding, infection, hematoma, seroma, scarring, pain, contracture, asymmetry, wound healing problems, and need for further surgery were all discussed. The patient did have an ample opportunity to have questions answered to satisfaction.   DESCRIPTION OF PROCEDURE:  The patient was taken to the operating room. SCDs were placed and Ancef antibiotics were given. General anesthesia was administered.  The patient's operative site was prepped and draped in a sterile fashion. A time out was performed and all information was confirmed to be correct.  Arm was exsanguinated with gravity and tourniquet inflated to .  Longitudinal incision made over ganglion cyst.  Dissected out with tenotomies.  Radial artery protected.   Cyst isolated and taken with portion of volar wrist capsule.  This had to be peeled off FCR sheath.  Longitudinal incision made over first dorsal extensor compartment.  Tenotomy dissection down to first dorsal extensor tendon compartment.  Sheath divided with 15 blade and extended with tenotomy scissors.  Complete release confirmed.  Separate compartments for APL and EPB both released.  Lidocaine injected in both areas.  Tourniquet let down at 26 minutes.  Hemostasis obtained. Closure with 4-0 monocryl and steristrips.  Dressed with soft dressing.   The patient tolerated the procedure well.  There were no complications. The patient was allowed to wake from anesthesia, extubated and taken to the recovery room in satisfactory condition.

## 2020-03-21 NOTE — Brief Op Note (Signed)
03/21/2020  8:34 AM  PATIENT:  Rose Schwartz  19 y.o. female  PRE-OPERATIVE DIAGNOSIS:  ganglion cyst of left wrist  POST-OPERATIVE DIAGNOSIS:  ganglion cyst of left wrist  PROCEDURE:  Procedure(s) with comments: Left wrist ganglion cyst excision (Left) - total case time is 1 hour first dorsal extensor compartment release (Left)  SURGEON:  Surgeon(s) and Role:    * Adorian Gwynne, Wendy Poet, MD - Primary  PHYSICIAN ASSISTANT: Materials engineer, PA  ASSISTANTS: none   ANESTHESIA:   general  EBL:  5   BLOOD ADMINISTERED:none  DRAINS: none   LOCAL MEDICATIONS USED:  LIDOCAINE   SPECIMEN:  Source of Specimen:  left wrist ganglion  DISPOSITION OF SPECIMEN:  PATHOLOGY  COUNTS:  YES  TOURNIQUET:   Total Tourniquet Time Documented: Upper Arm (Left) - 26 minutes Total: Upper Arm (Left) - 26 minutes   DICTATION: .Reubin Milan Dictation  PLAN OF CARE: Discharge to home after PACU  PATIENT DISPOSITION:  PACU - hemodynamically stable.   Delay start of Pharmacological VTE agent (>24hrs) due to surgical blood loss or risk of bleeding: not applicable

## 2020-03-21 NOTE — Transfer of Care (Signed)
Immediate Anesthesia Transfer of Care Note  Patient: Rose Schwartz  Procedure(s) Performed: Left wrist ganglion cyst excision (Left Wrist) first dorsal extensor compartment release (Left Wrist)  Patient Location: PACU  Anesthesia Type:General  Level of Consciousness: drowsy and patient cooperative  Airway & Oxygen Therapy: Patient Spontanous Breathing and Patient connected to face mask oxygen  Post-op Assessment: Report given to RN and Post -op Vital signs reviewed and stable  Post vital signs: Reviewed and stable  Last Vitals:  Vitals Value Taken Time  BP    Temp    Pulse    Resp 15 03/21/20 0842  SpO2    Vitals shown include unvalidated device data.  Last Pain:  Vitals:   03/21/20 0725  TempSrc: Oral  PainSc: 0-No pain         Complications: No complications documented.

## 2020-03-21 NOTE — Discharge Instructions (Addendum)
Activity As tolerated: NO showers for 3 days unless you can avoid getting the left hand/wrist wet. Keep ACE wrap on wrist/hand for 3 days. After showering, you can leave ace wrap off or put back on if you'd like. You have steri-strips that we will remove in 1-2 weeks.   No heavy activities Take tylenol/ibuprofen as needed for pain.   Diet: Regular. Drink plenty of fluids and eat healthy, high protein, low carbs.  Wound Care: Keep dressing clean & dry. You may change bandages after showering if you continue to notice some drainage.  Special Instructions: Call Doctor if any unusual problems occur such as pain, excessive Bleeding, unrelieved Nausea/vomiting, Fever &/or chills  Follow-up appointment: Scheduled for next week.    Post Anesthesia Home Care Instructions  Activity: Get plenty of rest for the remainder of the day. A responsible individual must stay with you for 24 hours following the procedure.  For the next 24 hours, DO NOT: -Drive a car -Advertising copywriter -Drink alcoholic beverages -Take any medication unless instructed by your physician -Make any legal decisions or sign important papers.  Meals: Start with liquid foods such as gelatin or soup. Progress to regular foods as tolerated. Avoid greasy, spicy, heavy foods. If nausea and/or vomiting occur, drink only clear liquids until the nausea and/or vomiting subsides. Call your physician if vomiting continues.  Special Instructions/Symptoms: Your throat may feel dry or sore from the anesthesia or the breathing tube placed in your throat during surgery. If this causes discomfort, gargle with warm salt water. The discomfort should disappear within 24 hours.  If you had a scopolamine patch placed behind your ear for the management of post- operative nausea and/or vomiting:  1. The medication in the patch is effective for 72 hours, after which it should be removed.  Wrap patch in a tissue and discard in the trash. Wash hands  thoroughly with soap and water. 2. You may remove the patch earlier than 72 hours if you experience unpleasant side effects which may include dry mouth, dizziness or visual disturbances. 3. Avoid touching the patch. Wash your hands with soap and water after contact with the patch.

## 2020-03-21 NOTE — Anesthesia Preprocedure Evaluation (Signed)
Anesthesia Evaluation  Patient identified by MRN, date of birth, ID band Patient awake    Reviewed: Allergy & Precautions, NPO status , Patient's Chart, lab work & pertinent test results  Airway Mallampati: II  TM Distance: >3 FB Neck ROM: Full    Dental no notable dental hx.    Pulmonary asthma ,    Pulmonary exam normal breath sounds clear to auscultation       Cardiovascular negative cardio ROS Normal cardiovascular exam Rhythm:Regular Rate:Normal     Neuro/Psych Bipolar Disorder negative neurological ROS  negative psych ROS   GI/Hepatic negative GI ROS, Neg liver ROS,   Endo/Other  negative endocrine ROS  Renal/GU negative Renal ROS  negative genitourinary   Musculoskeletal negative musculoskeletal ROS (+)   Abdominal   Peds negative pediatric ROS (+)  Hematology negative hematology ROS (+)   Anesthesia Other Findings   Reproductive/Obstetrics negative OB ROS                             Anesthesia Physical Anesthesia Plan  ASA: II  Anesthesia Plan: General   Post-op Pain Management:    Induction: Intravenous  PONV Risk Score and Plan: 3 and Ondansetron, Dexamethasone, Midazolam and Treatment may vary due to age or medical condition  Airway Management Planned: LMA  Additional Equipment:   Intra-op Plan:   Post-operative Plan: Extubation in OR  Informed Consent: I have reviewed the patients History and Physical, chart, labs and discussed the procedure including the risks, benefits and alternatives for the proposed anesthesia with the patient or authorized representative who has indicated his/her understanding and acceptance.     Dental advisory given  Plan Discussed with: CRNA  Anesthesia Plan Comments:         Anesthesia Quick Evaluation

## 2020-03-22 LAB — SURGICAL PATHOLOGY

## 2020-03-27 ENCOUNTER — Encounter (HOSPITAL_BASED_OUTPATIENT_CLINIC_OR_DEPARTMENT_OTHER): Payer: Self-pay | Admitting: Plastic Surgery

## 2020-03-29 NOTE — Progress Notes (Signed)
Patient is a 19 year old female here for follow-up after left wrist ganglion cyst excision, left first dorsal extensor compartment release with Dr. Arita Miss on 03/21/2020.  Patient reports she is overall doing well, has had some mild pain.  On exam incisions are intact, minimal swelling noted, 2+ radial pulse, normal sensation and range of motion of left hand/fingers.  Some pain with flexion of left thumb.  No erythema.  Steri-Strips removed from left wrist/hand, replaced Steri-Strip over left midline incision. Discussed with patient she no longer needs to wear compressive wrap, she reported she would like to continue wearing it at this time for approximately one more week.  Discussed with her this will be fine.  Recommend following up as needed, discussed with patient pain in left thumb will improve over the next few weeks.  She is very pleased with how things are going so far.  We did discuss that there is a potential for the ganglion cyst to return, patient agreed and understood.

## 2020-03-30 ENCOUNTER — Encounter: Payer: Self-pay | Admitting: Surgical

## 2020-03-30 ENCOUNTER — Ambulatory Visit (INDEPENDENT_AMBULATORY_CARE_PROVIDER_SITE_OTHER): Payer: Medicaid Other | Admitting: Surgical

## 2020-03-30 ENCOUNTER — Other Ambulatory Visit: Payer: Self-pay

## 2020-03-30 VITALS — BP 115/75 | HR 92 | Temp 98.1°F

## 2020-03-30 DIAGNOSIS — M67432 Ganglion, left wrist: Secondary | ICD-10-CM

## 2020-03-30 DIAGNOSIS — M654 Radial styloid tenosynovitis [de Quervain]: Secondary | ICD-10-CM

## 2020-04-06 ENCOUNTER — Ambulatory Visit (INDEPENDENT_AMBULATORY_CARE_PROVIDER_SITE_OTHER): Payer: Medicaid Other | Admitting: Plastic Surgery

## 2020-04-06 ENCOUNTER — Encounter: Payer: Self-pay | Admitting: Plastic Surgery

## 2020-04-06 ENCOUNTER — Other Ambulatory Visit: Payer: Self-pay

## 2020-04-06 ENCOUNTER — Encounter: Payer: Medicaid Other | Admitting: Plastic Surgery

## 2020-04-06 VITALS — BP 136/80 | HR 76 | Temp 98.0°F

## 2020-04-06 DIAGNOSIS — M67432 Ganglion, left wrist: Secondary | ICD-10-CM

## 2020-04-06 DIAGNOSIS — M654 Radial styloid tenosynovitis [de Quervain]: Secondary | ICD-10-CM

## 2020-04-06 NOTE — Progress Notes (Signed)
Patient is a 19 year old female here for follow-up after undergoing left wrist ganglion cyst excision and left first dorsal extensor compartment release with Dr. Arita Miss on 03/21/2020.  ~ 2 weeks PO. Patient reports she is doing well overall.  Incisions are healing nicely, C/D/I.  No signs of infection, redness, drainage.  No swelling present.  2+ radial pulse.  Sensation and motor intact of left thumb and fingers.  Patient reports some mild discomfort and limited range of motion of extension of the wrist. Discussed possible PT. Patient would like to give it a little more time before considering PT. Patient denies fever/chills, nausea/vomiting.  Follow-up in 3 to 4 weeks.  Call office with any questions/concerns.  The 21st Century Cures Act was signed into law in 2016 which includes the topic of electronic health records.  This provides immediate access to information in MyChart.  This includes consultation notes, operative notes, office notes, lab results and pathology reports.  If you have any questions about what you read please let us know at your next visit or call us at the office.  We are right here with you.

## 2020-04-11 DIAGNOSIS — F411 Generalized anxiety disorder: Secondary | ICD-10-CM | POA: Diagnosis not present

## 2020-04-11 DIAGNOSIS — F4310 Post-traumatic stress disorder, unspecified: Secondary | ICD-10-CM | POA: Diagnosis not present

## 2020-04-11 DIAGNOSIS — F330 Major depressive disorder, recurrent, mild: Secondary | ICD-10-CM | POA: Diagnosis not present

## 2020-04-17 DIAGNOSIS — F4310 Post-traumatic stress disorder, unspecified: Secondary | ICD-10-CM | POA: Diagnosis not present

## 2020-04-17 DIAGNOSIS — F411 Generalized anxiety disorder: Secondary | ICD-10-CM | POA: Diagnosis not present

## 2020-04-17 DIAGNOSIS — F330 Major depressive disorder, recurrent, mild: Secondary | ICD-10-CM | POA: Diagnosis not present

## 2020-04-25 DIAGNOSIS — F411 Generalized anxiety disorder: Secondary | ICD-10-CM | POA: Diagnosis not present

## 2020-04-25 DIAGNOSIS — F330 Major depressive disorder, recurrent, mild: Secondary | ICD-10-CM | POA: Diagnosis not present

## 2020-04-25 DIAGNOSIS — F4310 Post-traumatic stress disorder, unspecified: Secondary | ICD-10-CM | POA: Diagnosis not present

## 2020-05-01 ENCOUNTER — Other Ambulatory Visit: Payer: Self-pay

## 2020-05-01 NOTE — Progress Notes (Deleted)
Patient is a 19 year old female here for follow-up after undergoing left wrist ganglion cyst excision and left dorsal extensor compartment release with Dr. Arita Miss on 03/21/2020.  ~ 6 weeks PO  PT if ROM still limited?

## 2020-05-01 NOTE — Patient Outreach (Addendum)
Care Coordination  05/01/2020  Rose Schwartz [birth year] [id number]    An unsuccessful telephone outreach was attempted today. The patient was referred to the case management team for assistance with care management and care coordination.   Follow Up Plan: The Managed Medicaid care management team will reach out to the patient again over the next 7-10 days.  Telephone follow up appointment with Managed Medicaid care management team member scheduled for: May 09, 2020 @ 9:00am.    [name], BSW, MSW, LCSW Social Work Case Production designer, theatre/television/film - [facility] Managed Care [facility]  [facility]  Direct Dial: [phone]

## 2020-05-01 NOTE — Patient Instructions (Signed)
An unsuccessful telephone outreach was attempted today. The patient was referred to the case management team for assistance with care management and care coordination.   Follow Up Plan: The Managed Medicaid care management team will reach out to the patient again over the next 7-10 days.  Telephone follow up appointment with Managed Medicaid care management team member scheduled for: May 09, 2020 @ 9:00am.  [name], BSW, MSW, LCSW Social Work Case Production designer, theatre/television/film - [facility] Managed Care [facility]  [facility]  Direct Dial: [phone]

## 2020-05-04 ENCOUNTER — Ambulatory Visit: Payer: Medicaid Other | Admitting: Plastic Surgery

## 2020-05-04 DIAGNOSIS — M67432 Ganglion, left wrist: Secondary | ICD-10-CM

## 2020-05-04 DIAGNOSIS — M654 Radial styloid tenosynovitis [de Quervain]: Secondary | ICD-10-CM

## 2020-05-09 ENCOUNTER — Other Ambulatory Visit: Payer: Self-pay

## 2020-05-09 DIAGNOSIS — F330 Major depressive disorder, recurrent, mild: Secondary | ICD-10-CM | POA: Diagnosis not present

## 2020-05-09 DIAGNOSIS — F411 Generalized anxiety disorder: Secondary | ICD-10-CM | POA: Diagnosis not present

## 2020-05-09 DIAGNOSIS — F4310 Post-traumatic stress disorder, unspecified: Secondary | ICD-10-CM | POA: Diagnosis not present

## 2020-05-09 NOTE — Patient Instructions (Signed)
A second unsuccessful telephone outreach was attempted today. The patient was referred to the case management team for assistance with care management and care coordination.   Follow Up Plan: Licensed Clinical Social Worker will make third outreach attempt on May 18, 2020 @ 9:00am. A HIPAA compliant phone message was left for the patient providing contact information and requesting a return call.   [name], BSW, MSW, LCSW Social Work Case Production designer, theatre/television/film - [facility] Managed Care [facility]   [facility]  Direct Dial: [phone]

## 2020-05-09 NOTE — Patient Outreach (Signed)
Care Coordination  05/09/2020  Rose Schwartz Nov 07, 2000 [id number]  A second unsuccessful telephone outreach was attempted today. The patient was referred to the case management team for assistance with care management and care coordination.   Follow Up Plan: Licensed Clinical Social Worker will follow-up in 10-14 days. A HIPAA compliant phone message was left for the patient providing contact information and requesting a return call.   [name], BSW, MSW, LCSW Social Work Case Production designer, theatre/television/film - [facility] Managed Care [facility]  [facility]  Direct Dial: [phone]

## 2020-05-11 ENCOUNTER — Other Ambulatory Visit: Payer: Self-pay

## 2020-05-11 ENCOUNTER — Telehealth: Payer: Self-pay | Admitting: *Deleted

## 2020-05-11 ENCOUNTER — Ambulatory Visit: Payer: Self-pay

## 2020-05-11 NOTE — Patient Instructions (Signed)
Visit Information Ms. Goller, it was a pleasure speaking with you today. Please remember that another member of the Managed Medicaid Care Team will contact you to assist you with your concerns that were addressed today. If you have any questions or concerns, please contact me at [phone].   Ms. Whirley was given information about Medicaid Managed Care team care coordination services as a part of their Healthy [facility] Medicaid benefit. [name] verbally consented to engagement with the [facility] Managed Care team.   For questions related to your Healthy [facility] health plan, please call: [phone] or visit the homepage here: [email or web address]  If you would like to schedule transportation through your Healthy [facility] plan, please call the following number at least 2 days in advance of your appointment: [phone]  Goals Addressed              This Visit's Progress     "I need help with food until I am approved for SNAP benefits. (pt-stated)        CARE PLAN ENTRY Medicaid Managed Care (see longitudinal plan of care for additional care plan information)  Current Barriers:   Community resources access barrier: Patient Lacks knowledge of community resource: food assistance resources.  Limited access to food  Financial constraints related to not working since July when she gave birth. Patient, significant other and their 73 month old son reside together. Significant other is supporting the family but patient reports he is having some difficulty. She reported that she applied for SNAP benefits about a week ago but has not received any response regarding her application. She stated that recently her biological father identified himself to her, and he has been assisting financially since that time.   Clinical Social Work Clinical Goal(s):   Over the next 30 days, patient will work with Managed Medicaid Care Team to address  needs related to food procurement.  Interventions:  Inter-disciplinary care team collaboration (see longitudinal plan of care)  Patient interviewed and appropriate assessments performed  Provided patient with information about food procurement and the length of time that may be required for her application to be reviewed.  Collaborated with BSW re: food procurement.  Patient Self Care Activities:   Patient will work with Managed Medicaid Care Team.  Licensed Clinical Social Worker will refer to BSW for assistance.  Initial goal documentation       "I want to be scheduled at [facility]." (pt-stated)        CARE PLAN ENTRY Medicaid Managed Care (see longitudinal plan of care for additional care plan information)  Current Barriers:   Patient does not have a PCP.  Patient needs assistance with appointment scheduling and follow up with community agency, [facility].  Clinical Social Work Clinical Goal(s):   Over the next 30 days, patient will work with Managed Medicaid Care Team to address needs related to scheduling appointment with [facility].  Interventions:  Inter-disciplinary care team collaboration (see longitudinal plan of care)  Patient interviewed and appropriate assessments performed  Collaborated with BSW re: scheduling appointment.  Patient Self Care Activities:   Patient will actively participate in scheduling appointment at [facility].  Patient will work with Managed Medicaid Care Team  Licensed Clinical Social Worker will refer to BSW for assistance.  Initial goal documentation         The patient verbalized understanding of instructions provided today and agreed to receive a mailed copy of patient  instruction and/or Transport planner.  Licensed Clinical Social Worker will follow up on May 26, 2020 @ 1:00pm. The Managed Medicaid care management team will reach out to the patient  again over the next 7-14 days.  The patient has been provided with contact information for the Managed Medicaid care management team and has been advised to call with any health related questions or concerns.    [name], BSW, MSW, LCSW Social Work Case Production designer, theatre/television/film - [facility] Managed Care [facility]   [facility]  Direct Dial: [phone]

## 2020-05-11 NOTE — Patient Outreach (Signed)
Care Coordination- Social Work  05/11/2020  Rose Schwartz Nov 20, 2000 [id number]  Subjective:    Rose Schwartz is an 19 y.o. year old female who is a primary patient of Patient, No Pcp Per.    Rose Schwartz was given information about Medicaid Managed Care team care coordination services today. [name] agreed to services and verbal consent obtained  Review of patient status, laboratory and other test data was performed as part of evaluation for provision of services.  SDOH:   SDOH Screenings   Alcohol Screen: Low Risk   . Last Alcohol Screening Score (AUDIT): 0  Depression (PHQ2-9): Low Risk   . PHQ-2 Score: 1  Financial Resource Strain: High Risk  . Difficulty of Paying Living Expenses: Very hard  Food Insecurity: Food Insecurity Present  . Worried About Charity fundraiser in the Last Year: Often true  . Ran Out of Food in the Last Year: Sometimes true  Housing: Low Risk   . Last Housing Risk Score: 0  Physical Activity: Inactive  . Days of Exercise per Week: 0 days  . Minutes of Exercise per Session: 0 min  Social Connections: Moderately Isolated  . Frequency of Communication with Friends and Family: More than three times a week  . Frequency of Social Gatherings with Friends and Family: More than three times a week  . Attends Religious Services: 1 to 4 times per year  . Active Member of Clubs or Organizations: No  . Attends Archivist Meetings: Never  . Marital Status: Never married  Stress: Stress Concern Present  . Feeling of Stress : To some extent  Tobacco Use: Low Risk   . Smoking Tobacco Use: Never Smoker  . Smokeless Tobacco Use: Never Used  Transportation Needs: No Transportation Needs  . Lack of Transportation (Medical): No  . Lack of Transportation (Non-Medical): No   SDOH Interventions     Most Recent Value  SDOH Interventions  SDOH Interventions for the Following Domains Food Insecurity, Financial Strain  Food  Insecurity Interventions Other (Comment)  Financial Strain Interventions Other (Comment)  [Patient gave birth 01/17/2020. She wants to return back to work and is looking for employment.]  Housing Interventions Intervention Not Indicated  Intimate Partner Violence Interventions Intervention Not Indicated  Physical Activity Interventions Other (Comments)  [LCSW counseled about benefits of physical activity.]  Stress Interventions Intervention Not Indicated  Social Connections Interventions Intervention Not Indicated  Transportation Interventions Intervention Not Indicated  Alcohol Brief Interventions/Follow-up AUDIT Score <7 follow-up not indicated      Objective:    Medications:  Medications Reviewed Today    Reviewed by Netta Neat, LCSW (Social Worker) on 05/11/20 at [place] List Status: <None>  Medication Order Taking? Sig Documenting Provider Last Dose Status Informant  albuterol (VENTOLIN HFA) 108 (90 Base) MCG/ACT inhaler [id number] Yes Inhale 2 puffs into the lungs every 4 (four) hours as needed for wheezing or shortness of breath (cough, shortness of breath or wheezing.). Robyn Haber, MD Taking Active Self  Blood Pressure Monitoring (BLOOD PRESSURE KIT) DEVI [id number]  1 Device by Does not apply route as needed. Rasch, Artist Pais, NP  Active Self  calcium carbonate (TUMS - DOSED IN MG ELEMENTAL CALCIUM) 500 MG chewable tablet [id number]  Chew 2 tablets by mouth 3 (three) times daily as needed for indigestion or heartburn. [provider]  Active Self  EPINEPHrine 0.3 mg/0.3 mL IJ SOAJ injection [id number]  Inject 0.3 mg into the muscle  as needed for anaphylaxis.   Patient not taking: Reported on 04/06/2020   [provider]  Active Self           Med Note Vita Barley Jan 17, 2020 10:51 AM) On hand, if needed  ibuprofen (ADVIL) 600 MG tablet [id number]  Take 1 tablet (600 mg total) by mouth every 6 (six) hours. Lilland, Alana, DO  Active     Prenatal Vit-Fe Fumarate-FA (PREPLUS) 27-1 MG TABS [id number]  Take 1 tablet by mouth daily. Osborne Oman, MD  Active Self          Fall/Depression Screening:  No flowsheet data found. PHQ 2/9 Scores 05/11/2020 02/01/2020 01/11/2020 01/03/2020 11/16/2019 11/16/2019 10/14/2019  PHQ - 2 Score '1 1 2 2 2 2 2  ' PHQ- 9 Score - '6 9 15 11 10 11    ' Assessment:  Goals Addressed              This Visit's Progress   .  "I need help with food until I am approved for SNAP benefits. (pt-stated)        [place] Medicaid Managed Care (see longitudinal plan of care for additional care plan information)  Current Barriers:  . Community resources access barrier: Patient Lacks knowledge of community resource: food assistance resources. . Limited access to food . Financial constraints related to not working since July when she gave birth. Patient, significant other and their 49 month old son reside together. Significant other is supporting the family but patient reports he is having some difficulty. She reported that she applied for SNAP benefits about a week ago but has not received any response regarding her application. She stated that recently her biological father identified himself to her, and he has been assisting financially since that time.   Clinical Social Work Clinical Goal(s):  Marland Kitchen Over the next 30 days, patient will work with Managed Medicaid Care Team to address needs related to food procurement.  Interventions: . Inter-disciplinary care team collaboration (see longitudinal plan of care) . Patient interviewed and appropriate assessments performed . Provided patient with information about food procurement and the length of time that may be required for her application to be reviewed. [name] with BSW re: food procurement.  Patient Self Care Activities:  . Patient will work with Managed Medicaid Care Team. . Licensed Clinical Social Worker will refer to BSW for  assistance.  Initial goal documentation     .  "I want to be scheduled at [facility] for Primary Care." (pt-stated)        CARE PLAN ENTRY Medicaid Managed Care (see longitudinal plan of care for additional care plan information)  Current Barriers:  . Patient does not have a PCP. Marland Kitchen Patient needs assistance with appointment scheduling and follow up with community agency, [facility].  Clinical Social Work Clinical Goal(s):  Marland Kitchen Over the next 30 days, patient will work with Managed Medicaid Care Team to address needs related to scheduling appointment with [facility].  Interventions: . Inter-disciplinary care team collaboration (see longitudinal plan of care) . Patient interviewed and appropriate assessments performed . Collaborated with BSW re: scheduling appointment.  Patient Self Care Activities:  . Patient will actively participate in scheduling appointment at [facility]. . Patient will work with Managed Medicaid Care Team . Licensed Clinical Social Worker will refer to BSW for assistance.  Initial goal documentation         Plan: LCSW  will follow up on May 26, 2020 @ 1:00pm.

## 2020-05-11 NOTE — Telephone Encounter (Signed)
Patient would like to establish care at [facility] .She was given their number as well as Internal Medicine to follow up. Rose Schwartz PEC   [phone]

## 2020-05-15 ENCOUNTER — Other Ambulatory Visit: Payer: Self-pay

## 2020-05-15 DIAGNOSIS — F4310 Post-traumatic stress disorder, unspecified: Secondary | ICD-10-CM | POA: Diagnosis not present

## 2020-05-15 DIAGNOSIS — F330 Major depressive disorder, recurrent, mild: Secondary | ICD-10-CM | POA: Diagnosis not present

## 2020-05-15 DIAGNOSIS — F411 Generalized anxiety disorder: Secondary | ICD-10-CM | POA: Diagnosis not present

## 2020-05-15 NOTE — Patient Outreach (Signed)
Care Coordination  05/15/2020  RALPHINE HINKS 01-22-2001 [id number]  An unsuccessful telephone outreach was attempted today. The patient was referred to the case management team for assistance with care management and care coordination.   Follow Up Plan: The Managed Medicaid care management team will reach out to the patient again over the next 7 days.   Gus Puma, BSW, [facility]   [place]  High Risk Managed Medicaid Team

## 2020-05-15 NOTE — Patient Instructions (Signed)
Visit Information  Ms. Rose Schwartz  - as a part of your Medicaid benefit, you are eligible for care management and care coordination services at no cost or copay. I was unable to reach you by phone today but would be happy to help you with your health related needs. Please feel free to call me @ [phone].   A member of the Managed Medicaid care management team will reach out to you again over the next 7 days.   Gus Puma, BSW, [facility]  [place]  High Risk Managed Medicaid Team

## 2020-05-17 DIAGNOSIS — Z20822 Contact with and (suspected) exposure to covid-19: Secondary | ICD-10-CM | POA: Diagnosis not present

## 2020-05-18 ENCOUNTER — Ambulatory Visit: Payer: Self-pay

## 2020-05-23 DIAGNOSIS — F4310 Post-traumatic stress disorder, unspecified: Secondary | ICD-10-CM | POA: Diagnosis not present

## 2020-05-23 DIAGNOSIS — F330 Major depressive disorder, recurrent, mild: Secondary | ICD-10-CM | POA: Diagnosis not present

## 2020-05-23 DIAGNOSIS — F411 Generalized anxiety disorder: Secondary | ICD-10-CM | POA: Diagnosis not present

## 2020-05-24 ENCOUNTER — Other Ambulatory Visit: Payer: Self-pay

## 2020-05-24 NOTE — Patient Instructions (Signed)
Visit Information  Ms. Fernholz was given information about Medicaid Managed Care team care coordination services as a part of their Healthy [facility] Medicaid benefit. [name] verbally consented to engagement with the [facility] Managed Care team.   For questions related to your Healthy [facility] health plan, please call: [phone] or visit the homepage here: [email or web address]  If you would like to schedule transportation through your Healthy [facility] plan, please call the following number at least 2 days in advance of your appointment: [phone]  Goals Addressed              This Visit's Progress   .  "I need help with food until I am approved for SNAP benefits. (pt-stated)        CARE PLAN ENTRY Medicaid Managed Care (see longitudinal plan of care for additional care plan information)  Current Barriers:  . Community resources access barrier: Patient Lacks knowledge of community resource: food assistance resources. . Limited access to food . Financial constraints related to not working since July when she gave birth. Patient, significant other and their 45 month old son reside together. Significant other is supporting the family but patient reports he is having some difficulty. She reported that she applied for SNAP benefits about a week ago but has not received any response regarding her application. She stated that recently her biological father identified himself to her, and he has been assisting financially since that time.   Clinical Social Work Clinical Goal(s):  Marland Kitchen Over the next 30 days, patient will work with Managed Medicaid Care Team to address needs related to food procurement.  Interventions: . Inter-disciplinary care team collaboration (see longitudinal plan of care) . Patient interviewed and appropriate assessments performed . Provided patient with information about food procurement and the length of time that may  be required for her application to be reviewed. Steele Sizer with BSW re: food procurement. Patient has started to receive SNAP benefits.  Patient Self Care Activities:  . Patient will work with Managed Medicaid Care Team. . Licensed Clinical Social Worker will refer to BSW for assistance.  Initial goal documentation        Social Worker will follow up in 30 days.   Gus Puma, BSW, [facility]  [place]  High Risk Managed Medicaid Team

## 2020-05-26 ENCOUNTER — Other Ambulatory Visit: Payer: Self-pay

## 2020-05-26 ENCOUNTER — Ambulatory Visit: Payer: Self-pay

## 2020-05-26 NOTE — Patient Outreach (Signed)
Care Coordination- Social Work  05/26/2020  EVALENE VATH 2000-07-30 [id number]  Subjective:    Rose Schwartz is an 19 y.o. year old female who is a primary patient of Patient, No Pcp Per.    Ms. Doig was given information about Medicaid Managed Care team care coordination services today. [name] agreed to services and verbal consent obtained  Review of patient status, laboratory and other test data was performed as part of evaluation for provision of services.  SDOH:   SDOH Screenings   Alcohol Screen: Low Risk   . Last Alcohol Screening Score (AUDIT): 0  Depression (PHQ2-9): Low Risk   . PHQ-2 Score: 0  Financial Resource Strain: High Risk  . Difficulty of Paying Living Expenses: Very hard  Food Insecurity: No Food Insecurity  . Worried About Charity fundraiser in the Last Year: Never true  . Ran Out of Food in the Last Year: Never true  Housing: Low Risk   . Last Housing Risk Score: 0  Physical Activity: Inactive  . Days of Exercise per Week: 0 days  . Minutes of Exercise per Session: 0 min  Social Connections: Moderately Isolated  . Frequency of Communication with Friends and Family: More than three times a week  . Frequency of Social Gatherings with Friends and Family: More than three times a week  . Attends Religious Services: 1 to 4 times per year  . Active Member of Clubs or Organizations: No  . Attends Archivist Meetings: Never  . Marital Status: Never married  Stress: Stress Concern Present  . Feeling of Stress : To some extent  Tobacco Use: Low Risk   . Smoking Tobacco Use: Never Smoker  . Smokeless Tobacco Use: Never Used  Transportation Needs: No Transportation Needs  . Lack of Transportation (Medical): No  . Lack of Transportation (Non-Medical): No   SDOH Interventions     Most Recent Value  SDOH Interventions  SDOH Interventions for the Following Domains [place] Insecurity Interventions  Intervention Not Indicated  [Patient started receiving SNAP benefits.]  Financial Strain Interventions --  [LCSW provided information for resources.]      Objective:    Medications:  Medications Reviewed Today    Reviewed by Netta Neat, LCSW (Social Worker) on 05/11/20 at [place] List Status: <None>  Medication Order Taking? Sig Documenting Provider Last Dose Status Informant  albuterol (VENTOLIN HFA) 108 (90 Base) MCG/ACT inhaler [id number] Yes Inhale 2 puffs into the lungs every 4 (four) hours as needed for wheezing or shortness of breath (cough, shortness of breath or wheezing.). Robyn Haber, MD Taking Active Self  Blood Pressure Monitoring (BLOOD PRESSURE KIT) DEVI [id number]  1 Device by Does not apply route as needed. Rasch, Artist Pais, NP  Active Self  calcium carbonate (TUMS - DOSED IN MG ELEMENTAL CALCIUM) 500 MG chewable tablet [id number]  Chew 2 tablets by mouth 3 (three) times daily as needed for indigestion or heartburn. [provider]  Active Self  EPINEPHrine 0.3 mg/0.3 mL IJ SOAJ injection [id number]  Inject 0.3 mg into the muscle as needed for anaphylaxis.   Patient not taking: Reported on 04/06/2020   [provider]  Active Self           Med Note Vita Barley Jan 17, 2020 10:51 AM) On hand, if needed  ibuprofen (ADVIL) 600 MG tablet [id number]  Take 1 tablet (600 mg total) by mouth every  6 (six) hours. Lilland, Alana, DO  Active   Prenatal Vit-Fe Fumarate-FA (PREPLUS) 27-1 MG TABS [id number]  Take 1 tablet by mouth daily. Osborne Oman, MD  Active Self          Fall/Depression Screening:  No flowsheet data found. PHQ 2/9 Scores 05/26/2020 05/11/2020 02/01/2020 01/11/2020 01/03/2020 11/16/2019 11/16/2019  PHQ - 2 Score 0 '1 1 2 2 2 2  ' PHQ- 9 Score - - '6 9 15 11 10    ' Assessment:  Goals Addressed              This Visit's Progress   .  "I need help with food until I am approved for SNAP benefits. (pt-stated)   On track      [place] (see longitudinal plan of care for additional care plan information)  Current Barriers:  . Community resources access barrier: Patient Lacks knowledge of community resource: food assistance resources. . Limited access to food . Financial constraints related to not working since July when she gave birth. Patient, significant other and their 59 month old son reside together. Significant other is supporting the family but patient reports he is having some difficulty. She reported that she applied for SNAP benefits about a week ago but has not received any response regarding her application. She stated that recently her biological father identified himself to her, and he has been assisting financially since that time.   Clinical Social Work Clinical Goal(s):  Marland Kitchen Over the next 30 days, patient will work with Managed Medicaid Care Team to address needs related to food procurement.  Interventions: . Inter-disciplinary care team collaboration (see longitudinal plan of care) . Patient interviewed and appropriate assessments performed . Provided patient with information about food procurement and the length of time that may be required for her application to be reviewed. [name] with BSW re: food procurement. Patient has started to receive SNAP benefits. . BSW provided patient with resources for rental  and utility assistance. [place], [facility] [phone], [place] [phone], and the [facility]. Marland Kitchen Update 05/26/2020: Patient stated they still need assistance with rental and utilities. She contacted [employer], but she stated that the message on the answering machine stated they are not accepting applications at this time. She left a message at DSS and is now waiting to talk with someone there.  Patient Self Care Activities:  . Patient will work with Managed Medicaid Care Team. . Licensed  Clinical Social Worker will refer to BSW for assistance.  Please see past updates related to this goal by clicking on the "Past Updates" button in the selected goal     .  "I want to be scheduled at [place] for Primary Care." (pt-stated)   On track     [place] (see longitudinal plan of care for additional care plan information)  Current Barriers:  . Patient does not have a PCP. Marland Kitchen Patient needs assistance with appointment scheduling and follow up with community agency, [facility].  Clinical Social Work Clinical Goal(s):  Marland Kitchen Over the next 30 days, patient will work with Managed Medicaid Care Team to address needs related to scheduling appointment with [facility].  Interventions: . Inter-disciplinary care team collaboration (see longitudinal plan of care) . Patient interviewed and appropriate assessments performed . Collaborated with BSW re: scheduling appointment.  Patient Self Care Activities:  . Patient will actively participate  in scheduling appointment at [facility]. . Patient will work with Managed Medicaid Care Team . Licensed Clinical Social Worker will refer to BSW for assistance. Marland Kitchen Update 05/26/2020: Patient stated she turned in the intake packet for [facility] and is now waiting to be scheduled.  Please see past updates related to this goal by clicking on the "Past Updates" button in the selected goal      .  "I want to get a new job." (pt-stated)        [place] (see longitudinal plan of care for additional care plan information)  Current Barriers:  . 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  . Patient has been applying for at home employment so that she will be able to care for her infant son, but attempts have been unsuccessful so far.  Clinical Social Work Clinical Goal(s):   Marland Kitchen Over the next 90 days, patient will work with SW to address concerns related to employment difficulties.  Interventions: . Inter-disciplinary care team collaboration (see longitudinal plan of care) . Patient interviewed and appropriate assessments performed . Provided mental health counseling with regard to ADHD, depression, PTSD and bipolar disorder and how symptoms can look alike in various diagnoses.   . Provided patient with information about using caution regarding searching for at home employment. . Discussed plans with patient for ongoing care management follow up and provided patient with direct contact information for care management team . Collaborated with BSW re: employment resources   Patient Self Care Activities:  . Patient will continue receiving outpatient therapy at [facility], and will actively participate in sessions. . Patient states she will begin EMDR soon in therapy. . Licensed Clinical Social Worker will follow up with patient on July 12, 2019 @ 1:00pm.  Initial goal documentation        Plan: LCSW will follow up with patient on July 12, 2019 @ 1:00pm.  Netta Neat, BSW, MSW, [place]: [phone]

## 2020-05-26 NOTE — Patient Instructions (Signed)
Visit Information Ms. Anspaugh, it was a pleasure speaking with you today. Please remember to reach out to the various resources we discussed for assistance with your utilities and rental needs.   Ms. Farnworth was given information about Medicaid Managed Care team care coordination services as a part of their Healthy [facility] Medicaid benefit. [name] verbally consented to engagement with the [facility] Managed Care team.   For questions related to your Healthy [facility] health plan, please call: [phone] or visit the homepage here: [email or web address]  If you would like to schedule transportation through your Healthy [facility] plan, please call the following number at least 2 days in advance of your appointment: [phone]  Goals Addressed              This Visit's Progress     "I need help with food until I am approved for SNAP benefits. (pt-stated)   On track     CARE PLAN ENTRY Medicaid Managed Care (see longitudinal plan of care for additional care plan information)  Current Barriers:   Community resources access barrier: Patient Lacks knowledge of community resource: food assistance resources.  Limited access to food  Financial constraints related to not working since July when she gave birth. Patient, significant other and their 93 month old son reside together. Significant other is supporting the family but patient reports he is having some difficulty. She reported that she applied for SNAP benefits about a week ago but has not received any response regarding her application. She stated that recently her biological father identified himself to her, and he has been assisting financially since that time.   Clinical Social Work Clinical Goal(s):   Over the next 30 days, patient will work with Managed Medicaid Care Team to address needs related to food procurement.  Interventions:  Inter-disciplinary care team  collaboration (see longitudinal plan of care)  Patient interviewed and appropriate assessments performed  Provided patient with information about food procurement and the length of time that may be required for her application to be reviewed.  Collaborated with BSW re: food procurement. Patient has started to receive SNAP benefits.  BSW provided patient with resources for rental  and utility assistance. [facility] [phone], [facility] [phone], [facility] [phone], and the [facility].  Update 05/26/2020: Patient stated they still need assistance with rental and utilities. She contacted [employer], but she stated that the message on the answering machine stated they are not accepting applications at this time. She left a message at DSS and is now waiting to talk with someone there.  Patient Self Care Activities:   Patient will work with Managed Medicaid Care Team.  Licensed Clinical Social Worker will refer to BSW for assistance.  Please see past updates related to this goal by clicking on the "Past Updates" button in the selected goal       "I want to be scheduled at [facility]." (pt-stated)   On track     CARE PLAN ENTRY Medicaid Managed Care (see longitudinal plan of care for additional care plan information)  Current Barriers:   Patient does not have a PCP.  Patient needs assistance with appointment scheduling and follow up with community agency, [facility].  Clinical Social Work Clinical Goal(s):   Over the next 30 days, patient will work with Managed Medicaid Care Team to address needs related to scheduling appointment with [facility].  Interventions:  Inter-disciplinary  care team collaboration (see longitudinal plan of care)  Patient interviewed and appropriate assessments performed  Collaborated with BSW re: scheduling appointment.  Patient Self Care  Activities:   Patient will actively participate in scheduling appointment at [facility].  Patient will work with Managed Medicaid Care Team  Licensed Clinical Social Worker will refer to BSW for assistance.  Update 05/26/2020: Patient stated she turned in the intake packet for [facility] and is now waiting to be scheduled.  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   Patient has been applying for at home employment so that she will be able to care for her infant son, but attempts have been unsuccessful so far.  Clinical Social Work Clinical Goal(s):   Over the next 90 days, patient will work with SW to address concerns related to employment difficulties.  Interventions:  Inter-disciplinary care team collaboration (see longitudinal plan of care)  Patient interviewed and appropriate assessments performed  Provided mental health counseling with regard to ADHD, depression, PTSD and bipolar disorder and how symptoms can look alike in various diagnoses.    Provided patient with information about using caution regarding searching for at home employment.  Discussed plans with patient for ongoing care management follow up and provided patient with direct contact information for care management team  Collaborated with BSW re: employment resources   Patient Self Care Activities:   Patient will continue receiving outpatient therapy at [facility], and will actively participate in sessions.  Patient states she will begin EMDR soon in therapy.  Licensed Clinical Social Worker will  follow up with patient on July 12, 2019 @ 1:00pm.  Initial goal documentation         The patient verbalized understanding of instructions provided today and agreed to receive a mailed copy of patient instruction and/or educational materials.  Licensed Clinical Social Worker will follow up on July 11, 2020 @ 1:00pm.  [name], BSW, MSW, LCSW Social Work Case Production designer, theatre/television/film - [facility] Managed Care [facility]   [facility]  Direct Dial: [phone]

## 2020-06-06 DIAGNOSIS — F330 Major depressive disorder, recurrent, mild: Secondary | ICD-10-CM | POA: Diagnosis not present

## 2020-06-06 DIAGNOSIS — F411 Generalized anxiety disorder: Secondary | ICD-10-CM | POA: Diagnosis not present

## 2020-06-06 DIAGNOSIS — F4310 Post-traumatic stress disorder, unspecified: Secondary | ICD-10-CM | POA: Diagnosis not present

## 2020-06-10 IMAGING — US US MFM OB DETAIL+14 WK
1 series · 13 of 28 positions shown · non-contrast
Comparison: none

[Series 1: us mfm ob detail+14 wk · 13 of 102 slices shown]
[im 4/102]
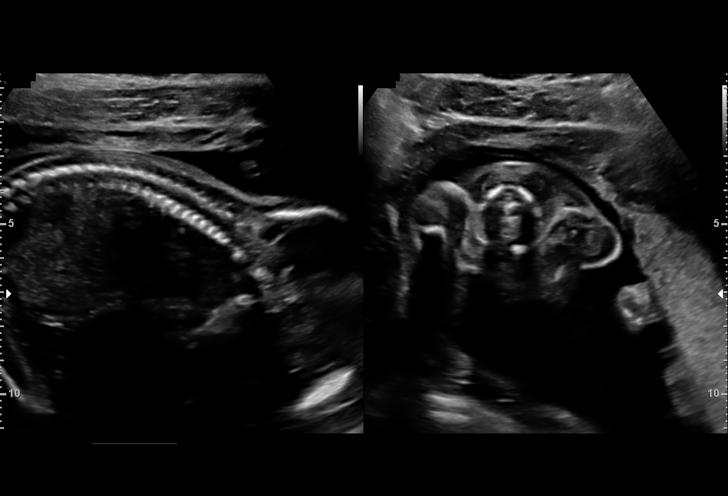
[im 12/102]
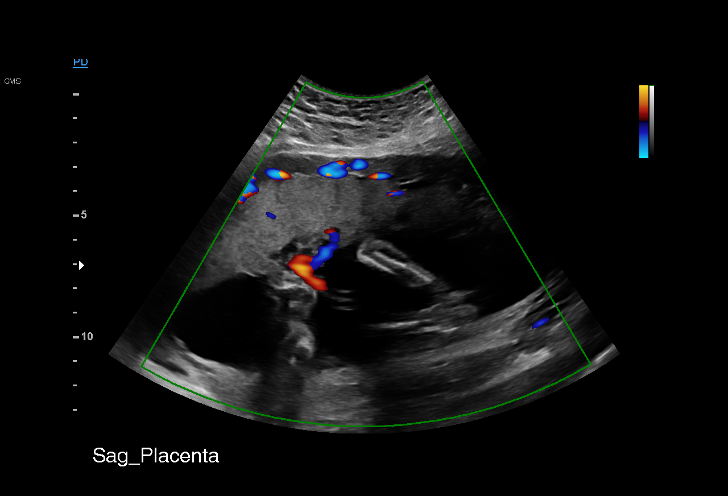
[im 19/102]
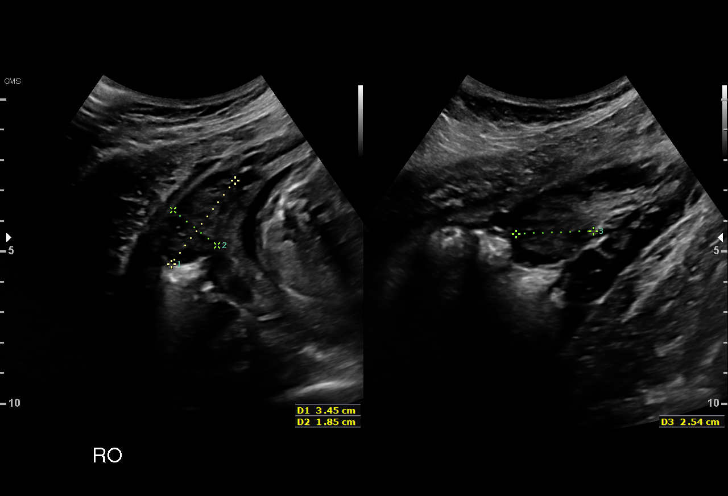
[im 27/102]
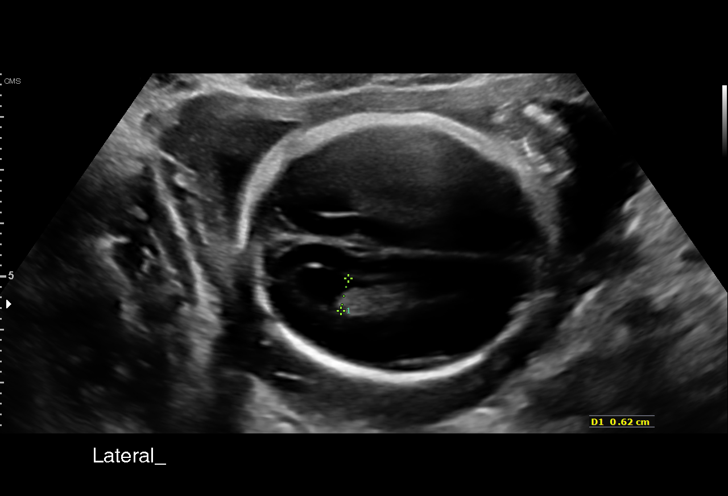
[im 34/102]
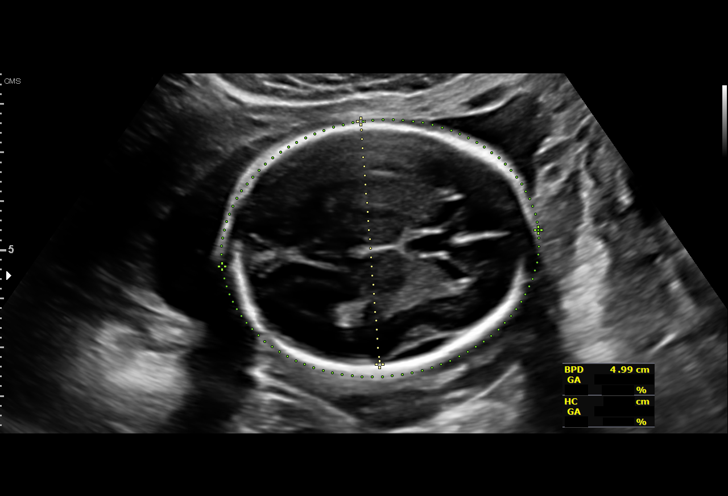
[im 42/102]
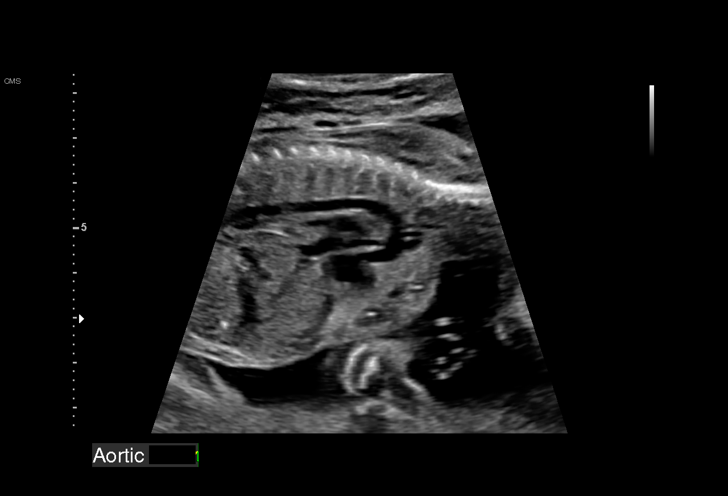
[im 53/102]
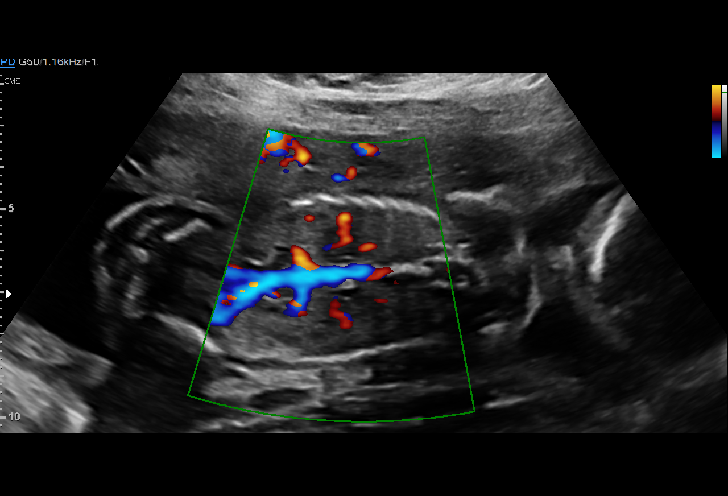
[im 60/102]
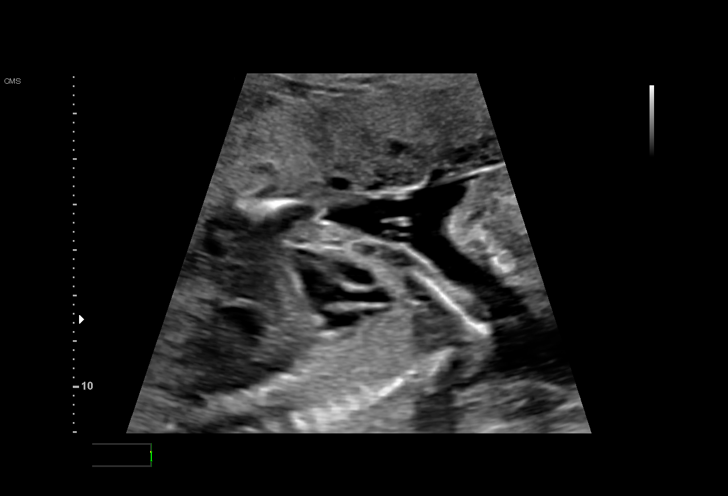
[im 68/102]
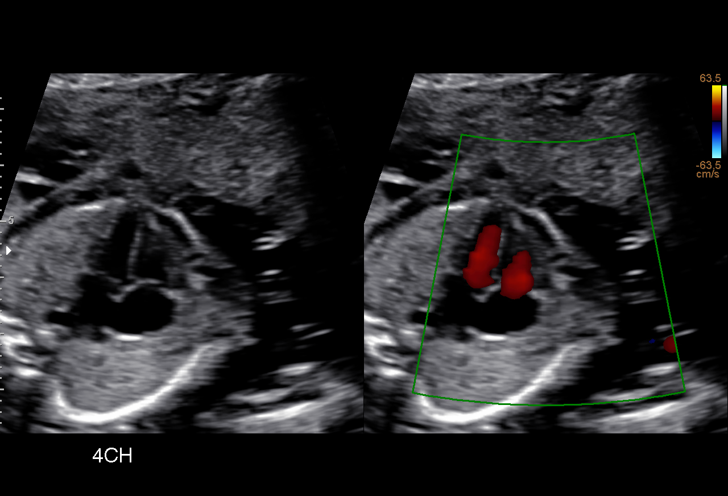
[im 75/102]
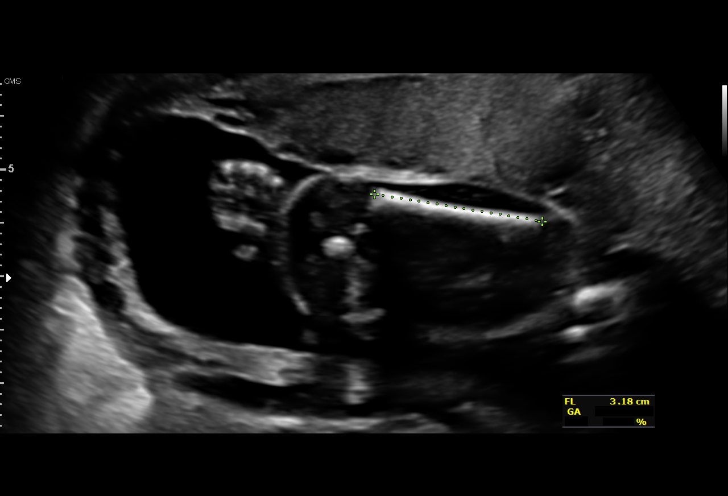
[im 83/102]
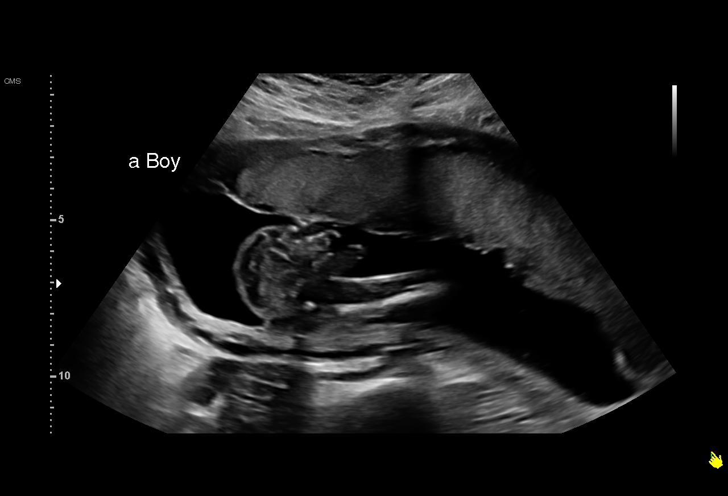
[im 90/102]
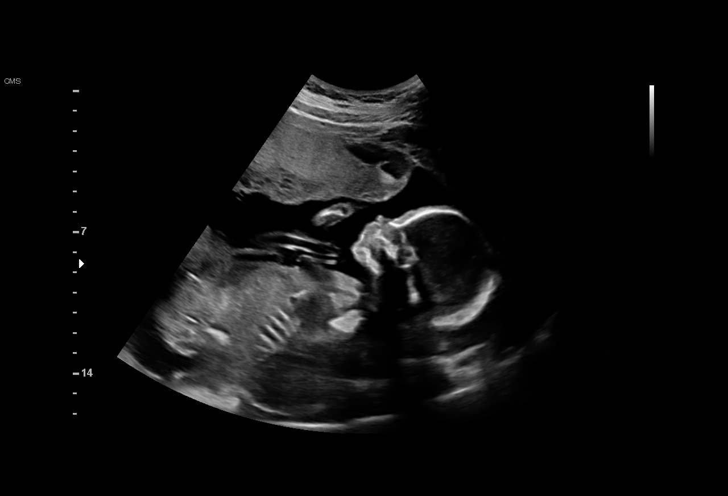
[im 98/102]
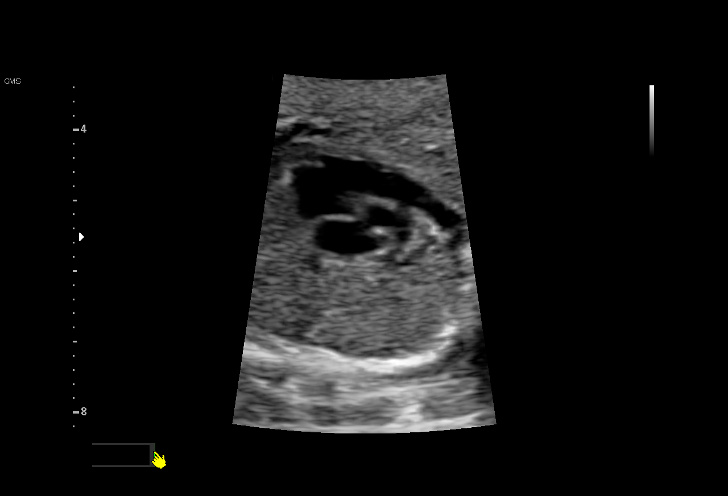

[13 of 28 positions shown; findings below may reference images not displayed]

[address]

 ----------------------------------------------------------------------

 ----------------------------------------------------------------------
Indications

  Antenatal screening for malformations (low
  risk NIPS)
  History of congenital or genetic condition
  (patient had amniotic bands when in utero)
  Genetic carrier (silent Ourari Tiger)
  19 weeks gestation of pregnancy
 ----------------------------------------------------------------------
Fetal Evaluation

 Num Of Fetuses:         1
 Fetal Heart Rate(bpm):  151
 Cardiac Activity:       Observed
 Presentation:           Cephalic
 Placenta:               Anterior
 P. Cord Insertion:      Visualized

 Amniotic Fluid
 AFI FV:      Within normal limits

                             Largest Pocket(cm)

Biometry

 BPD:      50.3  mm     G. Age:  21w 2d         97  %    CI:         72.7   %    70 - 86
                                                         FL/HC:      16.9   %    16.8 -
 HC:      187.6  mm     G. Age:  21w 0d         95  %    HC/AC:      1.20        1.09 -
 AC:      156.1  mm     G. Age:  20w 5d         82  %    FL/BPD:     63.0   %
 FL:       31.7  mm     G. Age:  19w 6d         53  %    FL/AC:      20.3   %    20 - 24
 HUM:      31.9  mm     G. Age:  20w 5d         81  %
 CER:      23.4  mm     G. Age:  21w 5d       > 95  %
 NFT:       4.2  mm

 CM:        4.8  mm
 Est. FW:     355  gm    0 lb 13 oz      90  %
Gestational Age

 LMP:           20w 6d        Date:  04/22/19                 EDD:   01/27/20
 U/S Today:     20w 5d                                        EDD:   01/28/20
 Best:          19w 4d     Det. By:  Early Ultrasound         EDD:   02/05/20
                                     (06/10/19)
Anatomy

 Cranium:               Appears normal         Aortic Arch:            Appears normal
 Cavum:                 Appears normal         Ductal Arch:            Appears normal
 Ventricles:            Appears normal         Diaphragm:              Appears normal
 Choroid Plexus:        Appears normal         Stomach:                Appears normal, left
                                                                       sided
 Cerebellum:            Appears normal         Abdomen:                Appears normal
 Posterior Fossa:       Appears normal         Abdominal Wall:         Appears nml (cord
                                                                       insert, abd wall)
 Nuchal Fold:           Appears normal         Cord Vessels:           Appears normal (3
                                                                       vessel cord)
 Face:                  Appears normal         Kidneys:                Appear normal
                        (orbits and profile)
 Lips:                  Appears normal         Bladder:                Appears normal
 Thoracic:              Appears normal         Spine:                  Appears normal
 Heart:                 Appears normal         Upper Extremities:      Appears normal
                        (4CH, axis, and
                        situs)
 RVOT:                  Appears normal         Lower Extremities:      Appears normal
 LVOT:                  Appears normal

 Other:  Heels/feet visualized. Hands not well visualized. Nasal bone
         visualized.
Cervix Uterus Adnexa

 Cervix
 Length:            3.5  cm.
 Normal appearance by transabdominal scan.

 Uterus
 No abnormality visualized.

 Left Ovary
 Within normal limits.

 Right Ovary
 Within normal limits.

 Cul De Sac
 No free fluid seen.

 Adnexa
 No abnormality visualized.
Impression

 We performed fetal anatomy scan. No makers of
 aneuploidies or fetal structural defects are seen. Fetal
 biometry is consistent with her previously-established dates.
 Amniotic fluid is normal and good fetal activity is seen.
 Patient understands the limitations of ultrasound in detecting
 fetal anomalies.

 On cell-free fetal DNA screening, the risks of fetal
 aneuploidies are not increased.

 briefly discussed genetics, partner screening and prenatal
 diagnosis. Recommended genetic counseling.
Recommendations

 -An appointment was made for her to return in 2 weeks for
 genetic counseling.
                 Sekhon, Yuvraj

## 2020-06-15 DIAGNOSIS — F411 Generalized anxiety disorder: Secondary | ICD-10-CM | POA: Diagnosis not present

## 2020-06-15 DIAGNOSIS — F330 Major depressive disorder, recurrent, mild: Secondary | ICD-10-CM | POA: Diagnosis not present

## 2020-06-15 DIAGNOSIS — F4310 Post-traumatic stress disorder, unspecified: Secondary | ICD-10-CM | POA: Diagnosis not present

## 2020-06-20 ENCOUNTER — Other Ambulatory Visit: Payer: Self-pay

## 2020-06-20 ENCOUNTER — Ambulatory Visit: Payer: Medicaid Other | Admitting: Family Medicine

## 2020-06-20 ENCOUNTER — Encounter: Payer: Self-pay | Admitting: Family Medicine

## 2020-06-20 VITALS — BP 102/72 | HR 100 | Wt 126.2 lb

## 2020-06-20 DIAGNOSIS — Z139 Encounter for screening, unspecified: Secondary | ICD-10-CM | POA: Diagnosis not present

## 2020-06-20 DIAGNOSIS — Z7689 Persons encountering health services in other specified circumstances: Secondary | ICD-10-CM

## 2020-06-20 NOTE — Patient Instructions (Addendum)
Thank you for coming to see me today. It was a pleasure  Please follow-up with PCP as needed  If you have any questions or concerns, please do not hesitate to call the office at [phone].  Best,   Dana Allan, MD Family Medicine Residency   Outpatient Mental Health Providers (No Insurance required or Self Pay)  [facility]  [address] [place], [place] [phone] Crisis [phone]  [facility]) can see uninsured folks for outpatient therapy [email or web address] [address] [place], [place] [address] [phone]  [facility]    Walk-in Mon-Fri, 8am-3pm [email or web address] Scull [address], [place], [place]  [phone]   [address]  [place] [phone] [facility] for psych med management, there may be a wait- if [facility] is working with clients for OPT, they will coordinate with [facility] for psych  [facility]   Walk-in-Clinic: Monday- Friday 9:00 AM - 4:00 PM [address]   [place], [place] [phone]  [facility] [place] ([employer]) walk in M-F 8am-12pm and  1pm-3pm [place]- [address]     [phone]  [employer] [address]  Phone: [phone]  [employer] (Mental Health and substance challenges) [address]   [place] [phone]    [email or web address]    [facility]  [place] -[address] [place], [place]     Phone:  [phone] [place]-  [address]  [phone]   [facility]  [address] [place]  [phone] [email or web address]   [facility] ( virtual or zoom therapy) [email or web address]  [address]  [phone]    [facility] [phone]  grief counseling, dementia and caregiver support    [facility] Walk-in MWF 12:30 to 3:00      [address] [place] [address]  [phone]  [email or web address] call to schedule an appointment    Mental Health [facility] Classes ,Support group, Peer support services, [address] [place], [place] [address] [phone]  PhotoSolver.[email or web address]           National Alliance on Mental Illness (NAMI) [place]- Wellness classes, Support groups        [address] [place], [place] [address] [phone]   [email or web address]   [facility]  (Psycho-social Rehabilitation clubhouse, Individual and group therapy) [address] [place], [place] [address]   [phone]  24- Hour Availability:  [facility] [phone] or [phone] * [facility] [employer] (Domestic Violence, Rape, etc. )[phone] Vesta Mixer [phone] or [phone] * [facility] [phone] only) [phone] (after hours) *[facility] Mobile Crisis Unit [phone] *[place] National Suicide Hotline [phone] Len Childs)

## 2020-06-20 NOTE — Progress Notes (Addendum)
° ° °  SUBJECTIVE:   CHIEF COMPLAINT / HPI: establish care  No acute concerns today  Depression/Anxiety/PTSD/ADHD Sees therapist at [facility] Dr Salome Spotted, weekly.   NoSI/HI. Mood good. No currently on medications. Breastfeeding.   PERTINENT  PMH / PSH:  Asthma Depression Anxiety PTSD ADHD  IUD for birth control LMP12/11 No Tobacco,use Quit THC in 2019 Occasional ETOH,not while breastfeeding   OBJECTIVE:   BP 102/72    Pulse 100    Wt 126 lb 3.2 oz (57.2 kg)    SpO2 99%    BMI 23.85 kg/m    General: Alert and oriented, no apparent distress  Eyes: PEERLA ENTM: No pharyengeal erythema Neck: nontender Cardiovascular: RRR with no murmurs noted Respiratory: CTA bilaterally  Gastrointestinal: Bowel sounds present. No abdominal pain MSK: Upper extremity strength 5/5 bilaterally, Lower extremity strength 5/5 bilaterally  Derm: No rashes noted Neuro: CN III-XII intact,motor,sensory and gait wnl Psych: Behavior and speech appropriate to situation  ASSESSMENT/PLAN:   Encounter to establish care with new doctor No acute concerns. PHQ 9 and GAD reviewedFollow with therapist for mental health issues. Declined COVID and Flu vaccine PAP up to date Hep C labs today Follow up as needed      Dana Allan, MD [facility]

## 2020-06-21 LAB — HCV AB W REFLEX TO QUANT PCR: HCV Ab: <0.1 s/co ratio (ref 0.0–0.9)

## 2020-06-21 LAB — HCV INTERPRETATION

## 2020-06-23 ENCOUNTER — Other Ambulatory Visit: Payer: Self-pay

## 2020-06-23 ENCOUNTER — Encounter: Payer: Self-pay | Admitting: Family Medicine

## 2020-06-23 DIAGNOSIS — Z7689 Persons encountering health services in other specified circumstances: Secondary | ICD-10-CM | POA: Insufficient documentation

## 2020-06-23 NOTE — Assessment & Plan Note (Signed)
No acute concerns. PHQ 9 and GAD reviewedFollow with therapist for mental health issues. Declined COVID and Flu vaccine PAP up to date Hep C labs today Follow up as needed

## 2020-06-23 NOTE — Patient Instructions (Signed)
Visit Information  Rose Schwartz was given information about Medicaid Managed Care team care coordination services as a part of their Healthy [facility] Medicaid benefit. Rose Schwartz verbally consented to engagement with the [facility] Managed Care team.   For questions related to your Healthy [facility] health plan, please call: [phone] or visit the homepage here: [email or web address]  If you would like to schedule transportation through your Healthy [facility] plan, please call the following number at least 2 days in advance of your appointment: [phone]  Goals Addressed              This Visit's Progress   .  "I want to get a new job." (pt-stated)        CARE PLAN ENTRY Medicaid Managed Care (see longitudinal plan of care for additional care plan information)  Current Barriers:  . 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  . Patient has been applying for at home employment so that she will be able to care for her infant son, but attempts have been unsuccessful so far.  Clinical Social Work Clinical Goal(s):  Marland Kitchen Over the next 90 days, patient will work with SW to address concerns related to employment difficulties.  Interventions: . Inter-disciplinary care team collaboration (see longitudinal plan of care) . Patient interviewed and appropriate assessments performed . Provided mental health counseling with regard to ADHD, depression, PTSD and bipolar disorder and how symptoms can look alike in various diagnoses.   . Provided patient with information about using caution regarding searching for at home employment. . Discussed plans with patient for ongoing care management follow up and provided patient with direct contact information for care management team . Collaborated with BSW re: employment resources . Patient stated she has started the process of going  back to work at a daycare. Patient does not need any other resources at this time.   Patient Self Care Activities:  . Patient will continue receiving outpatient therapy at [facility], and will actively participate in sessions. . Patient states she will begin EMDR soon in therapy. . Licensed Clinical Social Worker will follow up with patient on July 12, 2019 @ 1:00pm.  Initial goal documentation         Social Worker will follow up in 45 days.Shaune Leeks

## 2020-06-24 NOTE — Addendum Note (Signed)
Addended by: Manson Passey, Michio Thier on: 06/24/2020 07:02 AM   Modules accepted: Level of Service

## 2020-07-05 DIAGNOSIS — F411 Generalized anxiety disorder: Secondary | ICD-10-CM | POA: Diagnosis not present

## 2020-07-05 DIAGNOSIS — F4310 Post-traumatic stress disorder, unspecified: Secondary | ICD-10-CM | POA: Diagnosis not present

## 2020-07-05 DIAGNOSIS — F330 Major depressive disorder, recurrent, mild: Secondary | ICD-10-CM | POA: Diagnosis not present

## 2020-07-06 DIAGNOSIS — Z20828 Contact with and (suspected) exposure to other viral communicable diseases: Secondary | ICD-10-CM | POA: Diagnosis not present

## 2020-07-11 ENCOUNTER — Other Ambulatory Visit: Payer: Self-pay

## 2020-07-11 ENCOUNTER — Ambulatory Visit: Payer: Self-pay

## 2020-07-11 NOTE — Patient Outreach (Signed)
Medicaid Managed Care Social Work Note  07/11/2020 Name:  Rose Schwartz MRN:  [id number] DOB:  05-09-2001  Rose Schwartz is an 20 y.o. year old female who is a primary patient of Carollee Leitz, MD.  The Medicaid Managed Care Coordination team was consulted for assistance with:  Community Resources   Ms. Huberty was given information about Medicaid Managed CareCoordination services today. Rose Schwartz agreed to services and verbal consent obtained.  Engaged with patient  for by telephone forfollow up visit in response to referral for case management and/or care coordination services.   Assessments/Interventions:  Review of past medical history, allergies, medications, health status, including review of consultants reports, laboratory and other test data, was performed as part of comprehensive evaluation and provision of chronic care management services.  SDOH: (Social Determinant of Health) assessments and interventions performed: SDOH Interventions   Flowsheet Row Most Recent Value  SDOH Interventions   Food Insecurity Interventions Intervention Not Indicated  Housing Interventions Intervention Not Indicated  Stress Interventions Intervention Not Indicated, Provide Counseling  Transportation Interventions Intervention Not Indicated  Depression Interventions/Treatment  Currently on Treatment  [Patient is currently seeing a therapist weekly. She wil begin EMDR soon.]      Advanced Directives Status:  Not addressed in this encounter.  Care Plan                 Allergies  Allergen Reactions  . Other Anaphylaxis and Shortness Of Breath    Tree nuts    Medications Reviewed Today    Reviewed by Netta Neat, LCSW (Social Worker) on 07/11/20 at 43  Med List Status: <None>  Medication Order Taking? Sig Documenting Provider Last Dose Status Informant  albuterol (VENTOLIN HFA) 108 (90 Base) MCG/ACT inhaler [id number] Yes Inhale 2 puffs into the lungs every 4  (four) hours as needed for wheezing or shortness of breath (cough, shortness of breath or wheezing.). Robyn Haber, MD Taking Active Self  Blood Pressure Monitoring (BLOOD PRESSURE KIT) DEVI [id number] No 1 Device by Does not apply route as needed.  Patient not taking: Reported on 07/11/2020   Rasch, Artist Pais, NP Not Taking Active Self  calcium carbonate (TUMS - DOSED IN MG ELEMENTAL CALCIUM) 500 MG chewable tablet [id number]  Chew 2 tablets by mouth 3 (three) times daily as needed for indigestion or heartburn.  Patient not taking: Reported on 06/20/2020   [provider]  Active Self  EPINEPHrine 0.3 mg/0.3 mL IJ SOAJ injection [id number] Yes Inject 0.3 mg into the muscle as needed for anaphylaxis. [provider] Taking Active Self           Med Note Vita Barley Jan 17, 2020 10:51 AM) On hand, if needed  ibuprofen (ADVIL) 600 MG tablet [id number] No Take 1 tablet (600 mg total) by mouth every 6 (six) hours.  Patient not taking: Reported on 07/11/2020   Rise Patience, DO Not Taking Active   Prenatal Vit-Fe Fumarate-FA (PREPLUS) 27-1 MG TABS [id number] No Take 1 tablet by mouth daily.  Patient not taking: No sig reported   Anyanwu, Sallyanne Havers, MD Not Taking Active           Patient Active Problem List   Diagnosis Date Noted  . Encounter to establish care with new doctor 06/23/2020  . Ganglion cyst of dorsum of left wrist 01/04/2020  . Hydronephrosis, bilateral 11/19/2019  . Alpha thalassemia silent carrier 08/04/2019  . 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 Asthma   . Bipolar 1 disorder ([place])     Conditions to be addressed/monitored per PCP order:  Bipolar Disorder  Goals Addressed              This Visit's Progress   .  COMPLETED: "I need help with food until I am approved for SNAP benefits. (pt-stated)   On track     [place] (see longitudinal plan of care for additional care plan  information)  Current Barriers:  . Community resources access barrier: Patient Lacks knowledge of community resource: food assistance resources. . Limited access to food . Financial constraints related to not working since July when she gave birth. Patient, significant other and their 91 month old son reside together. Significant other is supporting the family but patient reports he is having some difficulty. She reported that she applied for SNAP benefits about a week ago but has not received any response regarding her application. She stated that recently her biological father identified himself to her, and he has been assisting financially since that time.   Clinical Social Work Clinical Goal(s):  Marland Kitchen Over the next 30 days, patient will work with Managed Medicaid Care Team to address needs related to food procurement.  Interventions: . Inter-disciplinary care team collaboration (see longitudinal plan of care) . Patient interviewed and appropriate assessments performed . Provided patient with information about food procurement and the length of time that may be required for her application to be reviewed. [name] with BSW re: food procurement. Patient has started to receive SNAP benefits. . BSW provided patient with resources for rental  and utility assistance. [place], [facility] [phone], [place] [phone], and the [facility]. Marland Kitchen Update 05/26/2020: Patient stated they still need assistance with rental and utilities. She contacted [employer], but she stated that the message on the answering machine stated they are not accepting applications at this time. She left a message at DSS and is now waiting to talk with someone there. Marland Kitchen Update 07/11/2020: Patient stated she has received the assistance she needed.  Patient Self Care Activities:  . Patient will work with Managed Medicaid Care Team. . Licensed Clinical Social  Worker will refer to BSW for assistance. Marland Kitchen Update 07/11/2020: Patient states no other services are needed at this time.  Please see past updates related to this goal by clicking on the "Past Updates" button in the selected goal     .  COMPLETED: "I want to be scheduled at [place] for Primary Care." (pt-stated)   On track     [place] (see longitudinal plan of care for additional care plan information)  Current Barriers:  . Patient does not have a PCP. Marland Kitchen Patient needs assistance with appointment scheduling and follow up with community agency, [facility].  Clinical Social Work Clinical Goal(s):  Marland Kitchen Over the next 30 days, patient will work with Managed Medicaid Care Team to address needs related to scheduling appointment with [facility]  Interventions: . Inter-disciplinary care team collaboration (see longitudinal plan of care) . Patient interviewed and appropriate assessments performed . Collaborated with BSW re: scheduling appointment. Marland Kitchen Update 07/11/2020: Patient established care with Carollee Leitz, MD on 06/20/2020.  Patient Self Care Activities:  . Patient will actively participate in scheduling appointment at [facility]. . Patient will work with Managed Medicaid Care Team . Licensed Clinical Social Worker  will refer to BSW for assistance. Marland Kitchen Update 05/26/2020: Patient stated she turned in the intake packet for [facility] and is now waiting to be scheduled. Marland Kitchen Update 07/11/2020: Patient states no other services are needed at this time.  Please see past updates related to this goal by clicking on the "Past Updates" button in the selected goal      .  COMPLETED: "I want to get a new job." (pt-stated)   On track     [place] (see longitudinal plan of care for additional care plan information)  Current Barriers:  . 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 She does  not want to return back to that daycare due to the toxicity.  . Patient has been applying for at home employment so that she will be able to care for her infant son, but attempts have been unsuccessful so far.  Clinical Social Work Clinical Goal(s):  Marland Kitchen Over the next 90 days, patient will work with SW to address concerns related to employment difficulties.  Interventions: . Inter-disciplinary care team collaboration (see longitudinal plan of care) . Patient interviewed and appropriate assessments performed . Provided mental health counseling with regard to ADHD, depression, PTSD and bipolar disorder and how symptoms can look alike in various diagnoses.   . Provided patient with information about using caution regarding searching for at home employment. . Discussed plans with patient for ongoing care management follow up and provided patient with direct contact information for care management team . Collaborated with BSW re: employment resources . Patient stated she has started the process of going back to work at a daycare. Patient does not need any other resources at this time. Marland Kitchen Update 07/11/2020: Patient started to work at [employer].   Patient Self Care Activities:  . Patient will continue receiving outpatient therapy at [place], and will actively participate in sessions. . Patient states she will begin EMDR soon in therapy. . Licensed Clinical Social Worker will follow up with patient on July 12, 2019 @ 1:00pm. . Update 07/11/2020: No further services needed at this time.  Please see past updates related to this goal by clicking on the "Past Updates" button in the selected goal        Follow up:  Patient requests no follow-up at this time.  Plan: The patient has been provided with contact information for the Managed Medicaid care management team and has been advised to call with any health related questions or concerns. The Managed Medicaid care  management team is available to follow up with the patient after provider conversation with the patient regarding recommendation for care management engagement and subsequent re-referral to the care management team.   Date/time of next scheduled Social Work care management/care coordination outreach:  Patient has completed all goals. This case is being closed.  Netta Neat, BSW, MSW, LCSW Social Work Case Freight forwarder - [place]  Direct [place]: [phone]

## 2020-07-11 NOTE — Patient Instructions (Signed)
Visit Information Rose Schwartz, it was a pleasure speaking with you today. Congratulations on completing your goals! It has been a pleasure working with you through your journey. In the future, if you have any needs, please do not hesitate to let your PCP know and she will be able to send a referral for [facility] Managed Care services.  Rose Schwartz was given information about Medicaid Managed Care team care coordination services as a part of their Healthy [facility] Medicaid benefit. Rose Schwartz verbally consented to engagement with the [facility] Managed Care team.   For questions related to your Healthy [facility] health plan, please call: [phone] or visit the homepage here: [email or web address]  If you would like to schedule transportation through your Healthy [facility] plan, please call the following number at least 2 days in advance of your appointment: [phone]  Goals Addressed              This Visit's Progress   .  COMPLETED: "I need help with food until I am approved for SNAP benefits. (pt-stated)   On track     [place] (see longitudinal plan of care for additional care plan information)  Current Barriers:  . Community resources access barrier: Patient Lacks knowledge of community resource: food assistance resources. . Limited access to food . Financial constraints related to not working since July when she gave birth. Patient, significant other and their 83 month old son reside together. Significant other is supporting the family but patient reports he is having some difficulty. She reported that she applied for SNAP benefits about a week ago but has not received any response regarding her application. She stated that recently her biological father identified himself to her, and he has been assisting financially since that time.   Clinical Social Work Clinical Goal(s):  Marland Kitchen Over the next 30 days,  patient will work with Managed Medicaid Care Team to address needs related to food procurement.  Interventions: . Inter-disciplinary care team collaboration (see longitudinal plan of care) . Patient interviewed and appropriate assessments performed . Provided patient with information about food procurement and the length of time that may be required for her application to be reviewed. Rose Schwartz with BSW re: food procurement. Patient has started to receive SNAP benefits. . BSW provided patient with resources for rental  and utility assistance. [place], [facility] [phone], [place] [phone], and the [facility]. Marland Kitchen Update 05/26/2020: Patient stated they still need assistance with rental and utilities. She contacted [employer], but she stated that the message on the answering machine stated they are not accepting applications at this time. She left a message at DSS and is now waiting to talk with someone there. Marland Kitchen Update 07/11/2020: Patient stated she has received the assistance she needed.  Patient Self Care Activities:  . Patient will work with Managed Medicaid Care Team. . Licensed Clinical Social Worker will refer to BSW for assistance. Marland Kitchen Update 07/11/2020: Patient states no other services are needed at this time.  Please see past updates related to this goal by clicking on the "Past Updates" button in the selected goal     .  COMPLETED: "I want to be scheduled at [place] for Primary Care." (pt-stated)   On track     [place] (see longitudinal plan of care for additional care plan information)  Current Barriers:  . Patient does not have  a PCP. . Patient needs assistance with appointment scheduling and follow up with community agency, [facility].  Clinical Social Work Clinical Goal(s):  Marland Kitchen Over the next 30 days, patient will work with Managed Medicaid Care  Team to address needs related to scheduling appointment with [facility].  Interventions: . Inter-disciplinary care team collaboration (see longitudinal plan of care) . Patient interviewed and appropriate assessments performed . Collaborated with BSW re: scheduling appointment. Marland Kitchen Update 07/11/2020: Patient established care with Rose Allan, MD on 06/20/2020.  Patient Self Care Activities:  . Patient will actively participate in scheduling appointment at [facility]. . Patient will work with Managed Medicaid Care Team . Licensed Clinical Social Worker will refer to BSW for assistance. Marland Kitchen Update 05/26/2020: Patient stated she turned in the intake packet for [facility] and is now waiting to be scheduled. Marland Kitchen Update 07/11/2020: Patient states no other services are needed at this time.  Please see past updates related to this goal by clicking on the "Past Updates" button in the selected goal      .  COMPLETED: "I want to get a new job." (pt-stated)   On track     CARE PLAN ENTRY Medicaid Managed Care (see longitudinal plan of care for additional care plan information)  Current Barriers:  . 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  . Patient has been applying for at home employment so that she will be able to care for her infant son, but attempts have been unsuccessful so far.  Clinical Social Work Clinical Goal(s):  Marland Kitchen Over the next 90 days, patient will work with SW to address concerns related to employment difficulties.  Interventions: . Inter-disciplinary care team collaboration (see longitudinal plan of care) . Patient interviewed and appropriate assessments performed . Provided mental health counseling with regard to ADHD, depression, PTSD and bipolar disorder and how symptoms can look alike in various diagnoses.   . Provided patient with information about using caution regarding  searching for at home employment. . Discussed plans with patient for ongoing care management follow up and provided patient with direct contact information for care management team . Collaborated with BSW re: employment resources . Patient stated she has started the process of going back to work at a daycare. Patient does not need any other resources at this time. Marland Kitchen Update 07/11/2020: Patient started to work at [employer].   Patient Self Care Activities:  . Patient will continue receiving outpatient therapy at [facility], and will actively participate in sessions. . Patient states she will begin EMDR soon in therapy. . Licensed Clinical Social Worker will follow up with patient on July 12, 2019 @ 1:00pm. . Update 07/11/2020: No further services needed at this time.  Please see past updates related to this goal by clicking on the "Past Updates" button in the selected goal        The patient verbalized understanding of instructions provided today and agreed to receive a mailed copy of patient instruction and/or educational materials.  No further follow up required: If Medicaid Managed Care sevices are needed in the future, please contact your PCP.  This case is being closed, as discussed during our conversation earlier today. Thank you, and take good care of yourself.  [name], LCSW [name], BSW, MSW, LCSW Social Work Case Production designer, theatre/television/film - Medicaid Managed Care [facility]  [facility]  Warehouse manager: [phone]

## 2020-07-12 ENCOUNTER — Ambulatory Visit (INDEPENDENT_AMBULATORY_CARE_PROVIDER_SITE_OTHER): Payer: Medicaid Other | Admitting: Surgical

## 2020-07-12 DIAGNOSIS — Z1159 Encounter for screening for other viral diseases: Secondary | ICD-10-CM | POA: Diagnosis not present

## 2020-07-12 DIAGNOSIS — M67432 Ganglion, left wrist: Secondary | ICD-10-CM

## 2020-07-12 DIAGNOSIS — M654 Radial styloid tenosynovitis [de Quervain]: Secondary | ICD-10-CM

## 2020-07-12 NOTE — Progress Notes (Signed)
No show

## 2020-07-19 DIAGNOSIS — F330 Major depressive disorder, recurrent, mild: Secondary | ICD-10-CM | POA: Diagnosis not present

## 2020-07-19 DIAGNOSIS — F4310 Post-traumatic stress disorder, unspecified: Secondary | ICD-10-CM | POA: Diagnosis not present

## 2020-07-19 DIAGNOSIS — F411 Generalized anxiety disorder: Secondary | ICD-10-CM | POA: Diagnosis not present

## 2020-08-04 ENCOUNTER — Other Ambulatory Visit: Payer: Self-pay

## 2020-08-04 ENCOUNTER — Inpatient Hospital Stay (HOSPITAL_COMMUNITY)
Admission: AD | Admit: 2020-08-04 | Discharge: 2020-08-04 | Disposition: A | Discharge status: Home / Self Care | Payer: Medicaid Other | Attending: Family Medicine | Admitting: Family Medicine

## 2020-08-04 DIAGNOSIS — Z30431 Encounter for routine checking of intrauterine contraceptive device: Secondary | ICD-10-CM | POA: Diagnosis not present

## 2020-08-04 NOTE — MAU Note (Signed)
Presents with c/o lower abdominal pain.  Reports thinks she may feel her IUD and was told to be evaluated within 24 hours.  Vaginal delivery July 2021.

## 2020-08-04 NOTE — MAU Provider Note (Signed)
S Ms. Rose Schwartz is a 20 y.o. G1P1001 patient who presents to MAU today with complaint of IUD pain. This has been ongoing for "a while."   O BP 120/67 (BP Location: Right Arm)   Temp 98.6 F (37 C) (Oral)   Resp 20   Ht 5\' 1"  (1.549 m)   Wt 58 kg   SpO2 100%   BMI 24.15 kg/m  Physical Exam Vitals and nursing note reviewed.  Constitutional:      General: She is not in acute distress.    Appearance: She is well-developed and well-nourished.  HENT:     Head: Normocephalic.  Eyes:     Pupils: Pupils are equal, round, and reactive to light.  Cardiovascular:     Rate and Rhythm: Normal rate and regular rhythm.     Heart sounds: Normal heart sounds.  Pulmonary:     Effort: Pulmonary effort is normal. No respiratory distress.     Breath sounds: Normal breath sounds.  Abdominal:     General: Bowel sounds are normal. There is no distension.     Palpations: Abdomen is soft.     Tenderness: There is no abdominal tenderness.  Skin:    General: Skin is warm and dry.  Neurological:     Mental Status: She is alert and oriented to person, place, and time.  Psychiatric:        Mood and Affect: Mood and affect normal.        Behavior: Behavior normal.        Thought Content: Thought content normal.        Judgment: Judgment normal.     A Medical screening exam complete @DX @  P Discharge from MAU in stable condition Patient given the option of transfer to [facility] for further evaluation or seek care in outpatient facility of choice  List of options for follow-up given. Message sent to [facility] to schedule for gyn appointment as soon as possible.  Warning signs for worsening condition that would warrant emergency follow-up discussed Patient may return to MAU as needed   [facility] 08/04/2020 5:50 PM

## 2020-08-07 ENCOUNTER — Telehealth: Payer: Self-pay | Admitting: Medical

## 2020-08-07 NOTE — Telephone Encounter (Signed)
Returning a call to patient about IUD pain, and bleeding. She has a question about what to do. She may need ultrasound to see if it's still placed in the right place.

## 2020-08-08 NOTE — Telephone Encounter (Signed)
Called pt and pt informed me that she had a sharp shooting pain that came when she was attempting to pick up something.  I advised pt that it is not abnormal to have intermittent pain as such that may not be related her to IUD.  I explained that improper use of body mechanics can cause pain as well.  I explained to the patient it is also not abnormal to have irregular bleeding with the IUD is as well.  I advised pt to continue to monitor her symptoms and if she starts having bleeding that she is saturating a pad an hour or constant pain that is not relieved with Naproxen or pain medication to please call the office.  Pt verbalized understanding.   Addison Naegeli, RN  08/08/20

## 2020-08-09 ENCOUNTER — Ambulatory Visit (INDEPENDENT_AMBULATORY_CARE_PROVIDER_SITE_OTHER): Payer: Medicaid Other | Admitting: Obstetrics & Gynecology

## 2020-08-09 ENCOUNTER — Other Ambulatory Visit: Payer: Self-pay

## 2020-08-09 ENCOUNTER — Encounter: Payer: Self-pay | Admitting: Obstetrics & Gynecology

## 2020-08-09 VITALS — BP 124/71 | HR 73 | Ht 61.0 in | Wt 128.3 lb

## 2020-08-09 DIAGNOSIS — M6289 Other specified disorders of muscle: Secondary | ICD-10-CM | POA: Diagnosis not present

## 2020-08-09 DIAGNOSIS — Z30431 Encounter for routine checking of intrauterine contraceptive device: Secondary | ICD-10-CM | POA: Diagnosis not present

## 2020-08-09 NOTE — Progress Notes (Unsigned)
Here because having pain since IUD placed in July 2021. States has been having pain since then but it got worse/ changed a month or so. Wants to be sure it is ok. Elevated phq9 and gad 7- is seeing a provider for that and denies need to see Asher Muir. Reynold Mantell,RN

## 2020-08-11 NOTE — Progress Notes (Signed)
GYNECOLOGY  VISIT  CC:   Pelvic cramping, pain since delivery/IUD placement  HPI: 20 y.o. G1P1001 Single Black or African American female here for complaint of pelvic pian/cramping that has been present since the delivery of her son in July, 2021.  IUD was placed after delivery.  Cramping/pain is down low and sometimes on the left side.  Denies any urinary symptoms or constipation.  She doesn't have pain with intercourse.  Does not bleed.  Is still breast feeding.  Pt is concerned pain is related to presence of IUD.  She has contemplated having it removed but likes the ease of use.  Would like to know if IUD is in correct location.  GYNECOLOGIC HISTORY: No LMP recorded. (Menstrual status: Lactating). Contraception: IUD  Patient Active Problem List   Diagnosis Date Noted  . Encounter to establish care with new doctor 06/23/2020  . Ganglion cyst of dorsum of left wrist 01/04/2020  . Hydronephrosis, bilateral 11/19/2019  . Alpha thalassemia silent carrier 08/04/2019  . 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 Asthma   . Bipolar 1 disorder ([place])     Past Medical History:  Diagnosis Date  . Anemia   . Asthma   . Bipolar 1 disorder ([place])   . UTI (urinary tract infection)     Past Surgical History:  Procedure Laterality Date  . CYST EXCISION Left 03/21/2020   Procedure: Left wrist ganglion cyst excision;  Surgeon: Cindra Presume, MD;  Location: [facility];  Service: Plastics;  Laterality: Left;  total case time is 1 hour  . FETAL AMNIOTIC BAND RELEASE    . TRIGGER FINGER RELEASE Left 03/21/2020   Procedure: first dorsal extensor compartment release;  Surgeon: Cindra Presume, MD;  Location: [facility];  Service: Plastics;  Laterality: Left;    MEDS:   Current Outpatient Medications on File Prior to Visit  Medication Sig Dispense Refill  . albuterol (VENTOLIN HFA) 108 (90 Base) MCG/ACT inhaler Inhale 2 puffs into the lungs every 4  (four) hours as needed for wheezing or shortness of breath (cough, shortness of breath or wheezing.). 1 Inhaler 1  . calcium carbonate (TUMS - DOSED IN MG ELEMENTAL CALCIUM) 500 MG chewable tablet Chew 2 tablets by mouth 3 (three) times daily as needed for indigestion or heartburn.    Marland Kitchen ibuprofen (ADVIL) 600 MG tablet Take 1 tablet (600 mg total) by mouth every 6 (six) hours. 30 tablet 0  . levalbuterol (XOPENEX HFA) 45 MCG/ACT inhaler SMARTSIG:By Mouth    . Blood Pressure Monitoring (BLOOD PRESSURE KIT) DEVI 1 Device by Does not apply route as needed. 1 each 0  . EPINEPHrine 0.3 mg/0.3 mL IJ SOAJ injection Inject 0.3 mg into the muscle as needed for anaphylaxis. (Patient not taking: Reported on 08/09/2020)    . Prenatal Vit-Fe Fumarate-FA (PREPLUS) 27-1 MG TABS Take 1 tablet by mouth daily. 30 tablet 6   No current facility-administered medications on file prior to visit.    ALLERGIES: Other  Family History  Problem Relation Age of Onset  . Fibromyalgia Mother     SH:  Single, non smoker  Review of Systems  Genitourinary: Positive for pelvic pain.  All other systems reviewed and are negative.   PHYSICAL EXAMINATION:    BP 124/71   Pulse 73   Ht '5\' 1"'  (1.549 m)   Wt 128 lb 4.8 oz (58.2 kg)   BMI 24.24 kg/m     General appearance: alert, cooperative  and appears stated age Abdomen: soft, non-tender; bowel sounds normal; no masses,  no organomegaly Lymph:  no inguinal LAD noted  Pelvic: External genitalia:  no lesions              Urethra:  normal appearing urethra with no masses, tenderness or lesions              Bartholins and Skenes: normal                 Vagina: normal appearing vagina with normal color and discharge, no lesions              Cervix: no lesions and IUD string noted              Bimanual Exam:  Uterus:  normal size, contour, position, consistency, mobility, non-tender              Adnexa: no mass, fullness, tenderness  Tenderness to palpation of left pelvic  floor that reproduces her pain.  Chaperone was present for exam.  Assessment/Plan: 1. Pelvic floor dysfunction - Ambulatory referral to Physical Therapy  2. IUD check up -Pt reassured about IUD placement with string 2cm in length and normal exam.  I feel this is more likely pelvic floor issues however if pain does not significantly improve with pelvic PT would then recommend PUS to assess IUD placement.  Pt very comfortable with plan.  23 minutes of total time was spent for this patient encounter, including preparation, face-to-face counseling with the patient and coordination of care, and documentation of the encounter.

## 2020-08-13 DIAGNOSIS — Z20822 Contact with and (suspected) exposure to covid-19: Secondary | ICD-10-CM | POA: Diagnosis not present

## 2020-08-14 ENCOUNTER — Ambulatory Visit: Payer: Medicaid Other

## 2020-08-31 ENCOUNTER — Telehealth: Payer: Self-pay | Admitting: Family Medicine

## 2020-08-31 NOTE — Telephone Encounter (Signed)
Patient said she haven't heard anything about her referral for PHYSICAL THERAPY

## 2020-08-31 NOTE — Telephone Encounter (Signed)
Called [facility] and LM for them to call the patient to schedule.   Called patient and she did not answer. LM that I had called [facility] ans asked them to call her. Gave her the phone number of [phone] to reach out to the PT office also.

## 2020-09-06 DIAGNOSIS — F411 Generalized anxiety disorder: Secondary | ICD-10-CM | POA: Diagnosis not present

## 2020-09-06 DIAGNOSIS — F4310 Post-traumatic stress disorder, unspecified: Secondary | ICD-10-CM | POA: Diagnosis not present

## 2020-09-06 DIAGNOSIS — F330 Major depressive disorder, recurrent, mild: Secondary | ICD-10-CM | POA: Diagnosis not present

## 2020-09-20 DIAGNOSIS — F411 Generalized anxiety disorder: Secondary | ICD-10-CM | POA: Diagnosis not present

## 2020-09-20 DIAGNOSIS — F330 Major depressive disorder, recurrent, mild: Secondary | ICD-10-CM | POA: Diagnosis not present

## 2020-09-20 DIAGNOSIS — F4310 Post-traumatic stress disorder, unspecified: Secondary | ICD-10-CM | POA: Diagnosis not present

## 2020-09-27 DIAGNOSIS — F411 Generalized anxiety disorder: Secondary | ICD-10-CM | POA: Diagnosis not present

## 2020-09-27 DIAGNOSIS — F330 Major depressive disorder, recurrent, mild: Secondary | ICD-10-CM | POA: Diagnosis not present

## 2020-09-27 DIAGNOSIS — F4310 Post-traumatic stress disorder, unspecified: Secondary | ICD-10-CM | POA: Diagnosis not present

## 2020-10-03 ENCOUNTER — Ambulatory Visit: Payer: Medicaid Other

## 2020-10-05 ENCOUNTER — Ambulatory Visit: Payer: Medicaid Other

## 2020-10-09 ENCOUNTER — Ambulatory Visit (INDEPENDENT_AMBULATORY_CARE_PROVIDER_SITE_OTHER): Payer: Medicaid Other | Admitting: Family Medicine

## 2020-10-09 ENCOUNTER — Other Ambulatory Visit: Payer: Self-pay

## 2020-10-09 VITALS — BP 122/60 | HR 86 | Ht 61.0 in | Wt 128.0 lb

## 2020-10-09 DIAGNOSIS — Z91018 Allergy to other foods: Secondary | ICD-10-CM | POA: Diagnosis not present

## 2020-10-09 DIAGNOSIS — L308 Other specified dermatitis: Secondary | ICD-10-CM

## 2020-10-09 DIAGNOSIS — R6884 Jaw pain: Secondary | ICD-10-CM

## 2020-10-09 DIAGNOSIS — M654 Radial styloid tenosynovitis [de Quervain]: Secondary | ICD-10-CM | POA: Diagnosis not present

## 2020-10-09 DIAGNOSIS — L309 Dermatitis, unspecified: Secondary | ICD-10-CM

## 2020-10-09 DIAGNOSIS — J3089 Other allergic rhinitis: Secondary | ICD-10-CM | POA: Diagnosis not present

## 2020-10-09 DIAGNOSIS — R350 Frequency of micturition: Secondary | ICD-10-CM | POA: Diagnosis not present

## 2020-10-09 DIAGNOSIS — J3081 Allergic rhinitis due to animal (cat) (dog) hair and dander: Secondary | ICD-10-CM | POA: Diagnosis not present

## 2020-10-09 DIAGNOSIS — J301 Allergic rhinitis due to pollen: Secondary | ICD-10-CM | POA: Diagnosis not present

## 2020-10-09 LAB — POCT URINALYSIS DIP (MANUAL ENTRY)
Bilirubin, UA: NEGATIVE
Blood, UA: NEGATIVE
Glucose, UA: NEGATIVE mg/dL
Ketones, POC UA: NEGATIVE mg/dL
Leukocytes, UA: NEGATIVE
Nitrite, UA: NEGATIVE
Protein Ur, POC: NEGATIVE mg/dL
Spec Grav, UA: 1.025 (ref 1.010–1.025)
Urobilinogen, UA: 0.2 E.U./dL
pH, UA: 7 (ref 5.0–8.0)

## 2020-10-09 MED ORDER — TRIAMCINOLONE ACETONIDE 0.1 % EX OINT: 1.0000 "application " | TOPICAL_OINTMENT | Freq: Two times a day (BID) | CUTANEOUS | 0 refills | Status: DC

## 2020-10-09 MED ORDER — IBUPROFEN 600 MG PO TABS: 600.0000 mg | ORAL_TABLET | Freq: Four times a day (QID) | ORAL | 0 refills | Status: AC

## 2020-10-09 NOTE — Progress Notes (Addendum)
   SUBJECTIVE:   CHIEF COMPLAINT / HPI:   Chief Complaint  Patient presents with  . Hand Pain    Left side of jaw as well     Rose Schwartz is a 20 y.o. female here for hand and jaw pain.  Pt reports bilateral hand (left worse than right) and left jaw pain. States she wakes up and pain is present. Pain goes away by mid day.  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 She had a ganglionic cyst surgery last year. She noticed the pain last month but may have been going longer. Feels like the pain is getting worse.  Denies fever, neck pain, back pain. Occasional shoulder pain. Tried nothing for pain as she does not like to take pills. Mom has RA and fibromyalgia.   Left jaw pain described as cracking when she opens her mouth. Pain worse chewing. No recent dental work as she has not gone to the dentist since the third grade.  Pt reports frequent urination for past last week. Has been drinking more fluids. Denies urgent urination and dysuria. IUD placed weeks after her now 54 month old son was born. Has intermittent bleeding since placement. Pt has pelvic floor dysfunction.    PERTINENT  PMH / PSH: reviewed and updated as appropriate   OBJECTIVE:   BP 122/60   Pulse 86   Ht 5\' 1"  (1.549 m)   Wt 128 lb (58.1 kg)   SpO2 96%   BMI 24.19 kg/m    GEN: pleasant well appearing female, in no acute distress  CV: regular rate and rhythm RESP: no increased work of breathing, clear to ascultation bilaterally  MSK: hands: Inspection yielded no erythema, ecchymosis, or swelling. Deformity of right 3rd and 4th digits (amniotic bands), ROM full active and passive with good flexion and extension and ulnar/radial deviation that is symmetrical with opposite wrist. Palpation is normal over metacarpals, scaphoid; thumb tendons with tenderness at the base of the thumb. Strength 5/5 in all directions without pain. Positive Finkelstein left > wrist.  SKIN: warm, dry, flaky  hyperpigmented patches on bilateral hands     ASSESSMENT/PLAN:   Frequent urination UA unremarkable for infection. No dysuria. No glucosuria or ketonuria, doubt new onset T1DM. Pt has IUD, doubt pregnancy. Sx likely due to increased hydration.   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 She had dorsal compartment release and ganglionic cyst removed in Sept 2021. Left wrist brace with thumb spica ordered. Pt to [facility] up from [facility] medicine center. Advised pt to follow up with surgeon who performed her dorsal compartment release last year. Contact information offered but was not needed. Patient agrees with plan.   Jaw pain No trismus. Supspect dental related pain vs TMJ. Pt to take NSAIDs and follow up with area dentist as she has not been seen since in about 10 years. Dentist may offer intraarticular injection for TMJ. Pt agrees with this plan     Pt to follow up with GYN regarding IUD related intermittent bleeding and pelvic floor dysfunction.    04-08-2000, DO PGY-2, [facility] 10/09/2020

## 2020-10-09 NOTE — Patient Instructions (Addendum)
It was great seeing you today!  Visit Remembers: - Stop by the pharmacy to pick up your prescriptions  - Continue to work on your healthy eating habits and incorporating exercise into your daily life.  Stop by the [facility] to pick up your wrist brace. Be sure to wear 24 hours a day.  [facility] [address] [place] [phone]  For pain: Call Dr. Thomos Lemons office about your left wrist pain. Schedule an appointment. Take 600 mg Ibuprofen up to three times a day.  [facility]  Address: [address], [place], [place] [address] Phone: [phone]  For your intermittent bleeding: Call [facility] to get IUD checked.     If you haven't already, sign up for My Chart to have easy access to your labs results, and communication with your primary care physician.  Feel free to call with any questions or concerns at any time, at [phone].   Take care,  Dr. Katherina [facility]

## 2020-10-11 DIAGNOSIS — R350 Frequency of micturition: Secondary | ICD-10-CM

## 2020-10-11 DIAGNOSIS — F411 Generalized anxiety disorder: Secondary | ICD-10-CM | POA: Diagnosis not present

## 2020-10-11 DIAGNOSIS — L309 Dermatitis, unspecified: Secondary | ICD-10-CM | POA: Insufficient documentation

## 2020-10-11 DIAGNOSIS — F4310 Post-traumatic stress disorder, unspecified: Secondary | ICD-10-CM | POA: Diagnosis not present

## 2020-10-11 DIAGNOSIS — F330 Major depressive disorder, recurrent, mild: Secondary | ICD-10-CM | POA: Diagnosis not present

## 2020-10-11 DIAGNOSIS — M654 Radial styloid tenosynovitis [de Quervain]: Secondary | ICD-10-CM | POA: Insufficient documentation

## 2020-10-11 DIAGNOSIS — R6884 Jaw pain: Secondary | ICD-10-CM | POA: Insufficient documentation

## 2020-10-11 HISTORY — DX: Frequency of micturition: R35.0

## 2020-10-11 NOTE — Assessment & Plan Note (Signed)
No trismus. Supspect dental related pain vs TMJ. Pt to take NSAIDs and follow up with area dentist as she has not been seen since in about 10 years. Dentist may offer intraarticular injection for TMJ. Pt agrees with this plan

## 2020-10-11 NOTE — Assessment & Plan Note (Signed)
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 She had dorsal compartment release and ganglionic cyst removed in Sept 2021. Left wrist brace with thumb spica ordered. Pt to pick up from sports medicine center. Advised pt to follow up with surgeon who performed her dorsal compartment release last year. Contact information offered but was not needed. Patient agrees with plan.

## 2020-10-11 NOTE — Assessment & Plan Note (Signed)
UA unremarkable for infection. No dysuria. No glucosuria or ketonuria, doubt new onset T1DM. Pt has IUD, doubt pregnancy. Sx likely due to increased hydration.

## 2020-10-18 DIAGNOSIS — F330 Major depressive disorder, recurrent, mild: Secondary | ICD-10-CM | POA: Diagnosis not present

## 2020-10-18 DIAGNOSIS — F411 Generalized anxiety disorder: Secondary | ICD-10-CM | POA: Diagnosis not present

## 2020-10-18 DIAGNOSIS — F4310 Post-traumatic stress disorder, unspecified: Secondary | ICD-10-CM | POA: Diagnosis not present

## 2020-11-07 ENCOUNTER — Encounter: Payer: Medicaid Other | Admitting: Physical Therapy

## 2020-11-13 ENCOUNTER — Telehealth: Payer: Self-pay | Admitting: Lactation Services

## 2020-11-13 NOTE — Telephone Encounter (Signed)
Patient called and LM on nurse voicemail that she is experiencing pain in her pelvic region. She would like a call back.   Returned patients call. Patient reports that she is having pelvic pain that has happened previously. She was told she has pelvic floor dysfunction. Patient is supposed to be set up with Physical Therapy, she does have an appointment on 5/25 and 5/31.   She reports the pain started yesterday afternoon around 4 pm up to a 10/10, today she was at a 6.5 this morning, no pain currently. Pain is located in the pelvic region on both sides, primarily the right side.   Patient reports she is having irregular spotting. She is not sure where she would be in her cycle.   She took Tylenol and it did not help yesterday. Reviewed trying Ibuprofen 400 mg x 6 hours for 24 hours.   She is having scant bleeding currently when she wipes. She does not have a history of Cysts that she is aware of.   Patient with IUD and she is not able to feel her string, she does not check for them.  When seen in the office on 2/4, strings were visualized.   Reviewed with patient that it is recommended that she follow through with PT and then follow up with [place] if pain continues. Patient reports she would like to be reevaluated in the office. Message to front office to call patient to schedule.

## 2020-11-15 DIAGNOSIS — F411 Generalized anxiety disorder: Secondary | ICD-10-CM | POA: Diagnosis not present

## 2020-11-15 DIAGNOSIS — F330 Major depressive disorder, recurrent, mild: Secondary | ICD-10-CM | POA: Diagnosis not present

## 2020-11-15 DIAGNOSIS — F4310 Post-traumatic stress disorder, unspecified: Secondary | ICD-10-CM | POA: Diagnosis not present

## 2020-11-28 ENCOUNTER — Encounter: Payer: Self-pay | Admitting: Physical Therapy

## 2020-11-28 ENCOUNTER — Encounter: Payer: Medicaid Other | Attending: Obstetrics & Gynecology | Admitting: Physical Therapy

## 2020-11-28 ENCOUNTER — Other Ambulatory Visit: Payer: Self-pay

## 2020-11-28 DIAGNOSIS — R102 Pelvic and perineal pain: Secondary | ICD-10-CM

## 2020-11-28 DIAGNOSIS — R252 Cramp and spasm: Secondary | ICD-10-CM

## 2020-11-28 DIAGNOSIS — R278 Other lack of coordination: Secondary | ICD-10-CM

## 2020-11-28 NOTE — Patient Instructions (Addendum)
  External Perineal Massage and Release Techniques for PFD (for Women and Men) by fem fusion  5 Minute Pelvic Floor Release - Relax Pelvic Tension FAST! By fem fusion  Eulis Foster, PT [facility] [address] [place], [place] [address] W: [phone] [email or web address]

## 2020-11-28 NOTE — Therapy (Signed)
[facility] [address] [place], [place], [address] Phone: [phone]   Fax:  [phone]  Physical Therapy Evaluation  Patient Details  Name: RAMISA DUMAN MRN: [id number] Date of Birth: 2000-11-29 Referring Provider (PT): Dr. Valentina Shaggy   Encounter Date: 11/28/2020   PT End of Session - 11/28/20 1535    Visit Number 1    Date for PT Re-Evaluation 03/31/21    Authorization Type [employer]    PT Start Time 1529   came 29 minutes late   PT Stop Time 1555    PT Time Calculation (min) 26 min    Activity Tolerance Patient tolerated treatment well    Behavior During Therapy [facility] for tasks assessed/performed           Past Medical History:  Diagnosis Date  . Anemia   . Asthma   . Bipolar 1 disorder ([facility])   . UTI (urinary tract infection)     Past Surgical History:  Procedure Laterality Date  . CYST EXCISION Left 03/21/2020   Procedure: Left wrist ganglion cyst excision;  Surgeon: [name], MD;  Location: [facility];  Service: Plastics;  Laterality: Left;  total case time is 1 hour  . FETAL AMNIOTIC BAND RELEASE    . TRIGGER FINGER RELEASE Left 03/21/2020   Procedure: first dorsal extensor compartment release;  Surgeon: [name], MD;  Location: [facility];  Service: Plastics;  Laterality: Left;    There were no vitals filed for this visit.    Subjective Assessment - 11/28/20 1531    Subjective Patietn reports she is getting a sharp pain around the pelvic area. She has to stop what she is doing. Patient reports this has been going on 07/08/2020. Sudden onset.    Patient Stated Goals reduction of pain with intercourse    Currently in Pain? Yes    Pain Score 7     Pain Location Pelvis    Pain Orientation Mid;Lower    Pain Descriptors / Indicators Sharp;Throbbing;Pressure    Pain Type Acute pain    Pain Onset More than a month ago    Pain Frequency  Intermittent    Aggravating Factors  penile penetration vaginally; holding urine too long; walking; reaching sown from a standing position    Pain Relieving Factors breath    Multiple Pain Sites No              [facility] PT Assessment - 11/28/20 0001      Assessment   Medical Diagnosis M62.89 Pelvic floor dysfunction    Referring Provider (PT) Dr. Valentina Shaggy    Onset Date/Surgical Date 07/08/20    Prior Therapy none      Precautions   Precautions None      Restrictions   Weight Bearing Restrictions No      Balance Screen   Has the patient fallen in the past 6 months No    Has the patient had a decrease in activity level because of a fear of falling?  No    Is the patient reluctant to leave their home because of a fear of falling?  No      Home Tourist information centre manager residence      Prior Function   Level of Independence Independent    Vocation Full time employment    Vocation Requirements lifting, standing      Cognition   Overall Cognitive Status  Within Functional Limits for tasks assessed      Observation/Other Assessments   Focus on Therapeutic Outcomes (FOTO)  UIQ-7 33; CRAIQ-7 5; POPIQ-7 36; PFIQ-7 76      Posture/Postural Control   Posture/Postural Control No significant limitations      ROM / Strength   AROM / PROM / Strength AROM;PROM;Strength      AROM   Overall AROM Comments FULL LUMBAR rom      Palpation   SI assessment  LEFT asis SHALLOW and post. rotated; decreased springing of the left SI joint    Palpation comment tender in the lower abdomen                      Objective measurements completed on examination: See above findings.     Pelvic Floor Special Questions - 11/28/20 0001    Prior Pregnancies Yes    Number of Vaginal Deliveries 1    Diastasis Recti none    Currently Sexually Active Yes    Is this Painful Yes   deeper penetration   Urinary Leakage Yes    Pad use wears a pantyliner    Activities that  cause leaking Laughing;Coughing    Fecal incontinence No    Skin Integrity Intact    Perineal Body/Introitus  Elevated    External Palpation tendern on the perineal body and ischiocavernosus    Pelvic Floor Internal Exam Patient confirms identificaiton adn approves PT to assess pelvic floor and treatment    Exam Type Vaginal    Palpation tenderness located on the perineal body, puborecalis, pubovaginalis    Strength Flicker    Tone increased tone                    PT Education - 11/28/20 1710    Education Details educated on how to massage the perineum to relax the pelvic floor    Person(s) Educated Patient    Methods Explanation;Demonstration    Comprehension Verbalized understanding            PT Short Term Goals - 11/28/20 1716      PT SHORT TERM GOAL #1   Title independent with initial HEP    Time 4    Period Weeks    Status New    Target Date 12/26/20             PT Long Term Goals - 11/28/20 1717      PT LONG TERM GOAL #1   Title independent with advanced HEP    Baseline not educated yet    Time 4    Period Months    Status New    Target Date 03/31/21      PT LONG TERM GOAL #2   Title able to have penile penetration vaginally with pain level <.= 1-2/10 due to elongation of the pelvic floor muscles    Baseline pain level 9/10    Time 4    Period Months    Status New    Target Date 03/31/21      PT LONG TERM GOAL #3   Title report urinary leakage is 80% improved due to increaed pelvic floor strength >/= 3/5    Baseline pelvic floor strength is 1/5    Time 4    Period Months    Status New    Target Date 03/31/21      PT LONG TERM GOAL #4   Title able to perfrom daily activities with  pain level </= 2-3/10 due to reduction of tone in the pelvic floor    Baseline pain level is 9/10    Time 4    Period Months    Status New    Target Date 03/31/21                  Plan - 11/28/20 1537    Clinical Impression Statement Patient  is a 20 year old female with sudden pelvic floor pain since 07/2020. She reports her intermittent pain is 9/10 with penile penetration vaginally, walking, standing and bending forward, and holding urine for too long. She has full lumbar ROM. Tenderness located in the lower abdominal, ischiocavernosus, puborectalis, pubovainalis and perineal body. Her pelvic floor strength is 1/5 and hyper tonic. Her left ilium is rotated posteriorly and her pelvis is rotated to the right. Shen she bulges the pelvic floor whe will contract the vaginal muscles. . Patient reports she will leak uine with coughing, and sneezing. Patient will benefit from skilled therapy to reduce muscle tension to decrease her pain and reduce the urinary leakage.    Personal Factors and Comorbidities Sex;Comorbidity 1    Comorbidities Bipolar    Examination-Activity Limitations Lift;Continence;Toileting    Examination-Participation Restrictions Interpersonal Relationship    Stability/Clinical Decision Making Stable/Uncomplicated    Clinical Decision Making Low    Rehab Potential Excellent    PT Frequency 1x / week    PT Duration Other (comment)   4 months   PT Treatment/Interventions ADLs/Self Care Home Management;Biofeedback;Electrical Stimulation;Moist Heat;Cryotherapy;Therapeutic activities;Therapeutic exercise;Neuromuscular re-education;Patient/family education;Manual techniques;Dry needling;Spinal Manipulations    PT Next Visit Plan hip stretches; manual work to lower abdomen, outside pelvic floor and internally, diaphragmatic breathing    Consulted and Agree with Plan of Care Patient           Patient will benefit from skilled therapeutic intervention in order to improve the following deficits and impairments:  Decreased activity tolerance,Decreased strength,Increased fascial restricitons,Increased muscle spasms,Decreased coordination  Visit Diagnosis: Cramp and spasm - Plan: PT plan of care cert/re-cert  Other lack of  coordination - Plan: PT plan of care cert/re-cert  Pelvic pain - Plan: PT plan of care cert/re-cert     Problem List Patient Active Problem List   Diagnosis Date Noted  . Frequent urination 10/11/2020  . De Quervain's tenosynovitis, left 10/11/2020  . Eczema 10/11/2020  . Jaw pain 10/11/2020  . Encounter to establish care with new doctor 06/23/2020  . Ganglion cyst of dorsum of left wrist 01/04/2020  . Alpha thalassemia silent carrier 08/04/2019  . 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 Asthma   . Bipolar 1 disorder (H[facility])     Eulis Foster, PT 11/28/20 5:21 PM   [facility] [address], [address] [place], [place], [address] Phone: [phone]   Fax:  [phone]  Name: ADRIANN THAU MRN: [id number] Date of Birth: 2000-09-24

## 2020-11-29 DIAGNOSIS — F4310 Post-traumatic stress disorder, unspecified: Secondary | ICD-10-CM | POA: Diagnosis not present

## 2020-11-29 DIAGNOSIS — F330 Major depressive disorder, recurrent, mild: Secondary | ICD-10-CM | POA: Diagnosis not present

## 2020-11-29 DIAGNOSIS — F411 Generalized anxiety disorder: Secondary | ICD-10-CM | POA: Diagnosis not present

## 2020-12-05 ENCOUNTER — Encounter: Payer: Self-pay | Admitting: Physical Therapy

## 2020-12-05 ENCOUNTER — Other Ambulatory Visit: Payer: Self-pay

## 2020-12-05 ENCOUNTER — Encounter: Payer: Medicaid Other | Admitting: Physical Therapy

## 2020-12-05 DIAGNOSIS — R252 Cramp and spasm: Secondary | ICD-10-CM

## 2020-12-05 DIAGNOSIS — R102 Pelvic and perineal pain: Secondary | ICD-10-CM

## 2020-12-05 DIAGNOSIS — R278 Other lack of coordination: Secondary | ICD-10-CM | POA: Diagnosis not present

## 2020-12-05 NOTE — Therapy (Signed)
[facility] [address], [address] [place], [place], [address] Phone: [phone]   Fax:  [phone]  Physical Therapy Treatment  Patient Details  Name: Rose Schwartz MRN: [id number] Date of Birth: 08/11/2000 Referring Provider (PT): Dr. Valentina Shaggy   Encounter Date: 12/05/2020   PT End of Session - 12/05/20 1504    Visit Number 2    Date for PT Re-Evaluation 03/31/21    Authorization Type [employer]    Authorization - Visit Number 1    Authorization - Number of Visits 12    PT Start Time 1500    PT Stop Time 1545    PT Time Calculation (min) 45 min    Activity Tolerance Patient tolerated treatment well    Behavior During Therapy [facility] for tasks assessed/performed           Past Medical History:  Diagnosis Date  . Anemia   . Asthma   . Bipolar 1 disorder (HCC)   . UTI (urinary tract infection)     Past Surgical History:  Procedure Laterality Date  . CYST EXCISION Left 03/21/2020   Procedure: Left wrist ganglion cyst excision;  Surgeon: [name], MD;  Location: [facility];  Service: Plastics;  Laterality: Left;  total case time is 1 hour  . FETAL AMNIOTIC BAND RELEASE    . TRIGGER FINGER RELEASE Left 03/21/2020   Procedure: first dorsal extensor compartment release;  Surgeon: [name], MD;  Location:  SURGERY CENTER;  Service: Plastics;  Laterality: Left;    There were no vitals filed for this visit.   Subjective Assessment - 12/05/20 1506    Subjective Today I had alot of pain and moved around and it felt better.    Patient Stated Goals reduction of pain with intercourse    Currently in Pain? Yes    Pain Score 8     Pain Location Pelvis    Pain Orientation Mid;Lower    Pain Descriptors / Indicators Sharp;Throbbing;Pressure    Pain Type Acute pain    Pain Onset More than a month ago    Pain Frequency Intermittent    Aggravating Factors  penile penetration  vaginally, holding urine too long, walking, reaching down from a standing position    Pain Relieving Factors breath    Multiple Pain Sites No              [facility] PT Assessment - 12/05/20 0001      Palpation   SI assessment  right ilium is anteriorly rotated                         [facility] Adult PT Treatment/Exercise - 12/05/20 0001      Lumbar Exercises: Stretches   Active Hamstring Stretch Right;Left;1 rep;30 seconds    Active Hamstring Stretch Limitations sitting    Quadruped Mid Back Stretch 1 rep;30 seconds    Quadruped Mid Back Stretch Limitations childs    Piriformis Stretch Right;Left;1 rep;30 seconds    Piriformis Stretch Limitations sitting    Other Lumbar Stretch Exercise hip adductor stretch in sitting 30 sec each, left and right    Other Lumbar Stretch Exercise happy baby holding for 1 min      Lumbar Exercises: Supine   Ab Set 5 reps;5 seconds    AB Set Limitations with keeping the thoracic lumbar junction flat,  engaging the lower abdominal and upper  Bridge 10 reps    Bridge Limitations keeping the pelvis flat      Lumbar Exercises: Quadruped   Madcat/Old Horse 10 reps      Manual Therapy   Manual Therapy Joint mobilization;Soft tissue mobilization    Joint Mobilization PA and rotational mobilization to T10-L1, and L3-S1; posterior left rib cage to improve bucket handle motion; PA springing to right SI joint; Anterior glide of right femoral head to improve hip extension    Soft tissue mobilization manual therapy to bil. quadratus, right psoas, righ tlevator ani while moving her right hip into external rotaiton                  PT Education - 12/05/20 1547    Education Details Access Code: [email or web address]    Person(s) Educated Patient    Methods Explanation;Demonstration;Verbal cues;Handout    Comprehension Returned demonstration;Verbalized understanding            PT Short Term Goals - 11/28/20 1716      PT SHORT TERM GOAL #1    Title independent with initial HEP    Time 4    Period Weeks    Status New    Target Date 12/26/20             PT Long Term Goals - 11/28/20 1717      PT LONG TERM GOAL #1   Title independent with advanced HEP    Baseline not educated yet    Time 4    Period Months    Status New    Target Date 03/31/21      PT LONG TERM GOAL #2   Title able to have penile penetration vaginally with pain level <.= 1-2/10 due to elongation of the pelvic floor muscles    Baseline pain level 9/10    Time 4    Period Months    Status New    Target Date 03/31/21      PT LONG TERM GOAL #3   Title report urinary leakage is 80% improved due to increaed pelvic floor strength >/= 3/5    Baseline pelvic floor strength is 1/5    Time 4    Period Months    Status New    Target Date 03/31/21      PT LONG TERM GOAL #4   Title able to perfrom daily activities with pain level </= 2-3/10 due to reduction of tone in the pelvic floor    Baseline pain level is 9/10    Time 4    Period Months    Status New    Target Date 03/31/21                 Plan - 12/05/20 1505    Clinical Impression Statement Patient pelvis was in correct alignment after manual work. She has difficulty with engaging the right gluteal with bridges. She needed tactile cues to flatten her thoracolumbar junction when she was engaging her abdominals in supine. She had improved right hip extension with bridge after manual work. Patient will benefit from skilled therapy to reduce muscle tesnion to decrease her pain and reduce the urinary leakage.    Personal Factors and Comorbidities Sex;Comorbidity 1    Comorbidities Bipolar    Examination-Activity Limitations Lift;Continence;Toileting    Examination-Participation Restrictions Interpersonal Relationship    Stability/Clinical Decision Making Stable/Uncomplicated    Rehab Potential Excellent    PT Frequency 1x / week    PT Duration Other (comment)  4 months   PT  Treatment/Interventions ADLs/Self Care Home Management;Biofeedback;Electrical Stimulation;Moist Heat;Cryotherapy;Therapeutic activities;Therapeutic exercise;Neuromuscular re-education;Patient/family education;Manual techniques;Dry needling;Spinal Manipulations    PT Next Visit Plan manual work to lower abdomen, outside pelvic floor and internally, diaphragmatic breathing    PT Home Exercise Plan Access Code: [email or web address]    Recommended Other Services MD signed initial eval    Consulted and Agree with Plan of Care Patient           Patient will benefit from skilled therapeutic intervention in order to improve the following deficits and impairments:  Decreased activity tolerance,Decreased strength,Increased fascial restricitons,Increased muscle spasms,Decreased coordination  Visit Diagnosis: Cramp and spasm  Other lack of coordination  Pelvic pain     Problem List Patient Active Problem List   Diagnosis Date Noted  . Frequent urination 10/11/2020  . De Quervain's tenosynovitis, left 10/11/2020  . Eczema 10/11/2020  . Jaw pain 10/11/2020  . Encounter to establish care with new doctor 06/23/2020  . Ganglion cyst of dorsum of left wrist 01/04/2020  . Alpha thalassemia silent carrier 08/04/2019  . 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 Asthma   . Bipolar 1 disorder [facility])     Eulis Foster, PT 12/05/20 3:58 PM   [place] [facility] at [facility] [address] [place], [place], [address] Phone: [phone]   Fax:  [phone]  Name: Rose Schwartz MRN: [id number] Date of Birth: [birth year]

## 2020-12-05 NOTE — Patient Instructions (Signed)
Access Code: [email or web address] URL: [email or web address] Date: 12/05/2020 Prepared by: Eulis Foster  Exercises Seated Hamstring Stretch - 1 x daily - 3 x weekly - 1 sets - 2 reps - 30 sec hold Seated Piriformis Stretch with Trunk Bend - 1 x daily - 3 x weekly - 1 sets - 2 reps - 30 sec hold Seated Hip Adductor Stretch - 1 x daily - 3 x weekly - 1 sets - 2 reps - 30 sec hold Cat-Camel - 1 x daily - 3 x weekly - 1 sets - 10 reps - 1 sec hold Child's Pose Stretch - 1 x daily - 3 x weekly - 1 sets - 1 reps - 30 sec hold Supine Pelvic Floor Stretch - 1 x daily - 3 x weekly - 1 sets - 1 reps - 1 min hold Supine Bridge - 1 x daily - 7 x weekly - 1 sets - 10 reps Hooklying Transversus Abdominis Palpation - 1 x daily - 7 x weekly - 1 sets - 5 reps - 5 sec hold Eulis Foster, PT [facility] [address] [place], [place] [address] W: [phone] [email or web address]

## 2020-12-07 DIAGNOSIS — F4310 Post-traumatic stress disorder, unspecified: Secondary | ICD-10-CM | POA: Diagnosis not present

## 2020-12-07 DIAGNOSIS — F330 Major depressive disorder, recurrent, mild: Secondary | ICD-10-CM | POA: Diagnosis not present

## 2020-12-07 DIAGNOSIS — F411 Generalized anxiety disorder: Secondary | ICD-10-CM | POA: Diagnosis not present

## 2020-12-12 ENCOUNTER — Telehealth: Payer: Self-pay | Admitting: Physical Therapy

## 2020-12-12 ENCOUNTER — Other Ambulatory Visit: Payer: Self-pay

## 2020-12-12 ENCOUNTER — Encounter: Payer: Medicaid Other | Attending: Obstetrics & Gynecology | Admitting: Physical Therapy

## 2020-12-12 ENCOUNTER — Encounter: Payer: Self-pay | Admitting: Physical Therapy

## 2020-12-12 DIAGNOSIS — R102 Pelvic and perineal pain: Secondary | ICD-10-CM | POA: Diagnosis not present

## 2020-12-12 DIAGNOSIS — R252 Cramp and spasm: Secondary | ICD-10-CM | POA: Diagnosis not present

## 2020-12-12 DIAGNOSIS — R278 Other lack of coordination: Secondary | ICD-10-CM | POA: Diagnosis not present

## 2020-12-12 NOTE — Telephone Encounter (Signed)
Patient was on her way.  Eulis Foster, PT @6 /01/2021@ 1:20 PM

## 2020-12-12 NOTE — Therapy (Signed)
[facility] at [facility] [address], [address] [place], [place], [address] Phone: [phone]   Fax:  [phone]  Physical Therapy Treatment  Patient Details  Name: Rose Schwartz MRN: [id number] Date of Birth: 2001-05-13 Referring Provider (PT): Dr. Valentina Shaggy   Encounter Date: 12/12/2020   PT End of Session - 12/12/20 1205    Visit Number 3    Date for PT Re-Evaluation 03/31/21    Authorization Type [employer]    Authorization - Visit Number 2    Authorization - Number of Visits 12    PT Start Time 1202   came late   PT Stop Time 1225    PT Time Calculation (min) 23 min    Activity Tolerance Patient tolerated treatment well    Behavior During Therapy [facility] for tasks assessed/performed           Past Medical History:  Diagnosis Date  . Anemia   . Asthma   . Bipolar 1 disorder (HCC)   . UTI (urinary tract infection)     Past Surgical History:  Procedure Laterality Date  . CYST EXCISION Left 03/21/2020   Procedure: Left wrist ganglion cyst excision;  Surgeon: [name], MD;  Location: [facility];  Service: Plastics;  Laterality: Left;  total case time is 1 hour  . FETAL AMNIOTIC BAND RELEASE    . TRIGGER FINGER RELEASE Left 03/21/2020   Procedure: first dorsal extensor compartment release;  Surgeon: [name], MD;  Location: [facility];  Service: Plastics;  Laterality: Left;    There were no vitals filed for this visit.   Subjective Assessment - 12/12/20 1205    Subjective Patinet came 32 minutes late due to over sleeping. Patient is not geting the sharp pains.    Patient Stated Goals reduction of pain with intercourse    Currently in Pain? No/denies    Multiple Pain Sites No              [facility] PT Assessment - 12/12/20 0001      Palpation   SI assessment  ASIS is equal                         [facility] Adult PT Treatment/Exercise - 12/12/20 0001       Manual Therapy   Manual Therapy Myofascial release    Myofascial Release fascial release in the umbilicus wiht therqapist having patient rotate her knees to elongate the tissue; release of the iliocecal valve, release around the bladder area                    PT Short Term Goals - 12/12/20 1232      PT SHORT TERM GOAL #1   Title independent with initial HEP    Time 4    Period Weeks    Status Achieved             PT Long Term Goals - 11/28/20 1717      PT LONG TERM GOAL #1   Title independent with advanced HEP    Baseline not educated yet    Time 4    Period Months    Status New    Target Date 03/31/21      PT LONG TERM GOAL #2   Title able to have penile penetration vaginally with pain level <.= 1-2/10 due to elongation of the pelvic floor muscles  Baseline pain level 9/10    Time 4    Period Months    Status New    Target Date 03/31/21      PT LONG TERM GOAL #3   Title report urinary leakage is 80% improved due to increaed pelvic floor strength >/= 3/5    Baseline pelvic floor strength is 1/5    Time 4    Period Months    Status New    Target Date 03/31/21      PT LONG TERM GOAL #4   Title able to perfrom daily activities with pain level </= 2-3/10 due to reduction of tone in the pelvic floor    Baseline pain level is 9/10    Time 4    Period Months    Status New    Target Date 03/31/21                 Plan - 12/12/20 1206    Clinical Impression Statement Patient reports she has not had the sharp pain in her abdomen since last visit. Patient reports the pain with her bladder filling is 85% better. She will have pain after she orgasms vaginally. Pelvis in correct alignment. She had some restrictions in the lower abdomen and they were released today. Patient will benefit from skilled therapy to reduce muscle tension to decrease her pain and reduce the urinary leakage.    Personal Factors and Comorbidities Sex;Comorbidity 1    Comorbidities  Bipolar    Examination-Activity Limitations Lift;Continence;Toileting    Examination-Participation Restrictions Interpersonal Relationship    Stability/Clinical Decision Making Stable/Uncomplicated    Rehab Potential Excellent    PT Frequency 1x / week    PT Duration Other (comment)   4 months   PT Treatment/Interventions ADLs/Self Care Home Management;Biofeedback;Electrical Stimulation;Moist Heat;Cryotherapy;Therapeutic activities;Therapeutic exercise;Neuromuscular re-education;Patient/family education;Manual techniques;Dry needling;Spinal Manipulations    PT Next Visit Plan outside pelvic floor and internally, diaphragmatic breathing, ask about leakage    PT Home Exercise Plan Access Code: [email or web address]    Consulted and Agree with Plan of Care Patient           Patient will benefit from skilled therapeutic intervention in order to improve the following deficits and impairments:  Decreased activity tolerance,Decreased strength,Increased fascial restricitons,Increased muscle spasms,Decreased coordination  Visit Diagnosis: Cramp and spasm  Other lack of coordination  Pelvic pain     Problem List Patient Active Problem List   Diagnosis Date Noted  . Frequent urination 10/11/2020  . De Quervain's tenosynovitis, left 10/11/2020  . Eczema 10/11/2020  . Jaw pain 10/11/2020  . Encounter to establish care with new doctor 06/23/2020  . Ganglion cyst of dorsum of left wrist 01/04/2020  . Alpha thalassemia silent carrier 08/04/2019  . 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 Asthma   . Bipolar 1 disorder [facility])     Eulis Foster, PT 12/12/20 12:33 PM   [place] [facility] at [facility] [address] [place], [place], [address] Phone: [phone]   Fax:  [phone]  Name: MARSHAWN NINNEMAN MRN: [id number] Date of Birth: [birth year]

## 2020-12-14 ENCOUNTER — Ambulatory Visit (INDEPENDENT_AMBULATORY_CARE_PROVIDER_SITE_OTHER): Payer: Medicaid Other | Admitting: Family Medicine

## 2020-12-14 ENCOUNTER — Other Ambulatory Visit: Payer: Self-pay

## 2020-12-14 VITALS — BP 112/84 | HR 104

## 2020-12-14 DIAGNOSIS — R0981 Nasal congestion: Secondary | ICD-10-CM | POA: Insufficient documentation

## 2020-12-14 DIAGNOSIS — Z20822 Contact with and (suspected) exposure to covid-19: Secondary | ICD-10-CM | POA: Diagnosis not present

## 2020-12-14 NOTE — Patient Instructions (Addendum)
It was great to see you! Thank you for allowing me to participate in your care!  Our plans for today:  -We are checking COVID-19 testing.  We should get the result back in 1 to 2 days and I will let you know the result when it returns. -If you develop any chest pains, trouble breathing, shortness of breath, vomiting to the extent that she cannot keep down fluids, or any other concerning symptoms I would like you to immediately let us know or go to the emergency department. -I recommend that you follow-up with your primary care doctor, Dr. Clent Ridges to discuss your headaches further.   Take care and seek immediate care sooner if you develop any concerns.   Dr. Jackelyn Poling, DO [facility]

## 2020-12-14 NOTE — Progress Notes (Signed)
    SUBJECTIVE:   CHIEF COMPLAINT / HPI:   Congestion and headaches: Patient is a 20 year old female that presents with congestion and headaches for 4 days.  She states that Monday she had congestion and a headache. Her congestion has resolved and she has the headaches on and off. Her son has had congestion as well. No fevers, no vomiting. She did have some diarrhea Saturday and Sunday. Cough for a few hours earlier in the week which has resolved. Some sore throat.   PERTINENT  PMH / PSH: None relevant  OBJECTIVE:   BP 112/84   Pulse (!) 104   SpO2 99%  .  Pulse recheck 88  General: NAD, pleasant, able to participate in exam, well-appearing HEENT: No pharyngeal erythema Cardiac: RRR, no murmurs. Respiratory: CTAB, normal effort, No wheezes, rales or rhonchi Skin: warm and dry, no rashes noted  ASSESSMENT/PLAN:   Cough/congestion: Assessment: 20 y.o. female with symptoms consistent with a viral upper respiratory tract infection.  Patient does not have any concerning symptoms.  Patient well-appearing at this time with no shortness of breath.  Her congestion has mostly improved.  The fact that her son was recently ill is likely the source of her earlier congestion and cough.  Overall differential can include seasonal allergies with postnasal drip leading to the cough and runny nose versus viral URI which can include common cold, influenza, COVID-19, or number of other respiratory viruses.  Unlikely bacterial at this time though the patient could potentially develop sinus infection due to congestion. Plan: -We will send for COVID-19 testing -Discussed return precautions -Discussed symptomatic treatment   Jackelyn Poling, DO [facility]

## 2020-12-15 LAB — SARS-COV-2, NAA 2 DAY TAT

## 2020-12-15 LAB — NOVEL CORONAVIRUS, NAA: SARS-CoV-2, NAA: NOT DETECTED

## 2020-12-18 ENCOUNTER — Encounter: Payer: Self-pay | Admitting: Family Medicine

## 2020-12-18 ENCOUNTER — Ambulatory Visit: Payer: Medicaid Other | Admitting: Family Medicine

## 2020-12-18 ENCOUNTER — Other Ambulatory Visit: Payer: Self-pay

## 2020-12-18 VITALS — BP 110/64 | HR 80 | Ht 61.0 in | Wt 132.0 lb

## 2020-12-18 DIAGNOSIS — R079 Chest pain, unspecified: Secondary | ICD-10-CM

## 2020-12-18 DIAGNOSIS — G8929 Other chronic pain: Secondary | ICD-10-CM | POA: Insufficient documentation

## 2020-12-18 DIAGNOSIS — R519 Headache, unspecified: Secondary | ICD-10-CM

## 2020-12-18 NOTE — Progress Notes (Signed)
    SUBJECTIVE:   CHIEF COMPLAINT / HPI: Headaches   Headaches: Chronic and unchanged. Reports they feel like migraines, frontal and goes to the top of her head. Needs quite and lights off when it comes on. Will last several hours to max of 2 days. Usually better with a nap. Takes ibuprofen/tylenol and helps make it stop. Feels lightheaded and blurred vision with it. Sometimes will have numbness on the location of her headache but no where else. Denies any weakness.   First started had headaches around age 71. Usually has a headache about 3 times monthly, feels like she has been "doing good" in terms of headaches. Doesn't drink much water during the day. Sleeps well at night. Does snore at night but no apnea that her boyfriend has noticed. Reports poor diet, likes sugary foods.  No symptoms prior to onset of headache that she is aware of.  She decided to be evaluated for headaches today after recent speaking with a friend at a baby shower a few weeks ago. Reported she also had migraines from an early age and was found to have several cerebral aneurysms.  Patient has no known family history of cerebral aneurysms or early CVA.  She requests a referral to neurology.    Chest pain: Reports intermittent for the last couple of months. First time was awhile ago and can't remember much about it. Once time occurred after eating garlic similar (but worse) to previous heartburn/GERD. Second time about one month ago came out of nowhere. Describes as a "sharp electric contraction", lasted about an hour. She went to sleep and it went away.  No chest pain/problems in between these 3 episodes.  She does wonder if her anxiety has played a role.  Denies any dyspnea on exertion or shortness of breath.  No known family history of early CAD or sudden death.   PERTINENT  PMH / PSH: Alpha thalassemia silent carrier, bipolar 1 disorder/anxiety  OBJECTIVE:   BP 110/64   Pulse 80   Ht 5\' 1"  (1.549 m)   Wt 132 lb (59.9  kg)   Breastfeeding Yes   BMI 24.94 kg/m    General: Alert, NAD HEENT: NCAT, MMM Cardiac: RRR no m/g/r Lungs: Clear bilaterally, no increased WOB on RA Ext: Warm, dry, 2+ distal pulses Neuro: Alert and oriented.  CN II-XII intact.  EOMI, PERRLA.  5/5 upper and lower extremity strength bilaterally.  Intact 2/4 patellar reflexes bilaterally.  Sensation to light touch intact throughout.  Normal gait.  Rapid hand movement intact.  ASSESSMENT/PLAN:   Chronic headaches Sounds most consistent with migraines without aura.  No alarm s/sx. Provided reassurance that her headaches are unlikely related to underlying cerebral aneurysms, however given patient concern and request will place referral to neurology.  Encouraged balanced diet, adequate hydration, and consistent sleeping schedule.  Overall minimally bothered by current frequency of headaches, not interested in prophylactic management at this time.  Continue ibuprofen PRN.   Chest pain 3 brief episodes of the past several months, suspect multifactorial with anxiety and GERD.  Does not sound consistent for PE or ACS with low risk probability.  Provided reassurance, recommended trial of Pepcid/Tums and evaluation if recurs.     Follow-up or sooner if worsening.  , DO [facility]

## 2020-12-18 NOTE — Patient Instructions (Addendum)
Wonderful to see you! I will place a referral to neurology. Encourage you to work towards a balanced diet with reducing sugar and drinking plenty of water during the day.

## 2020-12-18 NOTE — Assessment & Plan Note (Addendum)
Sounds most consistent with migraines without aura.  No alarm s/sx. Provided reassurance that her headaches are unlikely related to underlying cerebral aneurysms, however given patient concern and request will place referral to neurology.  Encouraged balanced diet, adequate hydration, and consistent sleeping schedule.  Overall minimally bothered by current frequency of headaches, not interested in prophylactic management at this time.  Continue ibuprofen PRN.

## 2020-12-18 NOTE — Assessment & Plan Note (Signed)
3 brief episodes of the past several months, suspect multifactorial with anxiety and GERD.  Does not sound consistent for PE or ACS with low risk probability.  Provided reassurance, recommended trial of Pepcid/Tums and evaluation if recurs.

## 2020-12-20 ENCOUNTER — Encounter: Payer: Self-pay | Admitting: Neurology

## 2020-12-21 DIAGNOSIS — F411 Generalized anxiety disorder: Secondary | ICD-10-CM | POA: Diagnosis not present

## 2020-12-21 DIAGNOSIS — F330 Major depressive disorder, recurrent, mild: Secondary | ICD-10-CM | POA: Diagnosis not present

## 2020-12-21 DIAGNOSIS — F4310 Post-traumatic stress disorder, unspecified: Secondary | ICD-10-CM | POA: Diagnosis not present

## 2020-12-26 ENCOUNTER — Other Ambulatory Visit: Payer: Self-pay

## 2020-12-26 ENCOUNTER — Encounter: Payer: Medicaid Other | Admitting: Physical Therapy

## 2020-12-26 ENCOUNTER — Encounter: Payer: Self-pay | Admitting: Physical Therapy

## 2020-12-26 DIAGNOSIS — R252 Cramp and spasm: Secondary | ICD-10-CM

## 2020-12-26 DIAGNOSIS — R102 Pelvic and perineal pain: Secondary | ICD-10-CM | POA: Diagnosis not present

## 2020-12-26 DIAGNOSIS — R278 Other lack of coordination: Secondary | ICD-10-CM

## 2020-12-26 NOTE — Therapy (Signed)
[facility] [address], [address] [place], [place], [address] Phone: [phone]   Fax:  [phone]  Physical Therapy Treatment  Patient Details  Name: Rose Schwartz MRN: [id number] Date of Birth: 11/17/2000 Referring Provider (PT): Dr. Mary Miller   Encounter Date: 12/26/2020   PT End of Session - 12/26/20 1257     Visit Number 4    Date for PT Re-Evaluation 03/31/21    Authorization Type [employer]    Authorization - Visit Number 4    Authorization - Number of Visits 12    PT Start Time 1140    PT Stop Time 1225    PT Time Calculation (min) 45 min    Activity Tolerance Patient tolerated treatment well    Behavior During Therapy WFL for tasks assessed/performed             Past Medical History:  Diagnosis Date   Anemia    Asthma    Bipolar 1 disorder (HCC)    Frequent urination 10/11/2020   UTI (urinary tract infection)     Past Surgical History:  Procedure Laterality Date   CYST EXCISION Left 03/21/2020   Procedure: Left wrist ganglion cyst excision;  Surgeon: Pace, Collier S, MD;  Location: [facility];  Service: Plastics;  Laterality: Left;  total case time is 1 hour   FETAL AMNIOTIC BAND RELEASE     TRIGGER FINGER RELEASE Left 03/21/2020   Procedure: first dorsal extensor compartment release;  Surgeon: Pace, Collier S, MD;  Location: [facility];  Service: Plastics;  Laterality: Left;    There were no vitals filed for this visit.   Subjective Assessment - 12/26/20 1143     Subjective Patient is having less pain with penile penetration vaginally. Laying on side and when he is on top hurst. I ran and it hurt the hips and pelvis.    Patient Stated Goals reduction of pain with intercourse    Currently in Pain? Yes    Pain Score 2     Pain Location Pelvis    Pain Orientation Mid;Lower    Pain Descriptors / Indicators Pressure;Sharp    Pain Type Acute pain    Pain  Onset More than a month ago    Pain Frequency Intermittent    Aggravating Factors  penile penetration vaginally, holding urine too long, walking, reaching down from a standing position    Pain Relieving Factors breath    Multiple Pain Sites No                [facility] PT Assessment - 12/26/20 0001       Palpation   SI assessment  ASIS is equal    Palpation comment tender in the lower abdomen                           [facility] Adult PT Treatment/Exercise - 12/26/20 0001       Manual Therapy   Manual Therapy Soft tissue mobilization;Myofascial release    Soft tissue mobilization manual mobilization to lower abdominal region, around the bladder, suprapubically; along bilateral hip adductor and around the ishical tuberosity    Myofascial Release fascial release around the lower abdomen working in the diagonal plane, tissue rolling,                      PT Short Term Goals - 12/12/20 1232         PT SHORT TERM GOAL #1   Title independent with initial HEP    Time 4    Period Weeks    Status Achieved               PT Long Term Goals - 12/26/20 1300       PT LONG TERM GOAL #3   Title report urinary leakage is 80% improved due to increaed pelvic floor strength >/= 3/5    Baseline no leakage since last visit    Time 4    Period Months    Status Partially Met                   Plan - 12/26/20 1257     Clinical Impression Statement Patient reports no urinary leakage since last visit. She had fascial tightness in the lower abdomen and inner thigh. Patient continues to have pain when her bladder is full. Patient pain during intercourse is 2/10  compared to 4/10 and able to participate for a longer period of time. Patient will benefit from skilled therapy to reduce muscles tension to decrease her pain and reduce the urinary leakage.    Personal Factors and Comorbidities Sex;Comorbidity 1    Comorbidities Bipolar    Examination-Activity  Limitations Lift;Continence;Toileting    Examination-Participation Restrictions Interpersonal Relationship    Stability/Clinical Decision Making Stable/Uncomplicated    Rehab Potential Excellent    PT Frequency 1x / week    PT Duration Other (comment)   4 months   PT Treatment/Interventions ADLs/Self Care Home Management;Biofeedback;Electrical Stimulation;Moist Heat;Cryotherapy;Therapeutic activities;Therapeutic exercise;Neuromuscular re-education;Patient/family education;Manual techniques;Dry needling;Spinal Manipulations    PT Next Visit Plan outside pelvic floor and internally, core strength    PT Home Exercise Plan Access Code: [email or web address]    Consulted and Agree with Plan of Care Patient             Patient will benefit from skilled therapeutic intervention in order to improve the following deficits and impairments:  Decreased activity tolerance, Decreased strength, Increased fascial restricitons, Increased muscle spasms, Decreased coordination  Visit Diagnosis: Cramp and spasm  Pelvic pain  Other lack of coordination     Problem List Patient Active Problem List   Diagnosis Date Noted   Chronic headaches 12/18/2020   Chest pain 12/18/2020   Nose congestion 12/14/2020   De Quervain's tenosynovitis, left 10/11/2020   Eczema 10/11/2020   Jaw pain 10/11/2020   Encounter to establish care with new doctor 06/23/2020   Ganglion cyst of dorsum of left wrist 01/04/2020   Alpha thalassemia silent carrier 08/04/2019   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    

## 2021-01-09 ENCOUNTER — Encounter: Payer: Medicaid Other | Admitting: Physical Therapy

## 2021-01-10 ENCOUNTER — Ambulatory Visit: Payer: Medicaid Other | Admitting: Family Medicine

## 2021-01-17 DIAGNOSIS — F411 Generalized anxiety disorder: Secondary | ICD-10-CM | POA: Diagnosis not present

## 2021-01-17 DIAGNOSIS — F330 Major depressive disorder, recurrent, mild: Secondary | ICD-10-CM | POA: Diagnosis not present

## 2021-01-17 DIAGNOSIS — F4310 Post-traumatic stress disorder, unspecified: Secondary | ICD-10-CM | POA: Diagnosis not present

## 2021-01-24 DIAGNOSIS — F4310 Post-traumatic stress disorder, unspecified: Secondary | ICD-10-CM | POA: Diagnosis not present

## 2021-01-24 DIAGNOSIS — F330 Major depressive disorder, recurrent, mild: Secondary | ICD-10-CM | POA: Diagnosis not present

## 2021-01-24 DIAGNOSIS — F411 Generalized anxiety disorder: Secondary | ICD-10-CM | POA: Diagnosis not present

## 2021-01-26 ENCOUNTER — Ambulatory Visit: Payer: Medicaid Other | Attending: Obstetrics & Gynecology | Admitting: Physical Therapy

## 2021-01-26 ENCOUNTER — Other Ambulatory Visit: Payer: Self-pay

## 2021-01-26 ENCOUNTER — Encounter: Payer: Self-pay | Admitting: Physical Therapy

## 2021-01-26 DIAGNOSIS — R252 Cramp and spasm: Secondary | ICD-10-CM | POA: Diagnosis not present

## 2021-01-26 DIAGNOSIS — R278 Other lack of coordination: Secondary | ICD-10-CM | POA: Diagnosis not present

## 2021-01-26 DIAGNOSIS — R102 Pelvic and perineal pain unspecified side: Secondary | ICD-10-CM

## 2021-01-26 NOTE — Therapy (Signed)
[facility] [address] [place], [place], [address] Phone: [phone]   Fax:  [phone]  Physical Therapy Treatment  Patient Details  Name: Rose Schwartz MRN: [id number] Date of Birth: [birth year] Referring Provider (PT): Dr. Hale Bogus   Encounter Date: 01/26/2021   PT End of Session - 01/26/21 1111     Visit Number 5    Date for PT Re-Evaluation 03/31/21    Authorization Type [employer]    Authorization Time Period 5/26-9/24    Authorization - Visit Number 1    Authorization - Number of Visits 17    PT Start Time 1113   came late   PT Stop Time 1145    PT Time Calculation (min) 32 min    Activity Tolerance Patient tolerated treatment well    Behavior During Therapy [facility] for tasks assessed/performed             Past Medical History:  Diagnosis Date   Anemia    Asthma    Bipolar 1 disorder ([place])    Frequent urination 10/11/2020   UTI (urinary tract infection)     Past Surgical History:  Procedure Laterality Date   CYST EXCISION Left 03/21/2020   Procedure: Left wrist ganglion cyst excision;  Surgeon: Cindra Presume, MD;  Location: [facility];  Service: Plastics;  Laterality: Left;  total case time is 1 hour   FETAL AMNIOTIC BAND RELEASE     TRIGGER FINGER RELEASE Left 03/21/2020   Procedure: first dorsal extensor compartment release;  Surgeon: Cindra Presume, MD;  Location: [facility];  Service: Plastics;  Laterality: Left;    There were no vitals filed for this visit.   Subjective Assessment - 01/26/21 1114     Subjective I have had alot of pain. 7/1 and , 7/20 had a sharp pain in abdomen and pelvis that was a pulling and strain pain. Discomfort with penile penetration.    Patient Stated Goals reduction of pain with intercourse    Currently in Pain? Yes    Pain Score --   random sharp pain is 8, during intercourse 3   Pain Location Pelvis    Pain  Orientation Mid;Lower    Pain Descriptors / Indicators Sharp    Pain Type Acute pain    Pain Onset More than a month ago    Pain Frequency Intermittent    Aggravating Factors  penile penetration vaginally, holding urine too long, walking, reaching down from a standing position    Pain Relieving Factors breath    Multiple Pain Sites No                            Pelvic Floor Special Questions - 01/26/21 0001     Urinary Leakage No    Pelvic Floor Internal Exam Patient confirms identificaiton adn approves PT to assess pelvic floor and treatment    Exam Type Vaginal    Strength weak squeeze, no lift               [facility] Adult PT Treatment/Exercise - 01/26/21 0001       Neuro Re-ed    Neuro Re-ed Details  pelvic floor contraction with therapist index finger giving tactile cues to contract and fully relax      Manual Therapy   Manual Therapy Internal Pelvic Floor    Internal Pelvic Floor manual work to the levator  ani, obturator internist, along the post. vaginal canal while monitoring for pain                      PT Short Term Goals - 12/12/20 1232       PT SHORT TERM GOAL #1   Title independent with initial HEP    Time 4    Period Weeks    Status Achieved               PT Long Term Goals - 01/26/21 1123       PT LONG TERM GOAL #1   Title independent with advanced HEP    Time 4    Status On-going      PT LONG TERM GOAL #2   Title able to have penile penetration vaginally with pain level <.= 1-2/10 due to elongation of the pelvic floor muscles    Baseline pain level 3/10    Time 4    Period Months    Status On-going      PT LONG TERM GOAL #3   Title report urinary leakage is 80% improved due to increaed pelvic floor strength >/= 3/5    Baseline no leakage since last visit    Period Months    Status Partially Met      PT LONG TERM GOAL #4   Title able to perfrom daily activities with pain level </= 2-3/10 due to  reduction of tone in the pelvic floor    Baseline pain level is 9/10    Time 4    Period Months    Status On-going                   Plan - 01/26/21 1124     Clinical Impression Statement Patient has not leaked urine for 1 month. She will get random pelvic floor pains that become intense to level 8/10. Her pain is 3/10 with intercourse and is dull now instead of sharp. Patient pelvic floor strength has increased to 2/5. She has trigger points in the levator ani and obturator internist. Patient will benefit from skilled therapy to reduce muscle tenstion to decrease her pain with intercourse and when it comes on randomly.    Personal Factors and Comorbidities Sex;Comorbidity 1    Comorbidities Bipolar    Examination-Activity Limitations Lift;Continence;Toileting    Examination-Participation Restrictions Interpersonal Relationship    Stability/Clinical Decision Making Stable/Uncomplicated    Rehab Potential Excellent    PT Frequency 1x / week    PT Duration Other (comment)   4 months   PT Treatment/Interventions ADLs/Self Care Home Management;Biofeedback;Electrical Stimulation;Moist Heat;Cryotherapy;Therapeutic activities;Therapeutic exercise;Neuromuscular re-education;Patient/family education;Manual techniques;Dry needling;Spinal Manipulations    PT Next Visit Plan possible internal pelvic floor work; core strength with pelvic floor contraction    PT Home Exercise Plan Access Code: [email or web address]    Consulted and Agree with Plan of Care Patient             Patient will benefit from skilled therapeutic intervention in order to improve the following deficits and impairments:  Decreased activity tolerance, Decreased strength, Increased fascial restricitons, Increased muscle spasms, Decreased coordination  Visit Diagnosis: Cramp and spasm  Pelvic pain  Other lack of coordination     Problem List Patient Active Problem List   Diagnosis Date Noted   Chronic headaches  12/18/2020   Chest pain 12/18/2020   Nose congestion 12/14/2020   De Quervain's tenosynovitis, left 10/11/2020   Eczema 10/11/2020   Jaw pain  10/11/2020   Encounter to establish care with new doctor 06/23/2020   Ganglion cyst of dorsum of left wrist 01/04/2020   Alpha thalassemia silent carrier 08/04/2019   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 [place], [place], [address] Phone: [phone]   Fax:  [phone]  Name: AMORE ACKMAN MRN: [id number] Date of Birth: 2000-07-12

## 2021-02-08 ENCOUNTER — Encounter: Payer: Self-pay | Admitting: Physical Therapy

## 2021-02-08 ENCOUNTER — Ambulatory Visit: Payer: Medicaid Other | Attending: Obstetrics & Gynecology | Admitting: Physical Therapy

## 2021-02-08 ENCOUNTER — Other Ambulatory Visit: Payer: Self-pay

## 2021-02-08 DIAGNOSIS — R102 Pelvic and perineal pain: Secondary | ICD-10-CM | POA: Diagnosis not present

## 2021-02-08 DIAGNOSIS — R278 Other lack of coordination: Secondary | ICD-10-CM | POA: Insufficient documentation

## 2021-02-08 DIAGNOSIS — R252 Cramp and spasm: Secondary | ICD-10-CM | POA: Diagnosis not present

## 2021-02-08 NOTE — Therapy (Signed)
[facility] [address], [address] [place], [place], [address] Phone: [phone]   Fax:  [phone]  Physical Therapy Treatment  Patient Details  Name: Rose Schwartz MRN: [id number] Date of Birth: Oct 03, 2000 Referring Provider (PT): Dr. Valentina Shaggy   Encounter Date: 02/08/2021   PT End of Session - 02/08/21 1615     Visit Number 6    Date for PT Re-Evaluation 03/31/21    Authorization Type [employer]    Authorization Time Period 5/26-9/24    Authorization - Visit Number 2    Authorization - Number of Visits 17    PT Start Time 1545   came late   PT Stop Time 1610    PT Time Calculation (min) 25 min    Activity Tolerance Patient tolerated treatment well    Behavior During Therapy [facility] for tasks assessed/performed             Past Medical History:  Diagnosis Date   Anemia    Asthma    Bipolar 1 disorder (HCC)    Frequent urination 10/11/2020   UTI (urinary tract infection)     Past Surgical History:  Procedure Laterality Date   CYST EXCISION Left 03/21/2020   Procedure: Left wrist ganglion cyst excision;  Surgeon: [name], MD;  Location: [facility];  Service: Plastics;  Laterality: Left;  total case time is 1 hour   FETAL AMNIOTIC BAND RELEASE     TRIGGER FINGER RELEASE Left 03/21/2020   Procedure: first dorsal extensor compartment release;  Surgeon: [name], MD;  Location: [facility];  Service: Plastics;  Laterality: Left;    There were no vitals filed for this visit.   Subjective Assessment - 02/08/21 1547     Subjective I am doing alot better with the pelvic pain. I only had 2 days with pelvic pain. I may be bleeding today with a little bit more than spotting. No pain with intercourse.    Patient Stated Goals reduction of pain with intercourse    Currently in Pain? Yes    Pain Score 8     Pain Location Abdomen    Pain Orientation Lower    Pain  Descriptors / Indicators Sharp    Pain Type Acute pain    Pain Onset More than a month ago    Pain Frequency Intermittent    Aggravating Factors  penile penetration vaginally    Pain Relieving Factors breath    Multiple Pain Sites No                               [facility] Adult PT Treatment/Exercise - 02/08/21 0001       Manual Therapy   Manual Therapy Soft tissue mobilization    Soft tissue mobilization manual work to the lower lateral aspect of the abdomen to work on the tissue and pain                    PT Education - 02/08/21 1615     Education Details Access Code: [email or web address]    Person(s) Educated Patient    Methods Explanation;Demonstration;Verbal cues;Handout    Comprehension Returned demonstration;Verbalized understanding              PT Short Term Goals - 12/12/20 1232       PT SHORT TERM GOAL #1   Title independent with initial HEP  Time 4    Period Weeks    Status Achieved               PT Long Term Goals - 02/08/21 1625       PT LONG TERM GOAL #1   Title independent with advanced HEP    Baseline not educated yet    Time 4    Period Months    Status On-going      PT LONG TERM GOAL #2   Title able to have penile penetration vaginally with pain level <.= 1-2/10 due to elongation of the pelvic floor muscles    Time 4    Period Months    Status Achieved      PT LONG TERM GOAL #3   Title report urinary leakage is 80% improved due to increaed pelvic floor strength >/= 3/5    Time 4    Period Months    Status Achieved      PT LONG TERM GOAL #4   Title able to perfrom daily activities with pain level </= 2-3/10 due to reduction of tone in the pelvic floor    Time 4    Period Months    Status On-going                   Plan - 02/08/21 1616     Clinical Impression Statement Patient is able to have 85% less pain with holding her urine. No pain with reaching from a standing positions. Patient has some  tenderness located in the lower lateral abdominal area. Patient is not having pain with intercourse. She has low strength of her abdominals. Patient will benefit from from skilled therapy to improve core strength.    Personal Factors and Comorbidities Sex;Comorbidity 1    Comorbidities Bipolar    Examination-Activity Limitations Lift;Continence;Toileting    Examination-Participation Restrictions Interpersonal Relationship    Stability/Clinical Decision Making Stable/Uncomplicated    Rehab Potential Excellent    PT Frequency 1x / week    PT Duration Other (comment)   4 months   PT Treatment/Interventions ADLs/Self Care Home Management;Biofeedback;Electrical Stimulation;Moist Heat;Cryotherapy;Therapeutic activities;Therapeutic exercise;Neuromuscular re-education;Patient/family education;Manual techniques;Dry needling;Spinal Manipulations    PT Next Visit Plan core work, if doing well then discharge.    PT Home Exercise Plan Access Code: [email or web address]    Consulted and Agree with Plan of Care Patient             Patient will benefit from skilled therapeutic intervention in order to improve the following deficits and impairments:  Decreased activity tolerance, Decreased strength, Increased fascial restricitons, Increased muscle spasms, Decreased coordination  Visit Diagnosis: Cramp and spasm  Pelvic pain  Other lack of coordination     Problem List Patient Active Problem List   Diagnosis Date Noted   Chronic headaches 12/18/2020   Chest pain 12/18/2020   Nose congestion 12/14/2020   De Quervain's tenosynovitis, left 10/11/2020   Eczema 10/11/2020   Jaw pain 10/11/2020   Encounter to establish care with new doctor 06/23/2020   Ganglion cyst of dorsum of left wrist 01/04/2020   Alpha thalassemia silent carrier 08/04/2019   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, [address] [place], [place], [address] Phone: [phone]   Fax:  [phone]  Name: Rose Schwartz MRN: [id number] Date of Birth:  11/27/2000    

## 2021-02-08 NOTE — Patient Instructions (Signed)
Access Code: [email or web address] URL: [email or web address] Date: 02/08/2021 Prepared by: Eulis Foster  Exercises Seated Hamstring Stretch - 1 x daily - 3 x weekly - 1 sets - 2 reps - 30 sec hold Seated Piriformis Stretch with Trunk Bend - 1 x daily - 3 x weekly - 1 sets - 2 reps - 30 sec hold Seated Hip Adductor Stretch - 1 x daily - 3 x weekly - 1 sets - 2 reps - 30 sec hold Cat-Camel - 1 x daily - 3 x weekly - 1 sets - 10 reps - 1 sec hold Child's Pose Stretch - 1 x daily - 3 x weekly - 1 sets - 1 reps - 30 sec hold Supine Pelvic Floor Stretch - 1 x daily - 3 x weekly - 1 sets - 1 reps - 1 min hold Supine Bridge - 1 x daily - 7 x weekly - 1 sets - 10 reps Hooklying Transversus Abdominis Palpation - 1 x daily - 7 x weekly - 1 sets - 5 reps - 5 sec hold Dead Bug Alternating Arm Extension - 1 x daily - 7 x weekly - 2 sets - 10 reps Supine March - 1 x daily - 7 x weekly - 2 sets - 10 reps [facility] [address], [address] [place], [place] [address] Phone # [phone] Fax [phone]

## 2021-02-13 ENCOUNTER — Other Ambulatory Visit: Payer: Self-pay

## 2021-02-13 ENCOUNTER — Encounter: Payer: Medicaid Other | Attending: Obstetrics & Gynecology | Admitting: Physical Therapy

## 2021-02-13 ENCOUNTER — Encounter: Payer: Self-pay | Admitting: Physical Therapy

## 2021-02-13 DIAGNOSIS — R278 Other lack of coordination: Secondary | ICD-10-CM | POA: Diagnosis not present

## 2021-02-13 DIAGNOSIS — R102 Pelvic and perineal pain: Secondary | ICD-10-CM | POA: Diagnosis not present

## 2021-02-13 DIAGNOSIS — R252 Cramp and spasm: Secondary | ICD-10-CM | POA: Diagnosis not present

## 2021-02-13 NOTE — Therapy (Signed)
[place] at [facility] [address] [place], [place], [address] Phone: [phone]   Fax:  [phone]  Physical Therapy Treatment  Patient Details  Name: Rose Schwartz MRN: [id number] Date of Birth: [birth year] Referring Provider (PT): Dr. Hale Bogus   Encounter Date: 02/13/2021   PT End of Session - 02/13/21 0916     Visit Number 7    Date for PT Re-Evaluation 03/31/21    Authorization Type [employer]    Authorization Time Period 5/26-9/24    Authorization - Visit Number 3    Authorization - Number of Visits 17    PT Start Time 0930    PT Stop Time 1012    PT Time Calculation (min) 42 min    Activity Tolerance Patient tolerated treatment well    Behavior During Therapy [facility] for tasks assessed/performed             Past Medical History:  Diagnosis Date   Anemia    Asthma    Bipolar 1 disorder ([place])    Frequent urination 10/11/2020   UTI (urinary tract infection)     Past Surgical History:  Procedure Laterality Date   CYST EXCISION Left 03/21/2020   Procedure: Left wrist ganglion cyst excision;  Surgeon: Cindra Presume, MD;  Location: [place];  Service: Plastics;  Laterality: Left;  total case time is 1 hour   FETAL AMNIOTIC BAND RELEASE     TRIGGER FINGER RELEASE Left 03/21/2020   Procedure: first dorsal extensor compartment release;  Surgeon: Cindra Presume, MD;  Location: [place];  Service: Plastics;  Laterality: Left;    There were no vitals filed for this visit.   Subjective Assessment - 02/13/21 0840     Subjective I started to workout. I have been doing yoga and meditating. No pain today.    Patient Stated Goals reduction of pain with intercourse    Currently in Pain? No/denies                [facility] PT Assessment - 02/13/21 0001       Assessment   Medical Diagnosis M62.89 Pelvic floor dysfunction    Referring Provider (PT) Dr. Hale Bogus     Onset Date/Surgical Date 07/08/20    Prior Therapy none      Precautions   Precautions None      Restrictions   Weight Bearing Restrictions No      Home Environment   Living Environment Private residence      Prior Function   Level of Independence Independent    Vocation Full time employment    Vocation Requirements lifting, standing      Cognition   Overall Cognitive Status Within Functional Limits for tasks assessed      Observation/Other Assessments   Focus on Therapeutic Outcomes (FOTO)  UIQ-7 0; CRAIQ-7 0; POPIQ-7 5; PFIQ-7 5      AROM   Overall AROM Comments FULL LUMBAR rom      Palpation   SI assessment  ASIS is equal                        Pelvic Floor Special Questions - 02/13/21 0001     Number of Vaginal Deliveries 1    Diastasis Recti none    Currently Sexually Active Yes    Is this Painful No    Urinary Leakage No    Fecal incontinence No  Strength weak squeeze, no lift               [facility] Adult PT Treatment/Exercise - 02/13/21 0001       Lumbar Exercises: Stretches   Hip Flexor Stretch Right;1 rep;30 seconds   with right leg off the mat and therapist stretching   Hip Flexor Stretch Limitations prone with towel to stretch the right hip flexor and trunk extension      Lumbar Exercises: Supine   Bent Knee Raise 10 reps    Bent Knee Raise Limitations each leg with core engagement    Bridge 10 reps    Bridge Limitations keeping the pelvis flat    Other Supine Lumbar Exercises supine alternate shoulder flexion with core engaged 10x each arm      Manual Therapy   Manual Therapy Myofascial release;Soft tissue mobilization    Soft tissue mobilization circular massage around the adomen to improve mobility of the tissue and assess the tissue    Myofascial Release fascial releas around the right lower quadrant, relase suprapubically to lift the intestines from the pubic bone                    PT Education - 02/13/21 0915      Education Details Access Code: [email or web address]    Person(s) Educated Patient    Methods Explanation;Demonstration;Verbal cues;Handout    Comprehension Returned demonstration;Verbalized understanding              PT Short Term Goals - 02/13/21 0918       PT SHORT TERM GOAL #1   Title independent with initial HEP    Time 4    Period Weeks    Status Achieved               PT Long Term Goals - 02/13/21 3267       PT LONG TERM GOAL #1   Title independent with advanced HEP    Time 4    Period Months    Status Achieved      PT LONG TERM GOAL #2   Title able to have penile penetration vaginally with pain level <.= 1-2/10 due to elongation of the pelvic floor muscles    Baseline pain level 3/10    Time 4    Period Months    Status Achieved      PT LONG TERM GOAL #3   Title report urinary leakage is 80% improved due to increaed pelvic floor strength >/= 3/5    Baseline no leakage since last visit    Time 4    Period Months    Status Achieved      PT LONG TERM GOAL #4   Title able to perfrom daily activities with pain level </= 2-3/10 due to reduction of tone in the pelvic floor    Baseline no pain    Time 4    Period Months    Status Achieved                   Plan - 02/13/21 1245     Clinical Impression Statement Patient is not leaking urine. She is not have pain with penile penetration vaginally. She is doing her exercises and has a stronger core. She is not encorporating yoga into her routine. She will have pelvic pain during her cycle. Pelvic floor strength is 2/5. She has full lumbar ROM and pelvis in correct alignment. She still has some tenderness located in the lower  abdominal area. Patient is ready for discharge.    Personal Factors and Comorbidities Sex;Comorbidity 1    Comorbidities Bipolar    Examination-Activity Limitations Lift;Continence;Toileting    Examination-Participation Restrictions Interpersonal Relationship    Stability/Clinical  Decision Making Stable/Uncomplicated    Rehab Potential Excellent    PT Frequency --    PT Duration --    PT Treatment/Interventions ADLs/Self Care Home Management;Biofeedback;Electrical Stimulation;Moist Heat;Cryotherapy;Therapeutic activities;Therapeutic exercise;Neuromuscular re-education;Patient/family education;Manual techniques;Dry needling;Spinal Manipulations    PT Next Visit Plan Discharge to HEP    PT Home Exercise Plan Access Code: [email or web address]    [email or web address] and Agree with Plan of Care Patient             Patient will benefit from skilled therapeutic intervention in order to improve the following deficits and impairments:  Decreased activity tolerance, Decreased strength, Increased fascial restricitons, Increased muscle spasms, Decreased coordination  Visit Diagnosis: Cramp and spasm  Pelvic pain  Other lack of coordination     Problem List Patient Active Problem List   Diagnosis Date Noted   Chronic headaches 12/18/2020   Chest pain 12/18/2020   Nose congestion 12/14/2020   De Quervain's tenosynovitis, left 10/11/2020   Eczema 10/11/2020   Jaw pain 10/11/2020   Encounter to establish care with new doctor 06/23/2020   Ganglion cyst of dorsum of left wrist 01/04/2020   Alpha thalassemia silent carrier 08/04/2019   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    Remaining deficits: See above.    Education / Equipment: HEP   Patient agrees to discharge. Patient goals were met. Patient is being  discharged due to meeting the stated rehab goals. Thank you for the referral. Earlie Counts, PT 02/13/21 9:20 AM

## 2021-02-13 NOTE — Patient Instructions (Signed)
Access Code: [email or web address] URL: [email or web address] Date: 02/13/2021 Prepared by: Eulis Foster  Exercises Seated Hamstring Stretch - 1 x daily - 3 x weekly - 1 sets - 2 reps - 30 sec hold Seated Piriformis Stretch with Trunk Bend - 1 x daily - 3 x weekly - 1 sets - 2 reps - 30 sec hold Seated Hip Adductor Stretch - 1 x daily - 3 x weekly - 1 sets - 2 reps - 30 sec hold Cat-Camel - 1 x daily - 3 x weekly - 1 sets - 10 reps - 1 sec hold Child's Pose Stretch - 1 x daily - 3 x weekly - 1 sets - 1 reps - 30 sec hold Supine Pelvic Floor Stretch - 1 x daily - 3 x weekly - 1 sets - 1 reps - 1 min hold Supine Bridge - 1 x daily - 7 x weekly - 1 sets - 10 reps Hooklying Transversus Abdominis Palpation - 1 x daily - 7 x weekly - 1 sets - 5 reps - 5 sec hold Dead Bug Alternating Arm Extension - 1 x daily - 7 x weekly - 2 sets - 10 reps Supine March - 1 x daily - 7 x weekly - 2 sets - 10 reps Prone Quadriceps Stretch with Strap - 1 x daily - 7 x weekly - 1 sets - 1 reps - 30 sec hold Eulis Foster, PT [facility] [address], [address] [place], [place] [address] W: [phone] [email or web address]

## 2021-02-20 ENCOUNTER — Encounter: Payer: Self-pay | Admitting: Physical Therapy

## 2021-02-21 DIAGNOSIS — F330 Major depressive disorder, recurrent, mild: Secondary | ICD-10-CM | POA: Diagnosis not present

## 2021-02-21 DIAGNOSIS — F4310 Post-traumatic stress disorder, unspecified: Secondary | ICD-10-CM | POA: Diagnosis not present

## 2021-02-21 DIAGNOSIS — F411 Generalized anxiety disorder: Secondary | ICD-10-CM | POA: Diagnosis not present

## 2021-02-27 ENCOUNTER — Encounter: Payer: Self-pay | Admitting: Physical Therapy

## 2021-03-06 ENCOUNTER — Encounter: Payer: Self-pay | Admitting: Physical Therapy

## 2021-03-08 NOTE — Progress Notes (Deleted)
NEUROLOGY CONSULTATION NOTE  Rose Schwartz MRN: [id number] DOB: [birth year]  Referring provider: Doralee Albino, MD Primary care provider: Dana Allan, MD  Reason for consult:  headache  Assessment/Plan:   ***   Subjective:  Rose Schwartz is a 20 year old female with Bipolar 1 disorder and asthma who presents for headaches.  History supplemented by primary care note.  Onset:  20 years old Location:  across forehead to vertex Quality:  *** Intensity:  ***.  *** denies new headache, thunderclap headache or severe headache that wakes *** from sleep. Aura:  absent Prodrome:  absent Associated symptoms:  ***.  *** denies associated unilateral numbness or weakness. Duration:  several hours to 2 days Frequency:  3 times a month Frequency of abortive medication: *** Triggers:  *** Relieving factors:  sleep Activity:  aggravates  Current NSAIDS/analgesics:  *** Current triptans:  *** Current ergotamine:  *** Current anti-emetic:  *** Current muscle relaxants:  *** Current Antihypertensive medications:  *** Current Antidepressant medications:  *** Current Anticonvulsant medications:  *** Current anti-CGRP:  *** Current Vitamins/Herbal/Supplements:  *** Current Antihistamines/Decongestants:  *** Other therapy:  *** Hormone/birth control:  *** Other medications:  ***  Past NSAIDS/analgesics:  *** Past abortive triptans:  *** Past abortive ergotamine:  *** Past muscle relaxants:  *** Past anti-emetic:  *** Past antihypertensive medications:  *** Past antidepressant medications:  *** Past anticonvulsant medications:  *** Past anti-CGRP:  *** Past vitamins/Herbal/Supplements:  *** Past antihistamines/decongestants:  *** Other past therapies:  ***  She reports that she is concerned about aneurysms.  She spoke to a friend who told her that she herself has migraines and was found to have several small aneurysms.  Patient has no family history of cerebral  aneurysms.    Caffeine:  *** Alcohol:  *** Smoker:  *** Diet:  *** Exercise:  *** Depression:  ***; Anxiety:  *** Other pain:  *** Sleep hygiene:  *** Family history of headache:  ***      PAST MEDICAL HISTORY: Past Medical History:  Diagnosis Date   Anemia    Asthma    Bipolar 1 disorder ([facility])    Frequent urination 10/11/2020   UTI (urinary tract infection)     PAST SURGICAL HISTORY: Past Surgical History:  Procedure Laterality Date   CYST EXCISION Left 03/21/2020   Procedure: Left wrist ganglion cyst excision;  Surgeon: [name], MD;  Location: [facility];  Service: Plastics;  Laterality: Left;  total case time is 1 hour   FETAL AMNIOTIC BAND RELEASE     TRIGGER FINGER RELEASE Left 03/21/2020   Procedure: first dorsal extensor compartment release;  Surgeon: [name], MD;  Location: [facility];  Service: Plastics;  Laterality: Left;    MEDICATIONS: Current Outpatient Medications on File Prior to Visit  Medication Sig Dispense Refill   albuterol (VENTOLIN HFA) 108 (90 Base) MCG/ACT inhaler Inhale 2 puffs into the lungs every 4 (four) hours as needed for wheezing or shortness of breath (cough, shortness of breath or wheezing.). 1 Inhaler 1   ibuprofen (ADVIL) 600 MG tablet Take 1 tablet (600 mg total) by mouth every 6 (six) hours. 30 tablet 0   No current facility-administered medications on file prior to visit.    ALLERGIES: Allergies  Allergen Reactions   Other Anaphylaxis and Shortness Of Breath    Tree nuts    FAMILY HISTORY: Family History  Problem Relation Age of Onset   Fibromyalgia Mother  Objective:  *** General: No acute distress.  Patient appears well-groomed.   Head:  Normocephalic/atraumatic Eyes:  fundi examined but not visualized Neck: supple, no paraspinal tenderness, full range of motion Back: No paraspinal tenderness Heart: regular rate and rhythm Lungs: Clear to auscultation  bilaterally. Vascular: No carotid bruits. Neurological Exam: Mental status: alert and oriented to person, place, and time, recent and remote memory intact, fund of knowledge intact, attention and concentration intact, speech fluent and not dysarthric, language intact. Cranial nerves: CN I: not tested CN II: pupils equal, round and reactive to light, visual fields intact CN III, IV, VI:  full range of motion, no nystagmus, no ptosis CN V: facial sensation intact. CN VII: upper and lower face symmetric CN VIII: hearing intact CN IX, X: gag intact, uvula midline CN XI: sternocleidomastoid and trapezius muscles intact CN XII: tongue midline Bulk & Tone: normal, no fasciculations. Motor:  muscle strength 5/5 throughout Sensation:  Pinprick, temperature and vibratory sensation intact. Deep Tendon Reflexes:  2+ throughout,  toes downgoing.   Finger to nose testing:  Without dysmetria.   Heel to shin:  Without dysmetria.   Gait:  Normal station and stride.  Romberg negative.    Thank you for allowing me to take part in the care of this patient.  Shon Millet, DO  CC: ***

## 2021-03-09 ENCOUNTER — Ambulatory Visit: Payer: Medicaid Other | Admitting: Neurology

## 2021-04-03 DIAGNOSIS — F330 Major depressive disorder, recurrent, mild: Secondary | ICD-10-CM | POA: Diagnosis not present

## 2021-04-03 DIAGNOSIS — F4310 Post-traumatic stress disorder, unspecified: Secondary | ICD-10-CM | POA: Diagnosis not present

## 2021-04-03 DIAGNOSIS — F411 Generalized anxiety disorder: Secondary | ICD-10-CM | POA: Diagnosis not present

## 2021-04-16 ENCOUNTER — Ambulatory Visit (INDEPENDENT_AMBULATORY_CARE_PROVIDER_SITE_OTHER): Payer: Medicaid Other | Admitting: Family Medicine

## 2021-04-16 ENCOUNTER — Other Ambulatory Visit: Payer: Self-pay

## 2021-04-16 ENCOUNTER — Other Ambulatory Visit (HOSPITAL_COMMUNITY)
Admission: RE | Admit: 2021-04-16 | Discharge: 2021-04-16 | Disposition: A | Discharge status: Home / Self Care | Payer: Medicaid Other | Source: Ambulatory Visit | Attending: Family Medicine | Admitting: Family Medicine

## 2021-04-16 ENCOUNTER — Encounter: Payer: Self-pay | Admitting: Family Medicine

## 2021-04-16 VITALS — BP 121/78 | HR 114 | Wt 138.0 lb

## 2021-04-16 DIAGNOSIS — R102 Pelvic and perineal pain: Secondary | ICD-10-CM | POA: Diagnosis not present

## 2021-04-16 DIAGNOSIS — Z01411 Encounter for gynecological examination (general) (routine) with abnormal findings: Secondary | ICD-10-CM

## 2021-04-16 NOTE — Progress Notes (Signed)
Subjective:     Rose Schwartz is a 20 y.o. female and is here for a comprehensive physical exam. The patient reports problems - pelvic pain  Has been here for same. Improved with pelvic PT> Has IUD in place. Occasional cycle.  Does not want a pregnancy now Reports vaginal discharge.     The following portions of the patient's history were reviewed and updated as appropriate: allergies, current medications, past family history, past medical history, past social history, past surgical history, and problem list.  Review of Systems Pertinent items noted in HPI and remainder of comprehensive ROS otherwise negative.   Objective:    BP 121/78   Pulse (!) 114   Wt 138 lb (62.6 kg)   BMI 26.07 kg/m  General appearance: alert, cooperative, and appears stated age Head: Normocephalic, without obvious abnormality, atraumatic Neck: no adenopathy, supple, symmetrical, trachea midline, and thyroid not enlarged, symmetric, no tenderness/mass/nodules Lungs: clear to auscultation bilaterally Breasts: normal appearance, no masses or tenderness Heart: regular rate and rhythm, S1, S2 normal, no murmur, click, rub or gallop Abdomen: soft, non-tender; bowel sounds normal; no masses,  no organomegaly Pelvic: cervix normal in appearance, external genitalia normal, no adnexal masses or tenderness, no cervical motion tenderness, uterus normal size, shape, and consistency, and vagina normal without discharge Extremities: extremities normal, atraumatic, no cyanosis or edema Pulses: 2+ and symmetric Skin: Skin color, texture, turgor normal. No rashes or lesions Lymph nodes: Cervical, supraclavicular, and axillary nodes normal. Neurologic: Grossly normal    Assessment:    Healthy female exam.      Plan:    Pelvic pain - check u/s and cultures - Plan: Cervicovaginal ancillary only( [place]), US PELVIC COMPLETE WITH TRANSVAGINAL  Encounter for gynecological examination with abnormal  finding  Return in 1 year (on 04/16/2022).  See After Visit Summary for Counseling Recommendations

## 2021-04-16 NOTE — Patient Instructions (Signed)
Preventive Care 21-22 Years Old, Female Preventive care refers to lifestyle choices and visits with your health care provider that can promote health and wellness. This includes: A yearly physical exam. This is also called an annual wellness visit. Regular dental and eye exams. Immunizations. Screening for certain conditions. Healthy lifestyle choices, such as: Eating a healthy diet. Getting regular exercise. Not using drugs or products that contain nicotine and tobacco. Limiting alcohol use. What can I expect for my preventive care visit? Physical exam Your health care provider may check your: Height and weight. These may be used to calculate your BMI (body mass index). BMI is a measurement that tells if you are at a healthy weight. Heart rate and blood pressure. Body temperature. Skin for abnormal spots. Counseling Your health care provider may ask you questions about your: Past medical problems. Family's medical history. Alcohol, tobacco, and drug use. Emotional well-being. Home life and relationship well-being. Sexual activity. Diet, exercise, and sleep habits. Work and work environment. Access to firearms. Method of birth control. Menstrual cycle. Pregnancy history. What immunizations do I need? Vaccines are usually given at various ages, according to a schedule. Your health care provider will recommend vaccines for you based on your age, medical history, and lifestyle or other factors, such as travel or where you work. What tests do I need? Blood tests Lipid and cholesterol levels. These may be checked every 5 years starting at age 20. Hepatitis C test. Hepatitis B test. Screening Diabetes screening. This is done by checking your blood sugar (glucose) after you have not eaten for a while (fasting). STD (sexually transmitted disease) testing, if you are at risk. BRCA-related cancer screening. This may be done if you have a family history of breast, ovarian, tubal, or  peritoneal cancers. Pelvic exam and Pap test. This may be done every 3 years starting at age 21. Starting at age 30, this may be done every 5 years if you have a Pap test in combination with an HPV test. Talk with your health care provider about your test results, treatment options, and if necessary, the need for more tests. Follow these instructions at home: Eating and drinking  Eat a healthy diet that includes fresh fruits and vegetables, whole grains, lean protein, and low-fat dairy products. Take vitamin and mineral supplements as recommended by your health care provider. Do not drink alcohol if: Your health care provider tells you not to drink. You are pregnant, may be pregnant, or are planning to become pregnant. If you drink alcohol: Limit how much you have to 0-1 drink a day. Be aware of how much alcohol is in your drink. In the [place] one drink equals one 12 oz bottle of beer (355 mL), one 5 oz glass of wine (148 mL), or one 1 oz glass of hard liquor (44 mL). Lifestyle Take daily care of your teeth and gums. Brush your teeth every morning and night with fluoride toothpaste. Floss one time each day. Stay active. Exercise for at least 30 minutes 5 or more days each week. Do not use any products that contain nicotine or tobacco, such as cigarettes, e-cigarettes, and chewing tobacco. If you need help quitting, ask your health care provider. Do not use drugs. If you are sexually active, practice safe sex. Use a condom or other form of protection to prevent STIs (sexually transmitted infections). If you do not wish to become pregnant, use a form of birth control. If you plan to become pregnant, see your health care provider   for a prepregnancy visit. Find healthy ways to cope with stress, such as: Meditation, yoga, or listening to music. Journaling. Talking to a trusted person. Spending time with friends and family. Safety Always wear your seat belt while driving or riding in a  vehicle. Do not drive: If you have been drinking alcohol. Do not ride with someone who has been drinking. When you are tired or distracted. While texting. Wear a helmet and other protective equipment during sports activities. If you have firearms in your house, make sure you follow all gun safety procedures. Seek help if you have been physically or sexually abused. What's next? Go to your health care provider once a year for an annual wellness visit. Ask your health care provider how often you should have your eyes and teeth checked. Stay up to date on all vaccines. This information is not intended to replace advice given to you by your health care provider. Make sure you discuss any questions you have with your health care provider. Document Revised: 09/01/2020 Document Reviewed: 03/05/2018 Elsevier Patient Education  2022 Elsevier Inc.  

## 2021-04-17 LAB — POCT PREGNANCY, URINE: Preg Test, Ur: NEGATIVE

## 2021-04-17 LAB — CERVICOVAGINAL ANCILLARY ONLY
Bacterial Vaginitis (gardnerella): POSITIVE — AB
Candida Glabrata: NEGATIVE
Candida Vaginitis: POSITIVE — AB
Chlamydia: NEGATIVE
Comment: NEGATIVE
Comment: NEGATIVE
Comment: NEGATIVE
Comment: NEGATIVE
Comment: NEGATIVE
Comment: NORMAL
Neisseria Gonorrhea: NEGATIVE
Trichomonas: NEGATIVE

## 2021-04-17 MED ORDER — METRONIDAZOLE 500 MG PO TABS: 500.0000 mg | ORAL_TABLET | Freq: Two times a day (BID) | ORAL | 0 refills | Status: DC

## 2021-04-17 MED ORDER — FLUCONAZOLE 150 MG PO TABS: 150.0000 mg | ORAL_TABLET | Freq: Every day | ORAL | 2 refills | Status: DC

## 2021-04-17 NOTE — Addendum Note (Signed)
Addended by: Reva Bores on: 04/17/2021 01:56 PM   Modules accepted: Orders

## 2021-04-19 DIAGNOSIS — F4310 Post-traumatic stress disorder, unspecified: Secondary | ICD-10-CM | POA: Diagnosis not present

## 2021-04-19 DIAGNOSIS — F411 Generalized anxiety disorder: Secondary | ICD-10-CM | POA: Diagnosis not present

## 2021-04-19 DIAGNOSIS — F330 Major depressive disorder, recurrent, mild: Secondary | ICD-10-CM | POA: Diagnosis not present

## 2021-04-23 ENCOUNTER — Inpatient Hospital Stay: Admission: RE | Admit: 2021-04-23 | Payer: Medicaid Other | Source: Ambulatory Visit

## 2021-04-25 ENCOUNTER — Other Ambulatory Visit: Payer: Self-pay

## 2021-04-25 ENCOUNTER — Ambulatory Visit
Admission: RE | Admit: 2021-04-25 | Discharge: 2021-04-25 | Disposition: A | Discharge status: Home / Self Care | Payer: Medicaid Other | Source: Ambulatory Visit | Attending: Family Medicine | Admitting: Family Medicine

## 2021-04-25 DIAGNOSIS — R102 Pelvic and perineal pain: Secondary | ICD-10-CM | POA: Diagnosis not present

## 2021-04-26 DIAGNOSIS — F411 Generalized anxiety disorder: Secondary | ICD-10-CM | POA: Diagnosis not present

## 2021-04-26 DIAGNOSIS — F330 Major depressive disorder, recurrent, mild: Secondary | ICD-10-CM | POA: Diagnosis not present

## 2021-04-26 DIAGNOSIS — F4310 Post-traumatic stress disorder, unspecified: Secondary | ICD-10-CM | POA: Diagnosis not present

## 2021-05-02 DIAGNOSIS — F4310 Post-traumatic stress disorder, unspecified: Secondary | ICD-10-CM | POA: Diagnosis not present

## 2021-05-02 DIAGNOSIS — F330 Major depressive disorder, recurrent, mild: Secondary | ICD-10-CM | POA: Diagnosis not present

## 2021-05-02 DIAGNOSIS — F411 Generalized anxiety disorder: Secondary | ICD-10-CM | POA: Diagnosis not present

## 2021-06-04 NOTE — Progress Notes (Deleted)
NEUROLOGY CONSULTATION NOTE  BRITTEN PARADY MRN: [id number] DOB: 09-15-2000  Referring provider: Dana Allan, MD Primary care provider: Dana Allan, MD  Reason for consult:  headache  Assessment/Plan:   ***   Subjective:  Rose Schwartz is a 20 year old female with asthma and Bipolar 1 disorder who presents for headaches.  History supplemented by primary care note.  Onset:  20 years old Location:  frontal, vertex Quality:  *** Intensity:  ***.  *** denies new headache, thunderclap headache or severe headache that wakes *** from sleep. Aura:  *** Prodrome:  *** Postdrome:  *** Associated symptoms:  ***.  *** denies associated unilateral numbness or weakness. Duration:  several hours (sometimes up to 2 days) Frequency:  three times a month Frequency of abortive medication: *** Triggers:  *** Relieving factors:  *** Activity:  ***  Current NSAIDS/analgesics:  ibuprofen, Tylenol Current triptans:  none Current ergotamine:  none Current anti-emetic:  none Current muscle relaxants:  none Current Antihypertensive medications:  none Current Antidepressant medications:  none Current Anticonvulsant medications:  none Current anti-CGRP:  none Current Vitamins/Herbal/Supplements:  none Current Antihistamines/Decongestants:  none Other therapy:  none Hormone/birth control:  none   Past NSAIDS/analgesics:  *** Past abortive triptans:  none Past abortive ergotamine:  none Past muscle relaxants:  none Past anti-emetic:  Zofran Past antihypertensive medications:  none Past antidepressant medications:  none Past anticonvulsant medications:  none Past anti-CGRP:  none Past vitamins/Herbal/Supplements:  none Past antihistamines/decongestants:  none Other past therapies:  ***  Caffeine:  *** Alcohol:  *** Smoker:  *** Diet:  *** Exercise:  *** Depression:  ***; Anxiety:  *** Other pain:  *** Sleep hygiene:  Good Family history of headache:  No family  history of aneurysms.  Concerned about aneurysms because somebody also with headaches reported that she was found to have aneurysms.      PAST MEDICAL HISTORY: Past Medical History:  Diagnosis Date   Anemia    Asthma    Bipolar 1 disorder (HCC)    Frequent urination 10/11/2020   UTI (urinary tract infection)     PAST SURGICAL HISTORY: Past Surgical History:  Procedure Laterality Date   CYST EXCISION Left 03/21/2020   Procedure: Left wrist ganglion cyst excision;  Surgeon: [name], MD;  Location: [facility];  Service: Plastics;  Laterality: Left;  total case time is 1 hour   FETAL AMNIOTIC BAND RELEASE     TRIGGER FINGER RELEASE Left 03/21/2020   Procedure: first dorsal extensor compartment release;  Surgeon: [name], MD;  Location:  SURGERY CENTER;  Service: Plastics;  Laterality: Left;    MEDICATIONS: Current Outpatient Medications on File Prior to Visit  Medication Sig Dispense Refill   albuterol (VENTOLIN HFA) 108 (90 Base) MCG/ACT inhaler Inhale 2 puffs into the lungs every 4 (four) hours as needed for wheezing or shortness of breath (cough, shortness of breath or wheezing.). 1 Inhaler 1   fluconazole (DIFLUCAN) 150 MG tablet Take 1 tablet (150 mg total) by mouth daily. Repeat in 24 hours if needed 2 tablet 2   ibuprofen (ADVIL) 600 MG tablet Take 1 tablet (600 mg total) by mouth every 6 (six) hours. 30 tablet 0   metroNIDAZOLE (FLAGYL) 500 MG tablet Take 1 tablet (500 mg total) by mouth 2 (two) times daily. 14 tablet 0   No current facility-administered medications on file prior to visit.    ALLERGIES: Allergies  Allergen Reactions   Other  Anaphylaxis and Shortness Of Breath    Tree nuts    FAMILY HISTORY: Family History  Problem Relation Age of Onset   Fibromyalgia Mother     Objective:  *** General: No acute distress.  Patient appears well-groomed.   Head:  Normocephalic/atraumatic Eyes:  fundi examined but not  visualized Neck: supple, no paraspinal tenderness, full range of motion Back: No paraspinal tenderness Heart: regular rate and rhythm Lungs: Clear to auscultation bilaterally. Vascular: No carotid bruits. Neurological Exam: Mental status: alert and oriented to person, place, and time, recent and remote memory intact, fund of knowledge intact, attention and concentration intact, speech fluent and not dysarthric, language intact. Cranial nerves: CN I: not tested CN II: pupils equal, round and reactive to light, visual fields intact CN III, IV, VI:  full range of motion, no nystagmus, no ptosis CN V: facial sensation intact. CN VII: upper and lower face symmetric CN VIII: hearing intact CN IX, X: gag intact, uvula midline CN XI: sternocleidomastoid and trapezius muscles intact CN XII: tongue midline Bulk & Tone: normal, no fasciculations. Motor:  muscle strength 5/5 throughout Sensation:  Pinprick, temperature and vibratory sensation intact. Deep Tendon Reflexes:  2+ throughout,  toes downgoing.   Finger to nose testing:  Without dysmetria.   Heel to shin:  Without dysmetria.   Gait:  Normal station and stride.  Romberg negative.    Thank you for allowing me to take part in the care of this patient.  Shon Millet, DO  CC: ***

## 2021-06-05 ENCOUNTER — Other Ambulatory Visit: Payer: Self-pay

## 2021-06-05 ENCOUNTER — Ambulatory Visit: Payer: Medicaid Other | Admitting: Neurology

## 2021-06-06 ENCOUNTER — Telehealth: Payer: Self-pay

## 2021-06-06 DIAGNOSIS — Z91018 Allergy to other foods: Secondary | ICD-10-CM | POA: Diagnosis not present

## 2021-06-06 DIAGNOSIS — J301 Allergic rhinitis due to pollen: Secondary | ICD-10-CM | POA: Diagnosis not present

## 2021-06-06 DIAGNOSIS — J3089 Other allergic rhinitis: Secondary | ICD-10-CM | POA: Diagnosis not present

## 2021-06-06 DIAGNOSIS — J3081 Allergic rhinitis due to animal (cat) (dog) hair and dander: Secondary | ICD-10-CM | POA: Diagnosis not present

## 2021-06-06 NOTE — Telephone Encounter (Signed)
Patient calls nurse line requesting to schedule appointment for chest pain. Patient reports that she has been having intermittent chest pain for at least the last two months. Denies difficulty breathing, radiating pain, nausea/vomiting or jaw tightness. Patient brought up concern with allergy specialist this morning, recommended that she contact PCP for follow up.   Follow up appointment made on 12/6 at 1:30. Strict ED precautions given.   Veronda Prude, RN

## 2021-06-10 ENCOUNTER — Other Ambulatory Visit: Payer: Self-pay

## 2021-06-10 ENCOUNTER — Inpatient Hospital Stay (HOSPITAL_COMMUNITY)
Admission: AD | Admit: 2021-06-10 | Discharge: 2021-06-10 | Disposition: A | Discharge status: Home / Self Care | Payer: Medicaid Other | Attending: Obstetrics and Gynecology | Admitting: Obstetrics and Gynecology

## 2021-06-10 DIAGNOSIS — R519 Headache, unspecified: Secondary | ICD-10-CM | POA: Insufficient documentation

## 2021-06-10 DIAGNOSIS — Z975 Presence of (intrauterine) contraceptive device: Secondary | ICD-10-CM | POA: Insufficient documentation

## 2021-06-10 DIAGNOSIS — R109 Unspecified abdominal pain: Secondary | ICD-10-CM | POA: Diagnosis not present

## 2021-06-10 DIAGNOSIS — Z3200 Encounter for pregnancy test, result unknown: Secondary | ICD-10-CM | POA: Insufficient documentation

## 2021-06-10 DIAGNOSIS — Z3202 Encounter for pregnancy test, result negative: Secondary | ICD-10-CM

## 2021-06-10 LAB — POCT PREGNANCY, URINE: Preg Test, Ur: NEGATIVE

## 2021-06-10 NOTE — MAU Note (Signed)
Presents with abdominal pain/cramping, rectal pain and H/A.  Reports it hurts to move her eyes.  Denies VB.  Currently has IUD, hasn't taken a HPT.

## 2021-06-10 NOTE — MAU Provider Note (Signed)
Chief Complaint: Abdominal Pain and Headache   Event Date/Time   First Provider Initiated Contact with Patient 06/10/21 1900      SUBJECTIVE HPI: Rose Schwartz is a 20 y.o. G1P1001 non pregnant female, here in MAU with complaints of lower abdominal pain and HA that started a few hours ago while she was at [employer]. She has an IUD in place.   Past Medical History:  Diagnosis Date   Anemia    Asthma    Bipolar 1 disorder (HCC)    Frequent urination 10/11/2020   UTI (urinary tract infection)    Past Surgical History:  Procedure Laterality Date   CYST EXCISION Left 03/21/2020   Procedure: Left wrist ganglion cyst excision;  Surgeon: [name], MD;  Location: [facility];  Service: Plastics;  Laterality: Left;  total case time is 1 hour   FETAL AMNIOTIC BAND RELEASE     TRIGGER FINGER RELEASE Left 03/21/2020   Procedure: first dorsal extensor compartment release;  Surgeon: [name], MD;  Location: [facility];  Service: Plastics;  Laterality: Left;   Social History   Socioeconomic History   Marital status: Single    Spouse name: Not on file   Number of children: Not on file   Years of education: Not on file   Highest education level: Not on file  Occupational History   Not on file  Tobacco Use   Smoking status: Never   Smokeless tobacco: Never  Vaping Use   Vaping Use: Never used  Substance and Sexual Activity   Alcohol use: Yes    Comment: SOCIAL   Drug use: Not Currently    Types: Marijuana    Comment: few years ago   Sexual activity: Yes    Partners: Male    Birth control/protection: I.U.D.  Other Topics Concern   Not on file  Social History Narrative   Not on file   Social Determinants of Health   Financial Resource Strain: Not on file  Food Insecurity: Food Insecurity Present   Worried About Running Out of Food in the Last Year: Never true   Ran Out of Food in the Last Year: Sometimes true  Transportation  Needs: No Transportation Needs   Lack of Transportation (Medical): No   Lack of Transportation (Non-Medical): No  Physical Activity: Not on file  Stress: No Stress Concern Present   Feeling of Stress : Only a little  Social Connections: Not on file  Intimate Partner Violence: Not on file   No current facility-administered medications on file prior to encounter.   Current Outpatient Medications on File Prior to Encounter  Medication Sig Dispense Refill   albuterol (VENTOLIN HFA) 108 (90 Base) MCG/ACT inhaler Inhale 2 puffs into the lungs every 4 (four) hours as needed for wheezing or shortness of breath (cough, shortness of breath or wheezing.). 1 Inhaler 1   fluconazole (DIFLUCAN) 150 MG tablet Take 1 tablet (150 mg total) by mouth daily. Repeat in 24 hours if needed 2 tablet 2   ibuprofen (ADVIL) 600 MG tablet Take 1 tablet (600 mg total) by mouth every 6 (six) hours. 30 tablet 0   metroNIDAZOLE (FLAGYL) 500 MG tablet Take 1 tablet (500 mg total) by mouth 2 (two) times daily. 14 tablet 0   Allergies  Allergen Reactions   Other Anaphylaxis and Shortness Of Breath    Tree nuts   Results for orders placed or performed during the hospital encounter of  06/10/21 (from the past 48 hour(s))  Pregnancy, urine POC     Status: None   Collection Time: 06/10/21  5:52 PM  Result Value Ref Range   Preg Test, Ur NEGATIVE NEGATIVE    Comment:        THE SENSITIVITY OF THIS METHODOLOGY IS >24 mIU/mL      ROS:  Review of Systems  Gastrointestinal:  Positive for abdominal pain. Negative for nausea and vomiting.  Genitourinary:  Negative for vaginal bleeding.  Neurological:  Positive for headaches.   I have reviewed patient's Past Medical Hx, Surgical Hx, Family Hx, Social Hx, medications and allergies.   Physical Exam  Patient Vitals for the past 24 hrs:  BP Temp Temp src Pulse Resp SpO2 Height Weight  06/10/21 1836 124/75 98.7 F (37.1 C) Oral 96 18 100 % -- --  06/10/21 1831 -- -- -- --  -- -- 5\' 1"  (1.549 m) 64.4 kg   Physical Exam Vitals and nursing note reviewed.  Constitutional:      General: She is not in acute distress.    Appearance: She is well-developed. She is not ill-appearing, toxic-appearing or diaphoretic.  Skin:    General: Skin is warm.  Neurological:     Mental Status: She is alert and oriented to person, place, and time.    MDM Patient denies any concerning symptoms in need of emergent evaluation. Patient advised that she may transfer to [facility] ED for further evaluation or call PCP/GYN in the AM. Patient would like to leave and call provider in the AM.   ASSESSMENT MSE Complete Non pregnant female   PLAN  Stable DC home.  Return to [facility] ED if symptoms worsen    Ajeenah Heiny, [facility], NP 06/10/2021 7:19 PM

## 2021-06-12 ENCOUNTER — Other Ambulatory Visit: Payer: Self-pay

## 2021-06-12 ENCOUNTER — Ambulatory Visit (HOSPITAL_COMMUNITY)
Admission: RE | Admit: 2021-06-12 | Discharge: 2021-06-12 | Disposition: A | Discharge status: Home / Self Care | Payer: Medicaid Other | Source: Ambulatory Visit | Attending: Family Medicine | Admitting: Family Medicine

## 2021-06-12 ENCOUNTER — Ambulatory Visit (INDEPENDENT_AMBULATORY_CARE_PROVIDER_SITE_OTHER): Payer: Medicaid Other | Admitting: Family Medicine

## 2021-06-12 VITALS — BP 102/74 | HR 85 | Ht 61.0 in | Wt 140.4 lb

## 2021-06-12 DIAGNOSIS — R079 Chest pain, unspecified: Secondary | ICD-10-CM

## 2021-06-12 DIAGNOSIS — Z23 Encounter for immunization: Secondary | ICD-10-CM

## 2021-06-12 NOTE — Progress Notes (Signed)
    SUBJECTIVE:   CHIEF COMPLAINT: viral symptoms HPI:   Rose Schwartz is a 20 y.o. yo with history notable for mood disorder presenting for follow-up for her chronic chest pain as well as viral respiratory infection.   The patient reports her biggest concern today is her viral respiratory infection.  She has intermittent sore throat and mild congestion.  She denies fevers, chills, nausea, vomiting, sinus pressure or ear pain.  She has tried Tylenol for symptoms which is helped.  She currently works at a daycare.  She is a 28-year-old child.  She denies recent oral sex.  She has 1 partner who is female.  The patient reports her chest pain is ongoing.  This is been present since June.  She has a family history in her grandfather of a myocardial infarction when he is very old.  She denies history of early myocardial infarction.  She is not sure what symptoms are associate with her chest pain.  The chest pain is sometimes bilateral and often reproducible.  Sometimes it is epigastric in location. Often associated with certain foods. She thinks may be related to anxiety. She is seeing a therapist and psychiatry. At times has palpitations but no recent. Denies chest pain today, dypnea, nocturnal chest pains, diaphoresis with this.   PERTINENT  PMH / PSH/Family/Social History : updated   OBJECTIVE:   BP 102/74   Pulse 85   Ht 5\' 1"  (1.549 m)   Wt 140 lb 6.4 oz (63.7 kg)   SpO2 100%   BMI 26.53 kg/m   Today's weight:  Last Weight  Most recent update: 06/12/2021  1:46 PM    Weight  63.7 kg (140 lb 6.4 oz)            Review of prior weights: Filed Weights   06/12/21 1345  Weight: 140 lb 6.4 oz (63.7 kg)    Cardiac: Regular rate and rhythm. Normal S1/S2. No murmurs, rubs, or gallops appreciated. Lungs: Clear bilaterally to ascultation.  Abdomen: Normoactive bowel sounds. No tenderness to deep or light palpation. No rebound or guarding.  Psych: Pleasant and appropriate  HEENT exam  is completed into panic membranes are normal in appearance with a normal light reflex and no effusion or erythema or bulging.  No abnormalities along the nasal turbinates.  Oropharynx is benign without exudates.  There is no tonsillar hypertrophy.  EKG and is obtained and shows sinus rhythm.   ASSESSMENT/PLAN:   Chest pain Normal EKG and symptoms of pain quality  do not suggest a cardiac etiology. , no CP with exertion or other anginal type symptoms No pleuritic pain, LE edema suggestive of pulmonary embolism.  There is no concerning family history.  Will obtain lipid panel as she has never had 1 and this is recommended by the [employer] TF.  Most likely this is either GERD or related to her anxiety.  Asked her to continue to keep a journal of this.  She is regularly seeing a therapist I suspect this will provide the most benefit.   Viral respiratory symptoms, she is beyond the 5-day window for COVID and out of the window for Tamiflu.  Recommended supportive care.  Encouraged her to push fluids use Ocean Spray nasal saline.  Can use honey for cough.  Reviewed return precautions.    14/06/22, MD  Family Medicine Teaching Service  [facility]

## 2021-06-12 NOTE — Patient Instructions (Addendum)
It was wonderful to see you today.  Please bring ALL of your medications with you to every visit.   Today we talked about:  ---you have a virus  For this I recommend nasal saline--like Ocean Spray Push plenty of fluids Tylenol for discomfort  Your heart EKG looks good--this is normal  We will check your cholesterol---I will send your results via mychart   Thank you for choosing [facility].   Please call [phone] with any questions about today's appointment.  Please be sure to schedule follow up at the front  desk before you leave today.   Terisa Starr, MD  Family Medicine

## 2021-06-12 NOTE — Assessment & Plan Note (Signed)
Normal EKG and symptoms of pain quality  do not suggest a cardiac etiology. , no CP with exertion or other anginal type symptoms No pleuritic pain, LE edema suggestive of pulmonary embolism.  There is no concerning family history.  Will obtain lipid panel as she has never had 1 and this is recommended by the [employer] TF.  Most likely this is either GERD or related to her anxiety.  Asked her to continue to keep a journal of this.  She is regularly seeing a therapist I suspect this will provide the most benefit.

## 2021-06-13 LAB — LIPID PANEL
Chol/HDL Ratio: 3.2 ratio (ref 0.0–4.4)
Cholesterol, Total: 126 mg/dL (ref 100–199)
HDL: 39 mg/dL — ABNORMAL LOW (ref >39)
LDL Chol Calc (NIH): 77 mg/dL (ref 0–99)
Triglycerides: 40 mg/dL (ref 0–149)
VLDL Cholesterol Cal: 10 mg/dL (ref 5–40)

## 2021-06-23 ENCOUNTER — Other Ambulatory Visit: Payer: Self-pay

## 2021-06-23 ENCOUNTER — Encounter (HOSPITAL_COMMUNITY): Payer: Self-pay | Admitting: Emergency Medicine

## 2021-06-23 ENCOUNTER — Ambulatory Visit (HOSPITAL_COMMUNITY)
Admission: EM | Admit: 2021-06-23 | Discharge: 2021-06-23 | Disposition: A | Discharge status: Home / Self Care | Payer: Medicaid Other | Attending: Family Medicine | Admitting: Family Medicine

## 2021-06-23 DIAGNOSIS — R197 Diarrhea, unspecified: Secondary | ICD-10-CM

## 2021-06-23 LAB — POCT URINALYSIS DIPSTICK, ED / UC
Glucose, UA: NEGATIVE mg/dL
Ketones, ur: 80 mg/dL — AB
Leukocytes,Ua: NEGATIVE
Nitrite: NEGATIVE
Protein, ur: 30 mg/dL — AB
Specific Gravity, Urine: >=1.03 (ref 1.005–1.030)
Urobilinogen, UA: 0.2 mg/dL (ref 0.0–1.0)
pH: 6 (ref 5.0–8.0)

## 2021-06-23 LAB — POC URINE PREG, ED: Preg Test, Ur: NEGATIVE

## 2021-06-23 MED ORDER — ONDANSETRON 4 MG PO TBDP: 4.0000 mg | ORAL_TABLET | Freq: Three times a day (TID) | ORAL | 0 refills | Status: DC | PRN

## 2021-06-23 NOTE — ED Triage Notes (Signed)
Onset Thursday of symptoms, abdominal pain, nausea, headache and diarrhea

## 2021-06-25 NOTE — ED Provider Notes (Signed)
[facility]   [id number] 06/23/21 Arrival Time: 1349  ASSESSMENT & PLAN:  1. Diarrhea, unspecified type    No indication for IVF. Tolerating PO. Benign abdomen.  Meds ordered this encounter  Medications   ondansetron (ZOFRAN-ODT) 4 MG disintegrating tablet    Sig: Take 1 tablet (4 mg total) by mouth every 8 (eight) hours as needed for nausea or vomiting.    Dispense:  15 tablet    Refill:  0   Labs Reviewed  POCT URINALYSIS DIPSTICK, ED / UC - Abnormal; Notable for the following components:      Result Value   Bilirubin Urine LARGE (*)    Ketones, ur 80 (*)    Hgb urine dipstick LARGE (*)    Protein, ur 30 (*)    All other components within normal limits  POC URINE PREG, ED  UPT negative.  Discussed typical duration of symptoms for suspected viral GI illness. Will do her best to ensure adequate fluid intake in order to avoid dehydration. Will proceed to the Emergency Department for evaluation if unable to tolerate PO fluids regularly.  Otherwise she will f/u with her PCP or here if not showing improvement over the next 48-72 hours.  Reviewed expectations re: course of current medical issues. Questions answered. Outlined signs and symptoms indicating need for more acute intervention. Patient verbalized understanding. After Visit Summary given.   SUBJECTIVE: History from: patient.  Rose Schwartz is a 20 y.o. female who presents with complaint of abd cramping and non-bloody loose stools; x 2-3 d; not worsening. Afebrile. No current abd pain. Tolerating PO intake with occas nausea. No tx PTA. Normal urination.  No LMP recorded. (Menstrual status: IUD).  Past Surgical History:  Procedure Laterality Date   CYST EXCISION Left 03/21/2020   Procedure: Left wrist ganglion cyst excision;  Surgeon: [name], MD;  Location: [facility];  Service: Plastics;  Laterality: Left;  total case time is 1 hour   FETAL AMNIOTIC BAND RELEASE      TRIGGER FINGER RELEASE Left 03/21/2020   Procedure: first dorsal extensor compartment release;  Surgeon: [name], MD;  Location: [facility];  Service: Plastics;  Laterality: Left;     OBJECTIVE:  Vitals:   06/23/21 1422  BP: 116/79  Pulse: (!) 102  Resp: 18  Temp: 98.3 F (36.8 C)  TempSrc: Oral  SpO2: 99%    Recheck P: 94 General appearance: alert; no distress Oropharynx: slightly dry Lungs: clear to auscultation bilaterally; unlabored Heart: regular rate and rhythm Abdomen: soft; non-distended; no significant abdominal tenderness; reports "cramping" feeling; bowel sounds present; no masses or organomegaly; no guarding or rebound tenderness Back: no CVA tenderness Extremities: no edema; symmetrical with no gross deformities Skin: warm; dry Neurologic: normal gait Psychological: alert and cooperative; normal mood and affect  Labs: Results for orders placed or performed during the hospital encounter of 06/23/21  POC Urinalysis dipstick  Result Value Ref Range   Glucose, UA NEGATIVE NEGATIVE mg/dL   Bilirubin Urine LARGE (A) NEGATIVE   Ketones, ur 80 (A) NEGATIVE mg/dL   Specific Gravity, Urine >=1.030 1.005 - 1.030   Hgb urine dipstick LARGE (A) NEGATIVE   pH 6.0 5.0 - 8.0   Protein, ur 30 (A) NEGATIVE mg/dL   Urobilinogen, UA 0.2 0.0 - 1.0 mg/dL   Nitrite NEGATIVE NEGATIVE   Leukocytes,Ua NEGATIVE NEGATIVE  POC urine pregnancy  Result Value Ref Range   Preg Test, Ur NEGATIVE NEGATIVE  Labs Reviewed  POCT URINALYSIS DIPSTICK, ED / UC - Abnormal; Notable for the following components:      Result Value   Bilirubin Urine LARGE (*)    Ketones, ur 80 (*)    Hgb urine dipstick LARGE (*)    Protein, ur 30 (*)    All other components within normal limits  POC URINE PREG, ED   .  Allergies  Allergen Reactions   Other Anaphylaxis and Shortness Of Breath    Tree nuts                                               Past Medical History:   Diagnosis Date   Anemia    Asthma    Bipolar 1 disorder (HCC)    Frequent urination 10/11/2020   UTI (urinary tract infection)    Social History   Socioeconomic History   Marital status: Single    Spouse name: Not on file   Number of children: Not on file   Years of education: Not on file   Highest education level: Not on file  Occupational History   Not on file  Tobacco Use   Smoking status: Never   Smokeless tobacco: Never  Vaping Use   Vaping Use: Never used  Substance and Sexual Activity   Alcohol use: Yes    Comment: SOCIAL   Drug use: Not Currently    Types: Marijuana    Comment: few years ago   Sexual activity: Yes    Partners: Male    Birth control/protection: I.U.D.  Other Topics Concern   Not on file  Social History Narrative   Not on file   Social Determinants of Health   Financial Resource Strain: Not on file  Food Insecurity: Food Insecurity Present   Worried About Running Out of Food in the Last Year: Never true   Ran Out of Food in the Last Year: Sometimes true  Transportation Needs: No Transportation Needs   Lack of Transportation (Medical): No   Lack of Transportation (Non-Medical): No  Physical Activity: Not on file  Stress: No Stress Concern Present   Feeling of Stress : Only a little  Social Connections: Not on file  Intimate Partner Violence: Not on file   Family History  Problem Relation Age of Onset   Fibromyalgia Mother       Mardella Layman, MD 06/25/21 [phone]

## 2021-06-29 ENCOUNTER — Ambulatory Visit: Payer: Medicaid Other | Admitting: Family Medicine

## 2021-08-16 ENCOUNTER — Encounter (HOSPITAL_COMMUNITY): Payer: Self-pay | Admitting: Emergency Medicine

## 2021-08-16 ENCOUNTER — Other Ambulatory Visit: Payer: Self-pay

## 2021-08-16 ENCOUNTER — Ambulatory Visit (HOSPITAL_COMMUNITY)
Admission: EM | Admit: 2021-08-16 | Discharge: 2021-08-16 | Disposition: A | Discharge status: Home / Self Care | Payer: Medicaid Other | Attending: Internal Medicine | Admitting: Internal Medicine

## 2021-08-16 DIAGNOSIS — A084 Viral intestinal infection, unspecified: Secondary | ICD-10-CM

## 2021-08-16 LAB — POC URINE PREG, ED: Preg Test, Ur: NEGATIVE

## 2021-08-16 MED ORDER — ONDANSETRON 4 MG PO TBDP: 4.0000 mg | ORAL_TABLET | Freq: Three times a day (TID) | ORAL | 0 refills | Status: AC | PRN

## 2021-08-16 MED ORDER — ONDANSETRON 4 MG PO TBDP
ORAL_TABLET | ORAL | Status: AC
  Filled 2021-08-16: qty 1

## 2021-08-16 MED ORDER — ONDANSETRON 4 MG PO TBDP
4.0000 mg | ORAL_TABLET | Freq: Once | ORAL | Status: AC
  Administered 2021-08-16: 4 mg via ORAL

## 2021-08-16 NOTE — ED Triage Notes (Signed)
Sunday started having abdominal pain and diarrhea.  Every day, patient has had diarrhea.  Today had first episode of vomiting,  patient did eat a [employer]-fil[employer] sandwich and fries yesterday.    Legs are feeling aching, no swelling.

## 2021-08-16 NOTE — ED Provider Notes (Signed)
[facility]    CSN: [id number] Arrival date & time: 08/16/21  1615      History   Chief Complaint Chief Complaint  Patient presents with   Diarrhea    HPI Rose Schwartz is a 21 y.o. female comes to the urgent care with 3 to 4 days of nonbloody nonmucoid diarrhea, abdominal cramping and nonbloody vomiting.  Symptoms started a few days ago and has been persistent.  Patient has had about 3 bowel movements yesterday.  Bowel movements were watery with no blood.  Patient denies any abdominal distention.  No fever.  Patient's symptoms apparently started after she ate some [employer] and fries.  No sore throat, runny nose, body aches or headaches.   HPI  Past Medical History:  Diagnosis Date   Anemia    Asthma    Bipolar 1 disorder (HCC)    Frequent urination 10/11/2020   UTI (urinary tract infection)     Patient Active Problem List   Diagnosis Date Noted   Chronic headaches 12/18/2020   Chest pain 12/18/2020   Eczema 10/11/2020   Encounter to establish care with new doctor 06/23/2020   Alpha thalassemia silent carrier 08/04/2019   Asthma    Bipolar 1 disorder [facility])     Past Surgical History:  Procedure Laterality Date   CYST EXCISION Left 03/21/2020   Procedure: Left wrist ganglion cyst excision;  Surgeon: [name], MD;  Location: [facility];  Service: Plastics;  Laterality: Left;  total case time is 1 hour   FETAL AMNIOTIC BAND RELEASE     TRIGGER FINGER RELEASE Left 03/21/2020   Procedure: first dorsal extensor compartment release;  Surgeon: [name], MD;  Location: [facility];  Service: Plastics;  Laterality: Left;    OB History     Gravida  1   Para  1   Term  1   Preterm      AB      Living  1      SAB      IAB      Ectopic      Multiple  0   Live Births  1            Home Medications    Prior to Admission medications   Medication Sig Start Date End Date Taking? Authorizing  Provider  ondansetron (ZOFRAN-ODT) 4 MG disintegrating tablet Take 1 tablet (4 mg total) by mouth every 8 (eight) hours as needed for nausea or vomiting. 08/16/21  Yes Kejuan Bekker, Britta Mccreedy, MD  albuterol (VENTOLIN HFA) 108 (90 Base) MCG/ACT inhaler Inhale 2 puffs into the lungs every 4 (four) hours as needed for wheezing or shortness of breath (cough, shortness of breath or wheezing.). 12/18/18   Elvina Sidle, MD  ibuprofen (ADVIL) 600 MG tablet Take 1 tablet (600 mg total) by mouth every 6 (six) hours. 10/09/20   Katha Cabal, DO    Family History Family History  Problem Relation Age of Onset   Fibromyalgia Mother     Social History Social History   Tobacco Use   Smoking status: Never   Smokeless tobacco: Never  Vaping Use   Vaping Use: Never used  Substance Use Topics   Alcohol use: Yes    Comment: SOCIAL   Drug use: Not Currently    Types: Marijuana    Comment: few years ago     Allergies   Other   Review of Systems Review of Systems  As per HPI  Physical Exam Triage Vital Signs ED Triage Vitals  Enc Vitals Group     BP 08/16/21 1755 114/70     Pulse Rate 08/16/21 1755 93     Resp 08/16/21 1755 18     Temp 08/16/21 1755 99.1 F (37.3 C)     Temp Source 08/16/21 1755 Oral     SpO2 08/16/21 1755 98 %     Weight --      Height --      Head Circumference --      Peak Flow --      Pain Score 08/16/21 1752 0     Pain Loc --      Pain Edu? --      Excl. in GC? --    No data found.  Updated Vital Signs BP 114/70 (BP Location: Left Arm)    Pulse 93    Temp 99.1 F (37.3 C) (Oral)    Resp 18    SpO2 98%   Visual Acuity Right Eye Distance:   Left Eye Distance:   Bilateral Distance:    Right Eye Near:   Left Eye Near:    Bilateral Near:     Physical Exam Constitutional:      Appearance: Normal appearance.  HENT:     Right Ear: Tympanic membrane normal.     Left Ear: Tympanic membrane normal.  Cardiovascular:     Rate and Rhythm: Normal rate and  regular rhythm.     Pulses: Normal pulses.     Heart sounds: Normal heart sounds.  Pulmonary:     Effort: Pulmonary effort is normal.     Breath sounds: Normal breath sounds.  Abdominal:     General: Bowel sounds are normal.     Palpations: Abdomen is soft.  Musculoskeletal:        General: Normal range of motion.  Neurological:     Mental Status: She is alert.     UC Treatments / Results  Labs (all labs ordered are listed, but only abnormal results are displayed) Labs Reviewed  POC URINE PREG, ED    EKG   Radiology No results found.  Procedures Procedures (including critical care time)  Medications Ordered in UC Medications  ondansetron (ZOFRAN-ODT) disintegrating tablet 4 mg (4 mg Oral Given 08/16/21 1848)    Initial Impression / Assessment and Plan / UC Course  I have reviewed the triage vital signs and the nursing notes.  Pertinent labs & imaging results that were available during my care of the patient were reviewed by me and considered in my medical decision making (see chart for details).     1.  Viral gastroenteritis: Patient requested pregnancy test which was negative Maintain adequate hydration Zofran as needed for nausea/vomiting Dose of Zofran was given in the urgent care. Return precautions given. Final Clinical Impressions(s) / UC Diagnoses   Final diagnoses:  Viral gastroenteritis     Discharge Instructions      Increase oral fluid intake Pregnancy test is negative. Take medications as prescribed If you have worsening abdominal pain, abdominal distention, worsening vomiting-please return to urgent care to be reevaluated.   ED Prescriptions     Medication Sig Dispense Auth. Provider   ondansetron (ZOFRAN-ODT) 4 MG disintegrating tablet Take 1 tablet (4 mg total) by mouth every 8 (eight) hours as needed for nausea or vomiting. 20 tablet Noble Cicalese, Britta Mccreedy, MD      PDMP not reviewed this encounter.   Demaris Callander  O, MD 08/17/21  1048

## 2021-08-16 NOTE — Discharge Instructions (Addendum)
Increase oral fluid intake Pregnancy test is negative. Take medications as prescribed If you have worsening abdominal pain, abdominal distention, worsening vomiting-please return to urgent care to be reevaluated.

## 2021-08-28 NOTE — Progress Notes (Unsigned)
NEUROLOGY CONSULTATION NOTE  GERENE NEDD MRN: [id number] DOB: 29-Apr-2001  Referring provider: Dana Allan, MD Primary care provider: Dana Allan, MD  Reason for consult:  headache  Assessment/Plan:   ***   Subjective:  Rose Schwartz is a 21 year old female with Bipolar 1 disorder, asthma and anemia who presents for headaches.  History supplemented by family medicine note.  She has history of headaches since about age 21.  They are ***.  The are well controlled overall, occurring about 3 times a year.  She is concerned because somebody told her that they had migraines and was subsequently found to have several aneurysms.  Denies known family history of aneurysms.    Current NSAIDS/analgesics:  *** Current triptans:  *** Current ergotamine:  *** Current anti-emetic:  *** Current muscle relaxants:  *** Current Antihypertensive medications:  *** Current Antidepressant medications:  *** Current Anticonvulsant medications:  *** Current anti-CGRP:  *** Current Vitamins/Herbal/Supplements:  *** Current Antihistamines/Decongestants:  *** Other therapy:  *** Hormone/birth control:  *** Other medications:  ***  Past NSAIDS/analgesics:  *** Past abortive triptans:  *** Past abortive ergotamine:  *** Past muscle relaxants:  *** Past anti-emetic:  *** Past antihypertensive medications:  *** Past antidepressant medications:  *** Past anticonvulsant medications:  *** Past anti-CGRP:  *** Past vitamins/Herbal/Supplements:  *** Past antihistamines/decongestants:  *** Other past therapies:  ***  Caffeine:  *** Alcohol:  *** Smoker:  *** Diet:  *** Exercise:  *** Depression:  ***; Anxiety:  *** Other pain:  *** Sleep hygiene:  *** Family history of headache:  ***      PAST MEDICAL HISTORY: Past Medical History:  Diagnosis Date   Anemia    Asthma    Bipolar 1 disorder (HCC)    Frequent urination 10/11/2020   UTI (urinary tract infection)     PAST  SURGICAL HISTORY: Past Surgical History:  Procedure Laterality Date   CYST EXCISION Left 03/21/2020   Procedure: Left wrist ganglion cyst excision;  Surgeon: [name], MD;  Location: [facility];  Service: Plastics;  Laterality: Left;  total case time is 1 hour   FETAL AMNIOTIC BAND RELEASE     TRIGGER FINGER RELEASE Left 03/21/2020   Procedure: first dorsal extensor compartment release;  Surgeon: [name], MD;  Location: [facility];  Service: Plastics;  Laterality: Left;    MEDICATIONS: Current Outpatient Medications on File Prior to Visit  Medication Sig Dispense Refill   albuterol (VENTOLIN HFA) 108 (90 Base) MCG/ACT inhaler Inhale 2 puffs into the lungs every 4 (four) hours as needed for wheezing or shortness of breath (cough, shortness of breath or wheezing.). 1 Inhaler 1   ibuprofen (ADVIL) 600 MG tablet Take 1 tablet (600 mg total) by mouth every 6 (six) hours. 30 tablet 0   ondansetron (ZOFRAN-ODT) 4 MG disintegrating tablet Take 1 tablet (4 mg total) by mouth every 8 (eight) hours as needed for nausea or vomiting. 20 tablet 0   No current facility-administered medications on file prior to visit.    ALLERGIES: Allergies  Allergen Reactions   Other Anaphylaxis and Shortness Of Breath    Tree nuts    FAMILY HISTORY: Family History  Problem Relation Age of Onset   Fibromyalgia Mother     Objective:  *** General: No acute distress.  Patient appears well-groomed.   Head:  Normocephalic/atraumatic Eyes:  fundi examined but not visualized Neck: supple, no paraspinal tenderness, full range of motion  Back: No paraspinal tenderness Heart: regular rate and rhythm Lungs: Clear to auscultation bilaterally. Vascular: No carotid bruits. Neurological Exam: Mental status: alert and oriented to person, place, and time, recent and remote memory intact, fund of knowledge intact, attention and concentration intact, speech fluent and not  dysarthric, language intact. Cranial nerves: CN I: not tested CN II: pupils equal, round and reactive to light, visual fields intact CN III, IV, VI:  full range of motion, no nystagmus, no ptosis CN V: facial sensation intact. CN VII: upper and lower face symmetric CN VIII: hearing intact CN IX, X: gag intact, uvula midline CN XI: sternocleidomastoid and trapezius muscles intact CN XII: tongue midline Bulk & Tone: normal, no fasciculations. Motor:  muscle strength 5/5 throughout Sensation:  Pinprick, temperature and vibratory sensation intact. Deep Tendon Reflexes:  2+ throughout,  toes downgoing.   Finger to nose testing:  Without dysmetria.   Heel to shin:  Without dysmetria.   Gait:  Normal station and stride.  Romberg negative.    Thank you for allowing me to take part in the care of this patient.  Shon Millet, DO  CC: ***

## 2021-08-30 ENCOUNTER — Ambulatory Visit: Payer: Medicaid Other | Admitting: Neurology

## 2021-11-01 ENCOUNTER — Ambulatory Visit (HOSPITAL_COMMUNITY)
Admission: EM | Admit: 2021-11-01 | Discharge: 2021-11-01 | Disposition: A | Discharge status: Home / Self Care | Payer: Medicaid Other | Attending: Emergency Medicine | Admitting: Emergency Medicine

## 2021-11-01 ENCOUNTER — Encounter (HOSPITAL_COMMUNITY): Payer: Self-pay | Admitting: Emergency Medicine

## 2021-11-01 DIAGNOSIS — N898 Other specified noninflammatory disorders of vagina: Secondary | ICD-10-CM | POA: Diagnosis not present

## 2021-11-01 DIAGNOSIS — R1030 Lower abdominal pain, unspecified: Secondary | ICD-10-CM | POA: Insufficient documentation

## 2021-11-01 LAB — POCT URINALYSIS DIPSTICK, ED / UC
Bilirubin Urine: NEGATIVE
Glucose, UA: NEGATIVE mg/dL
Hgb urine dipstick: NEGATIVE
Leukocytes,Ua: NEGATIVE
Nitrite: NEGATIVE
Protein, ur: NEGATIVE mg/dL
Specific Gravity, Urine: 1.02 (ref 1.005–1.030)
Urobilinogen, UA: 0.2 mg/dL (ref 0.0–1.0)
pH: 7.5 (ref 5.0–8.0)

## 2021-11-01 LAB — HIV ANTIBODY (ROUTINE TESTING W REFLEX): HIV Screen 4th Generation wRfx: NONREACTIVE

## 2021-11-01 LAB — POC URINE PREG, ED: Preg Test, Ur: NEGATIVE

## 2021-11-01 NOTE — ED Triage Notes (Signed)
Pt reports that today was having lower abd pains. Denies n/vd, or urinary.  ?Pt wants STD testing. Reports that will have vaginal discharge that makes you think you are urinating on yourself. Today noticed fishy odor today.  ?

## 2021-11-01 NOTE — Discharge Instructions (Addendum)
[facility]) can help get you started with mental health care.  ? ?You will get a call if tests are positive, you will not get a call if tests are negative but you can check results in MyChart if you have a MyChart account.   ?

## 2021-11-02 ENCOUNTER — Telehealth (HOSPITAL_COMMUNITY): Payer: Self-pay

## 2021-11-02 LAB — CERVICOVAGINAL ANCILLARY ONLY
Bacterial Vaginitis (gardnerella): POSITIVE — AB
Candida Glabrata: NEGATIVE
Candida Vaginitis: POSITIVE — AB
Chlamydia: NEGATIVE
Comment: NEGATIVE
Comment: NEGATIVE
Comment: NEGATIVE
Comment: NEGATIVE
Comment: NEGATIVE
Comment: NORMAL
Neisseria Gonorrhea: NEGATIVE
Trichomonas: NEGATIVE

## 2021-11-02 LAB — RPR: RPR Ser Ql: NONREACTIVE

## 2021-11-02 MED ORDER — METRONIDAZOLE 500 MG PO TABS: 500.0000 mg | ORAL_TABLET | Freq: Two times a day (BID) | ORAL | 0 refills | Status: DC

## 2021-11-02 MED ORDER — FLUCONAZOLE 150 MG PO TABS: 150.0000 mg | ORAL_TABLET | Freq: Every day | ORAL | 0 refills | Status: DC

## 2021-11-03 NOTE — ED Provider Notes (Signed)
?[facility] ? ? ? ?CSN: [id number] ?Arrival date & time: 11/01/21  1832 ? ? ?  ? ?History   ?Chief Complaint ?Chief Complaint  ?Patient presents with  ? Abdominal Pain  ? ? ?HPI ?Rose Schwartz is a 21 y.o. female. Pt reports that today was having lower abd pains. Denies n/v/d. Reports dysuria. Pt wants STD testing. Reports that will have vaginal discharge that makes you think you are urinating on yourself. Today noticed fishy odor today.. She reports unprotected sex with more than one female partner.  ? ? ?Abdominal Pain ?Associated symptoms: dysuria and vaginal discharge   ?Associated symptoms: no diarrhea, no hematuria, no nausea and no vomiting   ? ?Past Medical History:  ?Diagnosis Date  ? Anemia   ? Asthma   ? Bipolar 1 disorder (HCC)   ? Frequent urination 10/11/2020  ? UTI (urinary tract infection)   ? ? ?Patient Active Problem List  ? Diagnosis Date Noted  ? Chronic headaches 12/18/2020  ? Chest pain 12/18/2020  ? Eczema 10/11/2020  ? Encounter to establish care with new doctor 06/23/2020  ? Alpha thalassemia silent carrier 08/04/2019  ? Asthma   ? Bipolar 1 disorder (HCC)   ? ? ?Past Surgical History:  ?Procedure Laterality Date  ? CYST EXCISION Left 03/21/2020  ? Procedure: Left wrist ganglion cyst excision;  Surgeon: Allena NapoleonPace, Collier S, MD;  Location: [facility];  Service: Plastics;  Laterality: Left;  total case time is 1 hour  ? FETAL AMNIOTIC BAND RELEASE    ? TRIGGER FINGER RELEASE Left 03/21/2020  ? Procedure: first dorsal extensor compartment release;  Surgeon: Allena NapoleonPace, Collier S, MD;  Location: [facility];  Service: Plastics;  Laterality: Left;  ? ? ?OB History   ? ? Gravida  ?1  ? Para  ?1  ? Term  ?1  ? Preterm  ?   ? AB  ?   ? Living  ?1  ?  ? ? SAB  ?   ? IAB  ?   ? Ectopic  ?   ? Multiple  ?0  ? Live Births  ?1  ?   ?  ?  ? ? ? ?Home Medications   ? ?Prior to Admission medications   ?Medication Sig Start Date End Date Taking? Authorizing Provider  ?albuterol  (VENTOLIN HFA) 108 (90 Base) MCG/ACT inhaler Inhale 2 puffs into the lungs every 4 (four) hours as needed for wheezing or shortness of breath (cough, shortness of breath or wheezing.). 12/18/18   Elvina SidleLauenstein, Kurt, MD  ?fluconazole (DIFLUCAN) 150 MG tablet Take 1 tablet (150 mg total) by mouth daily. 11/02/21   LampteyBritta Mccreedy, Philip O, MD  ?ibuprofen (ADVIL) 600 MG tablet Take 1 tablet (600 mg total) by mouth every 6 (six) hours. 10/09/20   Katha CabalBrimage, Vondra, DO  ?metroNIDAZOLE (FLAGYL) 500 MG tablet Take 1 tablet (500 mg total) by mouth 2 (two) times daily. 11/02/21   Lamptey, Britta MccreedyPhilip O, MD  ?ondansetron (ZOFRAN-ODT) 4 MG disintegrating tablet Take 1 tablet (4 mg total) by mouth every 8 (eight) hours as needed for nausea or vomiting. 08/16/21   Lamptey, Britta MccreedyPhilip O, MD  ? ? ?Family History ?Family History  ?Problem Relation Age of Onset  ? Fibromyalgia Mother   ? ? ?Social History ?Social History  ? ?Tobacco Use  ? Smoking status: Never  ? Smokeless tobacco: Never  ?Vaping Use  ? Vaping Use: Never used  ?Substance Use Topics  ? Alcohol use:  Yes  ?  Comment: SOCIAL  ? Drug use: Not Currently  ?  Types: Marijuana  ?  Comment: few years ago  ? ? ? ?Allergies   ?Other ? ? ?Review of Systems ?Review of Systems  ?Gastrointestinal:  Positive for abdominal pain. Negative for diarrhea, nausea and vomiting.  ?Genitourinary:  Positive for dysuria and vaginal discharge. Negative for flank pain, genital sores and hematuria.  ? ? ?Physical Exam ?Triage Vital Signs ?ED Triage Vitals  ?Enc Vitals Group  ?   BP 11/01/21 1915 118/81  ?   Pulse Rate 11/01/21 1915 75  ?   Resp 11/01/21 1915 17  ?   Temp 11/01/21 1915 98 ?F (36.7 ?C)  ?   Temp Source 11/01/21 1915 Oral  ?   SpO2 11/01/21 1915 97 %  ?   Weight --   ?   Height --   ?   Head Circumference --   ?   Peak Flow --   ?   Pain Score 11/01/21 1914 0  ?   Pain Loc --   ?   Pain Edu? --   ?   Excl. in GC? --   ? ?No data found. ? ?Updated Vital Signs ?BP 118/81 (BP Location: Right Arm)   Pulse 75    Temp 98 ?F (36.7 ?C) (Oral)   Resp 17   SpO2 97%  ? ?Visual Acuity ?Right Eye Distance:   ?Left Eye Distance:   ?Bilateral Distance:   ? ?Right Eye Near:   ?Left Eye Near:    ?Bilateral Near:    ? ?Physical Exam ?Constitutional:   ?   General: She is not in acute distress. ?   Appearance: She is well-developed. She is not ill-appearing.  ?Cardiovascular:  ?   Rate and Rhythm: Normal rate and regular rhythm.  ?Pulmonary:  ?   Effort: Pulmonary effort is normal.  ?   Breath sounds: Normal breath sounds.  ?Abdominal:  ?   General: Abdomen is flat. Bowel sounds are normal.  ?   Palpations: Abdomen is soft.  ?   Tenderness: There is abdominal tenderness in the right lower quadrant, suprapubic area and left lower quadrant. There is no right CVA tenderness, left CVA tenderness, guarding or rebound.  ?   Comments: Abd tenderness to palpation is very mild.   ?Neurological:  ?   Mental Status: She is alert.  ? ? ? ?UC Treatments / Results  ?Labs ?(all labs ordered are listed, but only abnormal results are displayed) ?Labs Reviewed  ?POCT URINALYSIS DIPSTICK, ED / UC - Abnormal; Notable for the following components:  ?    Result Value  ? Ketones, ur TRACE (*)   ? All other components within normal limits  ?CERVICOVAGINAL ANCILLARY ONLY - Abnormal; Notable for the following components:  ? Bacterial Vaginitis (gardnerella) Positive (*)   ? Candida Vaginitis Positive (*)   ? All other components within normal limits  ?HIV ANTIBODY (ROUTINE TESTING W REFLEX)  ?RPR  ?POC URINE PREG, ED  ? ? ?EKG ? ? ?Radiology ?No results found. ? ?Procedures ?Procedures (including critical care time) ? ?Medications Ordered in UC ?Medications - No data to display ? ?Initial Impression / Assessment and Plan / UC Course  ?I have reviewed the triage vital signs and the nursing notes. ? ?Pertinent labs & imaging results that were available during my care of the patient were reviewed by me and considered in my medical decision making (see chart for  details). ? ?  ?Discussed safe sex practices. Cervical swab results pending. Discussed will need to notifiy partners if STI present. In conversation about her choices, pt shared she is bipolar and that she would like to get back into care for it. Pt can contact her prior mental health provider or try [facility].  ? ?Reviewed reasons for seeking immediate care for abd sx.  ? ?Final Clinical Impressions(s) / UC Diagnoses  ? ?Final diagnoses:  ?Vaginal discharge  ?Lower abdominal pain  ? ? ? ?Discharge Instructions   ? ?  ?[facility]) can help get you started with mental health care.  ? ?You will get a call if tests are positive, you will not get a call if tests are negative but you can check results in MyChart if you have a MyChart account.   ? ? ?ED Prescriptions   ?None ?  ? ?PDMP not reviewed this encounter. ?  ?Cathlyn Parsons, NP ?11/03/21 1314 ? ?

## 2021-11-25 ENCOUNTER — Other Ambulatory Visit: Payer: Self-pay

## 2021-11-25 ENCOUNTER — Encounter (HOSPITAL_COMMUNITY): Payer: Self-pay | Admitting: Emergency Medicine

## 2021-11-25 ENCOUNTER — Emergency Department (HOSPITAL_COMMUNITY)
Admission: EM | Admit: 2021-11-25 | Discharge: 2021-11-25 | Disposition: A | Discharge status: Home / Self Care | Payer: Medicaid Other | Attending: Emergency Medicine | Admitting: Emergency Medicine

## 2021-11-25 DIAGNOSIS — K5900 Constipation, unspecified: Secondary | ICD-10-CM | POA: Diagnosis not present

## 2021-11-25 DIAGNOSIS — J45909 Unspecified asthma, uncomplicated: Secondary | ICD-10-CM | POA: Insufficient documentation

## 2021-11-25 DIAGNOSIS — R109 Unspecified abdominal pain: Secondary | ICD-10-CM | POA: Diagnosis present

## 2021-11-25 DIAGNOSIS — R1084 Generalized abdominal pain: Secondary | ICD-10-CM

## 2021-11-25 DIAGNOSIS — Z7951 Long term (current) use of inhaled steroids: Secondary | ICD-10-CM | POA: Diagnosis not present

## 2021-11-25 LAB — COMPREHENSIVE METABOLIC PANEL
ALT: 14 U/L (ref 0–44)
AST: 15 U/L (ref 15–41)
Albumin: 3.9 g/dL (ref 3.5–5.0)
Alkaline Phosphatase: 72 U/L (ref 38–126)
Anion gap: 7 (ref 5–15)
BUN: 7 mg/dL (ref 6–20)
CO2: 24 mmol/L (ref 22–32)
Calcium: 9 mg/dL (ref 8.9–10.3)
Chloride: 107 mmol/L (ref 98–111)
Creatinine, Ser: 0.68 mg/dL (ref 0.44–1.00)
GFR, Estimated: >60 mL/min (ref >60)
Glucose, Bld: 98 mg/dL (ref 70–99)
Potassium: 3.8 mmol/L (ref 3.5–5.1)
Sodium: 138 mmol/L (ref 135–145)
Total Bilirubin: 1 mg/dL (ref 0.3–1.2)
Total Protein: 6.9 g/dL (ref 6.5–8.1)

## 2021-11-25 LAB — CBC
HCT: 43 % (ref 36.0–46.0)
Hemoglobin: 13.9 g/dL (ref 12.0–15.0)
MCH: 27.4 pg (ref 26.0–34.0)
MCHC: 32.3 g/dL (ref 30.0–36.0)
MCV: 84.8 fL (ref 80.0–100.0)
Platelets: 302 10*3/uL (ref 150–400)
RBC: 5.07 MIL/uL (ref 3.87–5.11)
RDW: 13.1 % (ref 11.5–15.5)
WBC: 8.1 10*3/uL (ref 4.0–10.5)
nRBC: 0 % (ref 0.0–0.2)

## 2021-11-25 LAB — URINALYSIS, ROUTINE W REFLEX MICROSCOPIC
Bilirubin Urine: NEGATIVE
Glucose, UA: NEGATIVE mg/dL
Hgb urine dipstick: NEGATIVE
Ketones, ur: NEGATIVE mg/dL
Nitrite: NEGATIVE
Protein, ur: NEGATIVE mg/dL
Specific Gravity, Urine: 1.025 (ref 1.005–1.030)
pH: 6 (ref 5.0–8.0)

## 2021-11-25 LAB — LIPASE, BLOOD: Lipase: 28 U/L (ref 11–51)

## 2021-11-25 LAB — I-STAT BETA HCG BLOOD, ED (MC, WL, AP ONLY): I-stat hCG, quantitative: <5 m[IU]/mL (ref <5)

## 2021-11-25 LAB — URINALYSIS, MICROSCOPIC (REFLEX): Squamous Epithelial / HPF: >50 (ref 0–5)

## 2021-11-25 NOTE — ED Triage Notes (Signed)
Pt states she fell asleep last night with a "waist trainer" on.  Reports generalized abd pain since taking it off this morning.  States pain may be from constipation.  Last BM today but states it wasn't a "full" BM.  Mild nausea.

## 2021-11-25 NOTE — ED Provider Notes (Signed)
[facility] EMERGENCY DEPARTMENT Provider Note   CSN: [id number] Arrival date & time: 11/25/21  1300     History  Chief Complaint  Patient presents with   Abdominal Pain    Rose Schwartz is a 21 y.o. female.  Patient is a 21 year old female with a history of asthma, anemia and bipolar disease who is presenting today with complaints of abdominal pain.  Patient reports that she slept last night with a waist belt on that helps firm your abdomen which she did not mean to but she took it off when she woke up this morning and started noticing diffuse abdominal pain.  She ran to the bathroom due to the significant pain but was not able to have a bowel movement.  She finally was able to go back to sleep for a while but woke up again and the pain was not improved even after a shower and trying to relax.  It seems to be worse with movement or walking.  She denies any urinary or vaginal symptoms.  She does not have a history of recurrent abdominal pain.  She did take an ibuprofen at 730 this morning but does not feel that it helped.  She had felt nauseated today with the pain but denies any vomiting.  She has an IUD and cannot remember when her last menses was.   Abdominal Pain     Home Medications Prior to Admission medications   Medication Sig Start Date End Date Taking? Authorizing Provider  albuterol (VENTOLIN HFA) 108 (90 Base) MCG/ACT inhaler Inhale 2 puffs into the lungs every 4 (four) hours as needed for wheezing or shortness of breath (cough, shortness of breath or wheezing.). 12/18/18   Elvina Sidle, MD  fluconazole (DIFLUCAN) 150 MG tablet Take 1 tablet (150 mg total) by mouth daily. 11/02/21   Merrilee Jansky, MD  ibuprofen (ADVIL) 600 MG tablet Take 1 tablet (600 mg total) by mouth every 6 (six) hours. 10/09/20   Brimage, Seward Meth, DO  metroNIDAZOLE (FLAGYL) 500 MG tablet Take 1 tablet (500 mg total) by mouth 2 (two) times daily. 11/02/21   Lamptey, Britta Mccreedy,  MD  ondansetron (ZOFRAN-ODT) 4 MG disintegrating tablet Take 1 tablet (4 mg total) by mouth every 8 (eight) hours as needed for nausea or vomiting. 08/16/21   Lamptey, Britta Mccreedy, MD      Allergies    Other    Review of Systems   Review of Systems  Gastrointestinal:  Positive for abdominal pain.   Physical Exam Updated Vital Signs BP (!) 119/92 (BP Location: Right Arm)   Pulse 93   Temp 98.7 F (37.1 C) (Oral)   Resp 18   SpO2 100%  Physical Exam Vitals and nursing note reviewed.  Constitutional:      General: She is not in acute distress.    Appearance: She is well-developed.  HENT:     Head: Normocephalic and atraumatic.  Eyes:     Pupils: Pupils are equal, round, and reactive to light.  Cardiovascular:     Rate and Rhythm: Normal rate and regular rhythm.     Heart sounds: Normal heart sounds. No murmur heard.   No friction rub.  Pulmonary:     Effort: Pulmonary effort is normal.     Breath sounds: Normal breath sounds. No wheezing or rales.  Abdominal:     General: Bowel sounds are normal. There is no distension.     Palpations: Abdomen is soft.  Tenderness: There is no abdominal tenderness. There is no guarding or rebound.     Comments: Minimal discomfort in the left lower quadrant without any pain or guarding  Musculoskeletal:        General: No tenderness. Normal range of motion.     Comments: No edema  Skin:    General: Skin is warm and dry.     Findings: No rash.  Neurological:     Mental Status: She is alert and oriented to person, place, and time.     Cranial Nerves: No cranial nerve deficit.  Psychiatric:        Behavior: Behavior normal.    ED Results / Procedures / Treatments   Labs (all labs ordered are listed, but only abnormal results are displayed) Labs Reviewed  URINALYSIS, ROUTINE W REFLEX MICROSCOPIC - Abnormal; Notable for the following components:      Result Value   APPearance CLOUDY (*)    Leukocytes,Ua SMALL (*)    All other  components within normal limits  URINALYSIS, MICROSCOPIC (REFLEX) - Abnormal; Notable for the following components:   Bacteria, UA RARE (*)    All other components within normal limits  LIPASE, BLOOD  COMPREHENSIVE METABOLIC PANEL  CBC  I-STAT BETA HCG BLOOD, ED ([facility], [facility], [facility] ONLY)    EKG None  Radiology No results found.  Procedures Procedures    Medications Ordered in ED Medications - No data to display  ED Course/ Medical Decision Making/ A&P                           Medical Decision Making Amount and/or Complexity of Data Reviewed Labs: ordered. Decision-making details documented in ED Course.   Patient is a 21 year old female presenting with diffuse lower abdominal pain upon arrival patient reports prior to my evaluation she has now had 2 large bowel movements and reports the pain is increased significantly improved.  Now she just has minimal pain in the left lower quadrant.  Patient does not describe any symptoms concerning for ovarian torsion today, urinary pathology.  Low suspicion for perforation based on patient's exam.  Her abdomen is soft and is minimally tender.  Suspect patient's pain was coming from constipation and bowel cramping.  I independently interpreted patient's labs and her hCG is negative, CBC within normal limits, CMP and lipase are within normal limits.  On repeat evaluation patient is still not having any further abdominal pain.  No indication for CT at this time.  Most likely was related to constipation which is now resolved.  Discussed dietary changes to prevent constipation in the future.        Final Clinical Impression(s) / ED Diagnoses Final diagnoses:  Generalized abdominal pain  Constipation, unspecified constipation type    Rx / DC Orders ED Discharge Orders     None         Gwyneth Sprout, MD 11/25/21 1454

## 2021-11-25 NOTE — Discharge Instructions (Signed)
All the blood work looks normal today.  The pain was most likely related to constipation.  If you have recurrent pain that is severe you start having nausea vomiting, fever or other concerns return to the emergency room

## 2021-11-26 ENCOUNTER — Telehealth: Payer: Self-pay

## 2021-11-26 NOTE — Telephone Encounter (Signed)
Transition Care Management Follow-up Telephone Call Date of discharge and from where: 11/25/2021 from [facility] How have you been since you were released from the hospital? Patient stated that she is feeling better and did not have any questions or concerns at this time.  Any questions or concerns? No  Items Reviewed: Did the pt receive and understand the discharge instructions provided? Yes  Medications obtained and verified? Yes  Other? Yes  Any new allergies since your discharge? No  Dietary orders reviewed? No Do you have support at home? Yes   Functional Questionnaire: (I = Independent and D = Dependent) ADLs: I  Bathing/Dressing- I  Meal Prep- I  Eating- I  Maintaining continence- I  Transferring/Ambulation- I  Managing Meds- I   Follow up appointments reviewed:  PCP Hospital f/u appt confirmed? No   Specialist Hospital f/u appt confirmed? No   Are transportation arrangements needed? No  If their condition worsens, is the pt aware to call PCP or go to the Emergency Dept.? Yes Was the patient provided with contact information for the PCP's office or ED? Yes Was to pt encouraged to call back with questions or concerns? Yes

## 2021-12-11 ENCOUNTER — Encounter: Payer: Self-pay | Admitting: *Deleted

## 2021-12-13 DIAGNOSIS — F411 Generalized anxiety disorder: Secondary | ICD-10-CM | POA: Diagnosis not present

## 2021-12-13 DIAGNOSIS — F4310 Post-traumatic stress disorder, unspecified: Secondary | ICD-10-CM | POA: Diagnosis not present

## 2021-12-13 DIAGNOSIS — F902 Attention-deficit hyperactivity disorder, combined type: Secondary | ICD-10-CM | POA: Diagnosis not present

## 2021-12-13 DIAGNOSIS — F315 Bipolar disorder, current episode depressed, severe, with psychotic features: Secondary | ICD-10-CM | POA: Diagnosis not present

## 2022-01-11 DIAGNOSIS — F411 Generalized anxiety disorder: Secondary | ICD-10-CM | POA: Diagnosis not present

## 2022-01-19 IMAGING — US US PELVIS COMPLETE WITH TRANSVAGINAL
1 series · 15 of 25 positions shown · non-contrast
Comparison: None

CLINICAL DATA: Pelvic pain



[Series 1: us pelvis complete with transvaginal · 90 acquisitions, 15 frames shown]
[im 1/90]
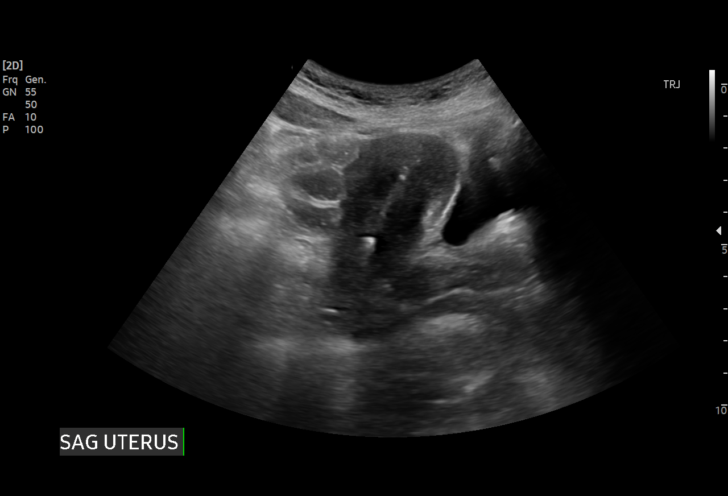
[im 8/90]
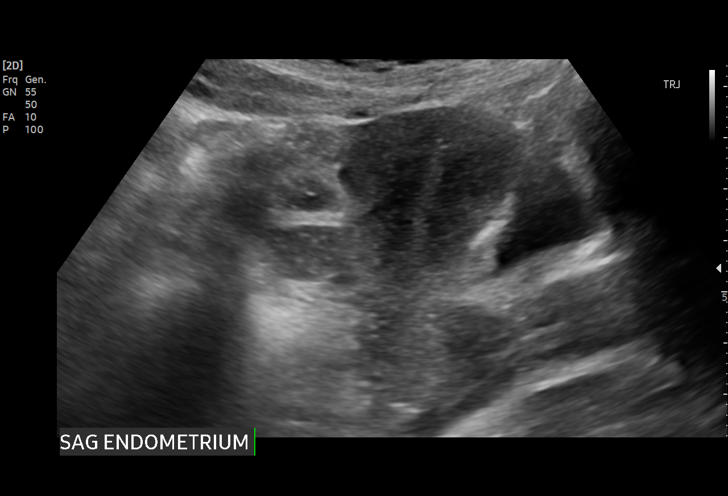
[im 15/90]
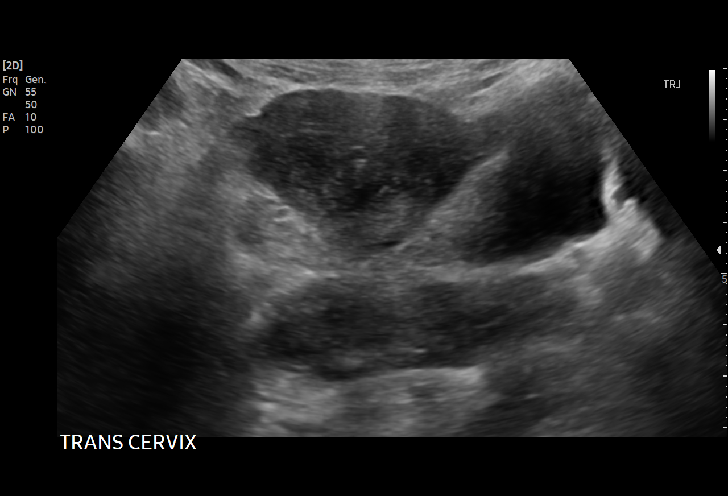
[im 19/90]
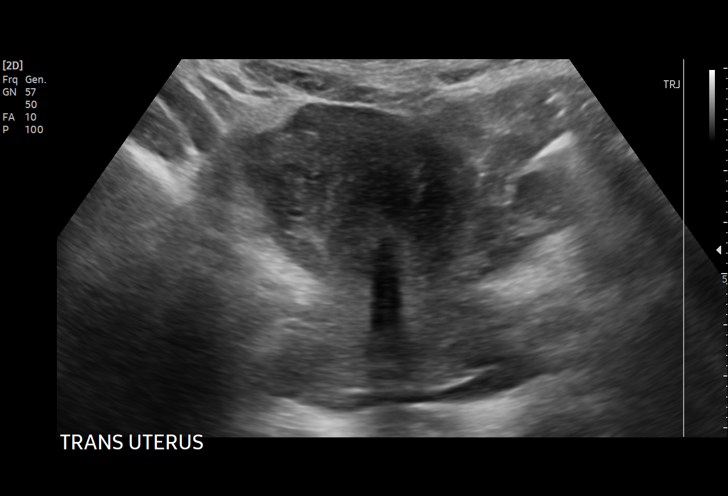
[im 26/90]
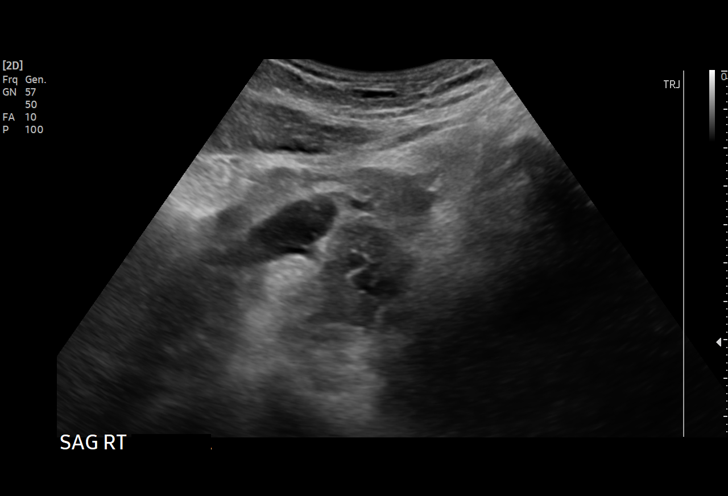
[im 34/90]
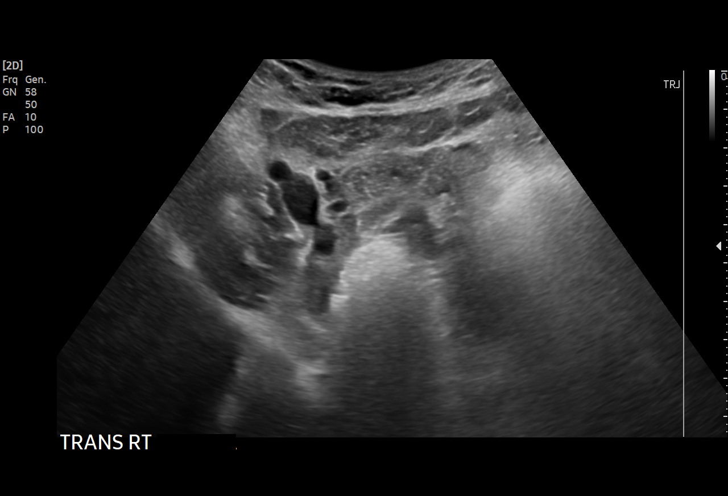
[im 38/90]
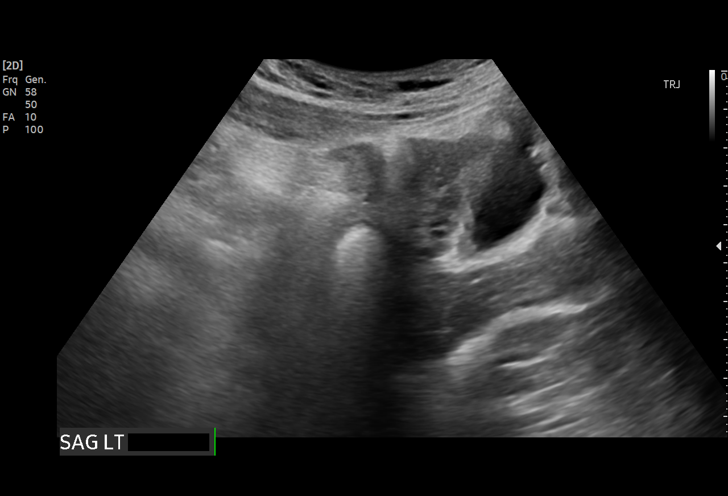
[im 45/90]
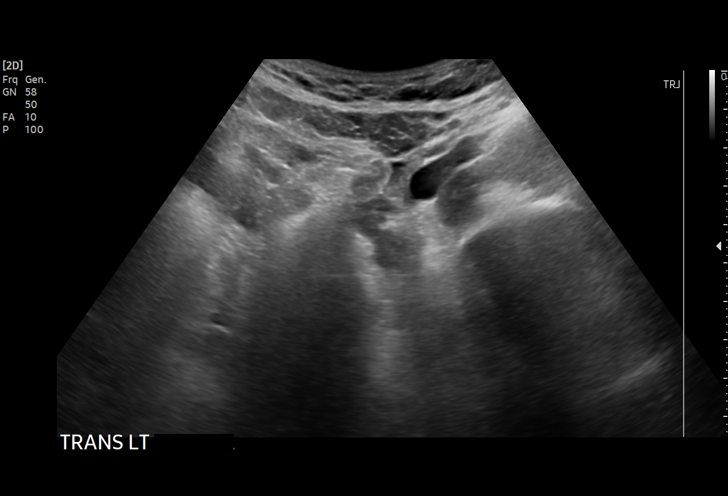
[im 52/90]
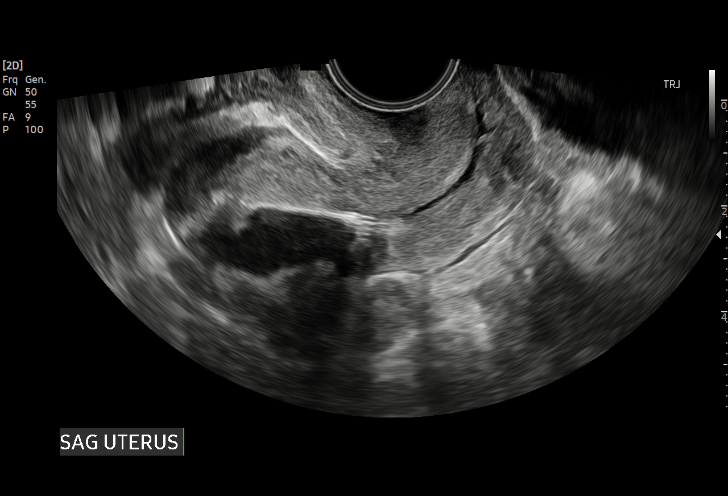
[im 56/90]
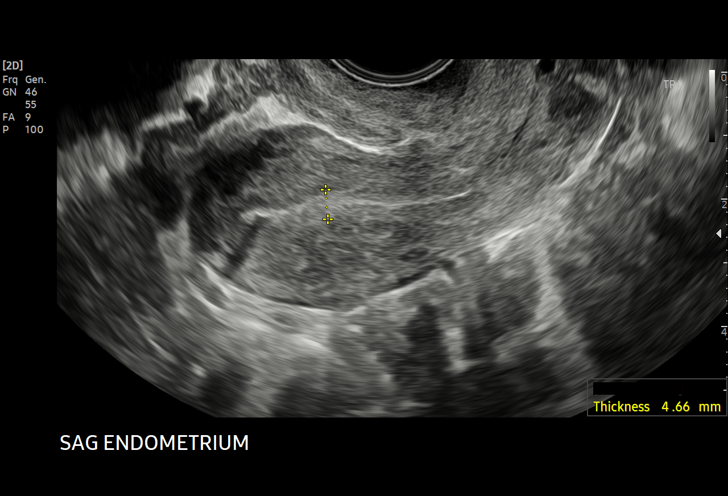
[im 64/90]
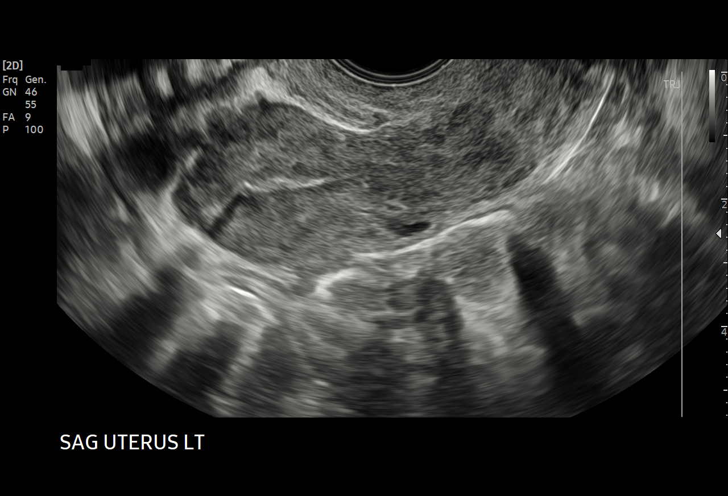
[im 71/90]
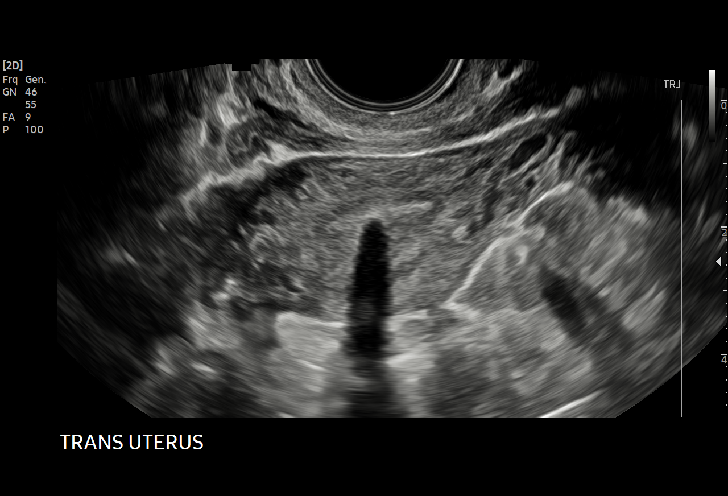
[im 75/90]
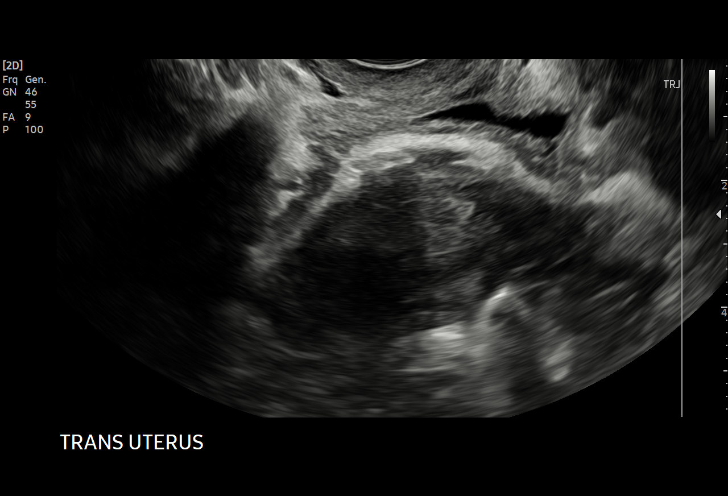
[im 82/90]
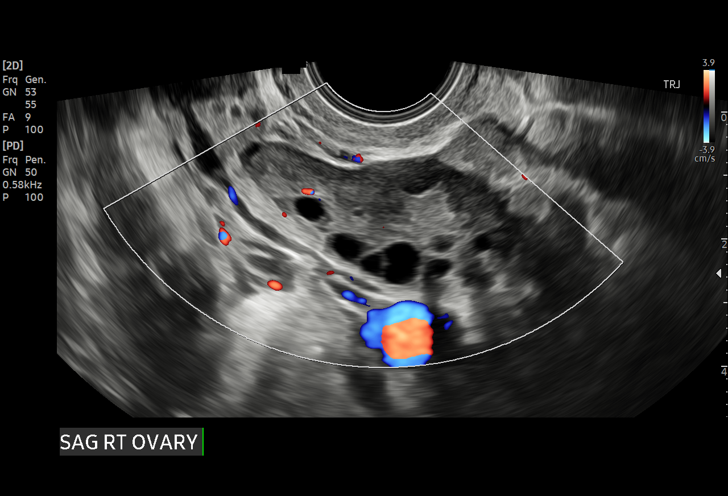
[im 90/90]
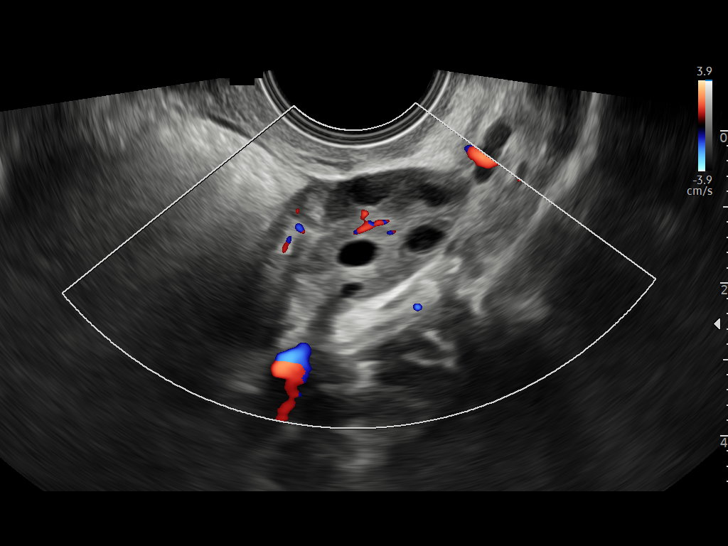

[15 of 25 positions shown; findings below may reference images not displayed]

FINDINGS: Uterus

Measurements: 6.7 x 3.6 x 4.6 cm = volume: 59 mL. No fibroids or
other mass visualized.

Endometrium

Thickness: 5 mm in thickness.  IUD in place in expected position.

Right ovary

Measurements: 4.7 x 2.3 x 2.4 cm = volume: 13.7 mL. Normal
appearance/no adnexal mass.

Left ovary

Measurements: 4.1 x 1.8 x 2.4 cm = volume: 9.3 mL. Normal
appearance/no adnexal mass.

Other findings

No abnormal free fluid.
IMPRESSION: IUD in expected position.

No acute findings.

## 2022-02-01 DIAGNOSIS — F411 Generalized anxiety disorder: Secondary | ICD-10-CM | POA: Diagnosis not present

## 2022-02-06 DIAGNOSIS — F411 Generalized anxiety disorder: Secondary | ICD-10-CM | POA: Diagnosis not present

## 2022-02-06 DIAGNOSIS — F4310 Post-traumatic stress disorder, unspecified: Secondary | ICD-10-CM | POA: Diagnosis not present

## 2022-02-06 DIAGNOSIS — F902 Attention-deficit hyperactivity disorder, combined type: Secondary | ICD-10-CM | POA: Diagnosis not present

## 2022-02-06 DIAGNOSIS — F315 Bipolar disorder, current episode depressed, severe, with psychotic features: Secondary | ICD-10-CM | POA: Diagnosis not present

## 2022-03-13 ENCOUNTER — Ambulatory Visit (HOSPITAL_COMMUNITY)
Admission: EM | Admit: 2022-03-13 | Discharge: 2022-03-13 | Disposition: A | Discharge status: Home / Self Care | Payer: Medicaid Other | Attending: Internal Medicine | Admitting: Internal Medicine

## 2022-03-13 ENCOUNTER — Encounter (HOSPITAL_COMMUNITY): Payer: Self-pay | Admitting: Emergency Medicine

## 2022-03-13 DIAGNOSIS — H8190 Unspecified disorder of vestibular function, unspecified ear: Secondary | ICD-10-CM

## 2022-03-13 DIAGNOSIS — R110 Nausea: Secondary | ICD-10-CM | POA: Diagnosis not present

## 2022-03-13 DIAGNOSIS — H6593 Unspecified nonsuppurative otitis media, bilateral: Secondary | ICD-10-CM | POA: Diagnosis not present

## 2022-03-13 DIAGNOSIS — H8193 Unspecified disorder of vestibular function, bilateral: Secondary | ICD-10-CM

## 2022-03-13 LAB — POCT URINALYSIS DIPSTICK, ED / UC
Bilirubin Urine: NEGATIVE
Glucose, UA: NEGATIVE mg/dL
Hgb urine dipstick: NEGATIVE
Ketones, ur: NEGATIVE mg/dL
Leukocytes,Ua: NEGATIVE
Nitrite: NEGATIVE
Protein, ur: NEGATIVE mg/dL
Specific Gravity, Urine: 1.015 (ref 1.005–1.030)
Urobilinogen, UA: 0.2 mg/dL (ref 0.0–1.0)
pH: 7.5 (ref 5.0–8.0)

## 2022-03-13 MED ORDER — LORATADINE 10 MG PO TABS: 10.0000 mg | ORAL_TABLET | Freq: Every day | ORAL | 0 refills | Status: AC

## 2022-03-13 MED ORDER — GUAIFENESIN ER 600 MG PO TB12: 600.0000 mg | ORAL_TABLET | Freq: Two times a day (BID) | ORAL | 0 refills | Status: AC

## 2022-03-13 NOTE — ED Triage Notes (Signed)
Pt reports feeling lightheaded since this morning. States this happens frequently and going to sleep helps. Also endorses a sore throat for a few weeks and states this morning she noticed it was worse and nausea beginning this morning.

## 2022-03-13 NOTE — ED Provider Notes (Signed)
[facility]    CSN: [id number] Arrival date & time: 03/13/22  1409      History   Chief Complaint Chief Complaint  Patient presents with   Dizziness   Nausea    HPI EMONEE WINKOWSKI is a 21 y.o. female comes to the urgent care with fairly sudden onset of bilateral ear fullness, spinning sensation and a sensation that the room was spinning around her.  This happened today while she was at work.  She denies any falls or trauma to the head.  No ringing in the ears.  No discharge from the ears.  No fever or chills.  No nausea or vomiting.  No fever or chills.  No sore throat. No sick contacts.  HPI  Past Medical History:  Diagnosis Date   Anemia    Asthma    Bipolar 1 disorder (HCC)    Frequent urination 10/11/2020   UTI (urinary tract infection)     Patient Active Problem List   Diagnosis Date Noted   Chronic headaches 12/18/2020   Chest pain 12/18/2020   Eczema 10/11/2020   Encounter to establish care with new doctor 06/23/2020   Alpha thalassemia silent carrier 08/04/2019   Asthma    Bipolar 1 disorder [facility])     Past Surgical History:  Procedure Laterality Date   CYST EXCISION Left 03/21/2020   Procedure: Left wrist ganglion cyst excision;  Surgeon: [name], MD;  Location: [facility];  Service: Plastics;  Laterality: Left;  total case time is 1 hour   FETAL AMNIOTIC BAND RELEASE     TRIGGER FINGER RELEASE Left 03/21/2020   Procedure: first dorsal extensor compartment release;  Surgeon: [name], MD;  Location: [facility];  Service: Plastics;  Laterality: Left;    OB History     Gravida  1   Para  1   Term  1   Preterm      AB      Living  1      SAB      IAB      Ectopic      Multiple  0   Live Births  1            Home Medications    Prior to Admission medications   Medication Sig Start Date End Date Taking? Authorizing Provider  guaiFENesin (MUCINEX) 600 MG 12 hr tablet  Take 1 tablet (600 mg total) by mouth 2 (two) times daily for 10 days. 03/13/22 03/23/22 Yes Cricket Goodlin, Britta Mccreedy, MD  loratadine (CLARITIN) 10 MG tablet Take 1 tablet (10 mg total) by mouth daily. 03/13/22  Yes Jasper Ruminski, Britta Mccreedy, MD  albuterol (VENTOLIN HFA) 108 (90 Base) MCG/ACT inhaler Inhale 2 puffs into the lungs every 4 (four) hours as needed for wheezing or shortness of breath (cough, shortness of breath or wheezing.). 12/18/18   Elvina Sidle, MD  fluconazole (DIFLUCAN) 150 MG tablet Take 1 tablet (150 mg total) by mouth daily. 11/02/21   Merrilee Jansky, MD  ibuprofen (ADVIL) 600 MG tablet Take 1 tablet (600 mg total) by mouth every 6 (six) hours. 10/09/20   Brimage, Seward Meth, DO  metroNIDAZOLE (FLAGYL) 500 MG tablet Take 1 tablet (500 mg total) by mouth 2 (two) times daily. 11/02/21   Zianne Schubring, Britta Mccreedy, MD  ondansetron (ZOFRAN-ODT) 4 MG disintegrating tablet Take 1 tablet (4 mg total) by mouth every 8 (eight) hours as needed for nausea or vomiting. 08/16/21  Tristin Vandeusen, Britta Mccreedy, MD    Family History Family History  Problem Relation Age of Onset   Fibromyalgia Mother     Social History Social History   Tobacco Use   Smoking status: Never   Smokeless tobacco: Never  Vaping Use   Vaping Use: Never used  Substance Use Topics   Alcohol use: Yes    Comment: SOCIAL   Drug use: Yes    Types: Marijuana     Allergies   Other   Review of Systems Review of Systems  HENT: Negative.  Negative for congestion, ear discharge and ear pain.   Eyes: Negative.   Respiratory: Negative.    Cardiovascular: Negative.   Gastrointestinal:  Positive for nausea.  Genitourinary: Negative.   Neurological:  Positive for dizziness and headaches. Negative for syncope, speech difficulty and weakness.     Physical Exam Triage Vital Signs ED Triage Vitals  Enc Vitals Group     BP 03/13/22 1534 106/71     Pulse Rate 03/13/22 1534 68     Resp 03/13/22 1534 18     Temp 03/13/22 1534 97.9 F (36.6 C)      Temp Source 03/13/22 1534 Oral     SpO2 03/13/22 1534 100 %     Weight --      Height --      Head Circumference --      Peak Flow --      Pain Score 03/13/22 1533 0     Pain Loc --      Pain Edu? --      Excl. in GC? --    No data found.  Updated Vital Signs BP 106/71 (BP Location: Left Arm)   Pulse 68   Temp 97.9 F (36.6 C) (Oral)   Resp 18   SpO2 100%   Visual Acuity Right Eye Distance:   Left Eye Distance:   Bilateral Distance:    Right Eye Near:   Left Eye Near:    Bilateral Near:     Physical Exam Vitals and nursing note reviewed.  Constitutional:      Appearance: She is not ill-appearing or diaphoretic.  HENT:     Right Ear: Tympanic membrane normal.     Left Ear: Tympanic membrane normal.  Cardiovascular:     Rate and Rhythm: Normal rate and regular rhythm.     Pulses: Normal pulses.     Heart sounds: Normal heart sounds.  Pulmonary:     Effort: Pulmonary effort is normal.     Breath sounds: Normal breath sounds.  Abdominal:     General: Bowel sounds are normal.     Palpations: Abdomen is soft.  Musculoskeletal:     Cervical back: Normal range of motion.  Neurological:     Mental Status: She is alert.      UC Treatments / Results  Labs (all labs ordered are listed, but only abnormal results are displayed) Labs Reviewed  POCT URINALYSIS DIPSTICK, ED / UC    EKG   Radiology No results found.  Procedures Procedures (including critical care time)  Medications Ordered in UC Medications - No data to display  Initial Impression / Assessment and Plan / UC Course  I have reviewed the triage vital signs and the nursing notes.  Pertinent labs & imaging results that were available during my care of the patient were reviewed by me and considered in my medical decision making (see chart for details).     1.  Vestibular  dizziness: Claritin daily Mucinex twice daily Humidifier use and vapor rub use will help with nasal congestion and middle  ear effusion No indication for COVID testing Return to urgent care if symptoms worsen. Final Clinical Impressions(s) / UC Diagnoses   Final diagnoses:  Middle ear effusion, bilateral  Vestibular dizziness     Discharge Instructions      Please take medications as prescribed Humidifier use and vapor rub use will help with nasal congestion and a sensation of fullness in both ears Your ear exam is remarkable for fluid in the ears. If you have worsening symptoms please return to urgent care to be reevaluated.    ED Prescriptions     Medication Sig Dispense Auth. Provider   loratadine (CLARITIN) 10 MG tablet Take 1 tablet (10 mg total) by mouth daily. 30 tablet Marieann Zipp, Britta Mccreedy, MD   guaiFENesin (MUCINEX) 600 MG 12 hr tablet Take 1 tablet (600 mg total) by mouth 2 (two) times daily for 10 days. 20 tablet Alexyia Guarino, Britta Mccreedy, MD      PDMP not reviewed this encounter.   Merrilee Jansky, MD 03/13/22 1736

## 2022-03-13 NOTE — Discharge Instructions (Addendum)
Please take medications as prescribed Humidifier use and vapor rub use will help with nasal congestion and a sensation of fullness in both ears Your ear exam is remarkable for fluid in the ears. If you have worsening symptoms please return to urgent care to be reevaluated.

## 2022-06-02 ENCOUNTER — Encounter (HOSPITAL_COMMUNITY): Payer: Self-pay | Admitting: Emergency Medicine

## 2022-06-02 ENCOUNTER — Ambulatory Visit (HOSPITAL_COMMUNITY)
Admission: EM | Admit: 2022-06-02 | Discharge: 2022-06-02 | Disposition: A | Discharge status: Home / Self Care | Payer: Medicaid Other | Attending: Internal Medicine | Admitting: Internal Medicine

## 2022-06-02 DIAGNOSIS — N3090 Cystitis, unspecified without hematuria: Secondary | ICD-10-CM | POA: Diagnosis not present

## 2022-06-02 LAB — POCT URINALYSIS DIPSTICK, ED / UC
Bilirubin Urine: NEGATIVE
Glucose, UA: NEGATIVE mg/dL
Hgb urine dipstick: NEGATIVE
Leukocytes,Ua: NEGATIVE
Nitrite: NEGATIVE
Protein, ur: NEGATIVE mg/dL
Specific Gravity, Urine: 1.025 (ref 1.005–1.030)
Urobilinogen, UA: 0.2 mg/dL (ref 0.0–1.0)
pH: 6 (ref 5.0–8.0)

## 2022-06-02 NOTE — ED Triage Notes (Addendum)
Pt reports lower abdominal pain and nausea x 2 days. Denies vomiting. States wanted to make sure symptoms wasn't related to food poisoning.

## 2022-06-02 NOTE — Discharge Instructions (Signed)
You were seen today for pelvic pressure, nausea and urine odor.  Your urine did not show any evidence of urinary tract infection.  I encourage that you push fluids and avoid any caffeine OTC at this time.  Please follow-up with your PCP if symptoms persist or worsen.

## 2022-06-02 NOTE — ED Provider Notes (Signed)
[facility]    CSN: [id number] Arrival date & time: 06/02/22  1745      History   Chief Complaint Chief Complaint  Patient presents with   Abdominal Pain    HPI Rose Schwartz is a 21 y.o. female with a history of bipolar, asthma and anemia presents to UC today with complaint of lower abdominal pressure, urine odor and nausea.  She reports that started 2 days ago.  She denies urinary urgency, frequency, dysuria or blood in her urine.  She denies vaginal discharge, irritation, itching or abnormal vaginal bleeding.  She denies fever, chills or low back pain.  She is sexually active but not concerned about STDs at this time.  She has not tried anything OTC for this.  HPI  Past Medical History:  Diagnosis Date   Anemia    Asthma    Bipolar 1 disorder (HCC)    Frequent urination 10/11/2020   UTI (urinary tract infection)     Patient Active Problem List   Diagnosis Date Noted   Chronic headaches 12/18/2020   Chest pain 12/18/2020   Eczema 10/11/2020   Encounter to establish care with new doctor 06/23/2020   Alpha thalassemia silent carrier 08/04/2019   Asthma    Bipolar 1 disorder [facility])     Past Surgical History:  Procedure Laterality Date   CYST EXCISION Left 03/21/2020   Procedure: Left wrist ganglion cyst excision;  Surgeon: [name], MD;  Location: [facility];  Service: Plastics;  Laterality: Left;  total case time is 1 hour   FETAL AMNIOTIC BAND RELEASE     TRIGGER FINGER RELEASE Left 03/21/2020   Procedure: first dorsal extensor compartment release;  Surgeon: [name], MD;  Location: [facility];  Service: Plastics;  Laterality: Left;    OB History     Gravida  1   Para  1   Term  1   Preterm      AB      Living  1      SAB      IAB      Ectopic      Multiple  0   Live Births  1            Home Medications    Prior to Admission medications   Medication Sig Start Date End  Date Taking? Authorizing Provider  albuterol (VENTOLIN HFA) 108 (90 Base) MCG/ACT inhaler Inhale 2 puffs into the lungs every 4 (four) hours as needed for wheezing or shortness of breath (cough, shortness of breath or wheezing.). 12/18/18   Elvina Sidle, MD  fluconazole (DIFLUCAN) 150 MG tablet Take 1 tablet (150 mg total) by mouth daily. 11/02/21   Merrilee Jansky, MD  ibuprofen (ADVIL) 600 MG tablet Take 1 tablet (600 mg total) by mouth every 6 (six) hours. 10/09/20   Katha Cabal, DO  loratadine (CLARITIN) 10 MG tablet Take 1 tablet (10 mg total) by mouth daily. 03/13/22   Lamptey, Britta Mccreedy, MD  metroNIDAZOLE (FLAGYL) 500 MG tablet Take 1 tablet (500 mg total) by mouth 2 (two) times daily. 11/02/21   Lamptey, Britta Mccreedy, MD  ondansetron (ZOFRAN-ODT) 4 MG disintegrating tablet Take 1 tablet (4 mg total) by mouth every 8 (eight) hours as needed for nausea or vomiting. 08/16/21   Lamptey, Britta Mccreedy, MD    Family History Family History  Problem Relation Age of Onset   Fibromyalgia Mother  Social History Social History   Tobacco Use   Smoking status: Never   Smokeless tobacco: Never  Vaping Use   Vaping Use: Never used  Substance Use Topics   Alcohol use: Yes    Comment: SOCIAL   Drug use: Yes    Types: Marijuana     Allergies   Other   Review of Systems Review of Systems  Constitutional:  Negative for chills and fever.  Respiratory:  Negative for cough, chest tightness and shortness of breath.   Cardiovascular:  Negative for chest pain.  Gastrointestinal:  Positive for abdominal pain and nausea. Negative for diarrhea and vomiting.  Genitourinary:  Negative for difficulty urinating, dysuria, flank pain, frequency, hematuria, urgency, vaginal bleeding and vaginal discharge.     Physical Exam Triage Vital Signs ED Triage Vitals  Enc Vitals Group     BP 06/02/22 1819 117/79     Pulse Rate 06/02/22 1819 88     Resp 06/02/22 1819 17     Temp 06/02/22 1819 98 F (36.7 C)      Temp Source 06/02/22 1819 Oral     SpO2 06/02/22 1819 97 %     Weight --      Height --      Head Circumference --      Peak Flow --      Pain Score 06/02/22 1817 4     Pain Loc --      Pain Edu? --      Excl. in GC? --    No data found.  Updated Vital Signs BP 117/79 (BP Location: Right Arm)   Pulse 88   Temp 98 F (36.7 C) (Oral)   Resp 17   LMP 05/28/2022 (Approximate)   SpO2 97%       Physical Exam Constitutional:      Appearance: She is well-developed.  Cardiovascular:     Rate and Rhythm: Normal rate and regular rhythm.  Pulmonary:     Effort: Pulmonary effort is normal.     Breath sounds: Normal breath sounds.  Abdominal:     General: Abdomen is flat. There is no distension.     Palpations: Abdomen is soft.     Tenderness: There is no abdominal tenderness. There is no right CVA tenderness or left CVA tenderness.  Neurological:     Mental Status: She is alert.      UC Treatments / Results  Labs Labs Reviewed  POCT URINALYSIS DIPSTICK, ED / UC - Abnormal; Notable for the following components:      Result Value   Ketones, ur TRACE (*)    All other components within normal limits     Medications Ordered in UC Medications - No data to display  Initial Impression / Assessment and Plan / UC Course  I have reviewed the triage vital signs and the nursing notes.  Pertinent labs & imaging results that were available during my care of the patient were reviewed by me and considered in my medical decision making (see chart for details).     21 year old female with complaint of pelvic pressure and urine odor x1 day.  DDx include UTI, interstitial cystitis, vaginitis.  Urinalysis shows trace ketones but otherwise unremarkable.  She reports her symptoms have actually improved since she urinated in the bathroom.  Will not obtain wet prep as she denies vaginal symptoms.  Advised her to push fluids.  She will follow-up with her PCP if symptoms persist or  worsen.  Final Clinical  Impressions(s) / UC Diagnoses   Final diagnoses:  Cystitis     Discharge Instructions      You were seen today for pelvic pressure, nausea and urine odor.  Your urine did not show any evidence of urinary tract infection.  I encourage that you push fluids and avoid any caffeine OTC at this time.  Please follow-up with your PCP if symptoms persist or worsen.     ED Prescriptions   None    PDMP not reviewed this encounter.   Lorre Munroe, NP 06/02/22 [phone]

## 2023-04-04 ENCOUNTER — Encounter (HOSPITAL_COMMUNITY): Payer: Self-pay

## 2023-04-04 ENCOUNTER — Ambulatory Visit (HOSPITAL_COMMUNITY)
Admission: EM | Admit: 2023-04-04 | Discharge: 2023-04-04 | Disposition: A | Discharge status: Home / Self Care | Payer: MEDICAID | Attending: Internal Medicine | Admitting: Internal Medicine

## 2023-04-04 DIAGNOSIS — N3001 Acute cystitis with hematuria: Secondary | ICD-10-CM | POA: Insufficient documentation

## 2023-04-04 LAB — POCT URINALYSIS DIP (MANUAL ENTRY)
Bilirubin, UA: NEGATIVE
Glucose, UA: NEGATIVE mg/dL
Nitrite, UA: NEGATIVE
Protein Ur, POC: 100 mg/dL — AB
Spec Grav, UA: 1.025 (ref 1.010–1.025)
Urobilinogen, UA: 0.2 U/dL
pH, UA: 8.5 — AB (ref 5.0–8.0)

## 2023-04-04 MED ORDER — CEPHALEXIN 500 MG PO CAPS: 500.0000 mg | ORAL_CAPSULE | Freq: Three times a day (TID) | ORAL | 0 refills | Status: DC

## 2023-04-04 MED ORDER — CEPHALEXIN 500 MG PO CAPS
500.0000 mg | ORAL_CAPSULE | Freq: Two times a day (BID) | ORAL | 0 refills | Status: AC
Start: 2023-04-04 — End: 2023-04-11

## 2023-04-04 NOTE — ED Triage Notes (Signed)
Pt states that she has urinary frequency and pain with urination. X1 day

## 2023-04-04 NOTE — Discharge Instructions (Signed)

## 2023-04-04 NOTE — ED Provider Notes (Signed)
[facility]    CSN: [id number] Arrival date & time: 04/04/23  1358      History   Chief Complaint Chief Complaint  Patient presents with   Urinary Frequency    Urinary frequency and pain with urination x1 day    HPI Rose Schwartz is a 22 y.o. female.   Rose Schwartz is a 22 y.o. female presenting for chief complaint of urinary frequency, dysuria, and urinary urgency that started this morning. Reports some pain to the left lower pelvic region. No N/V/D, low back pain, fevers, chills, flank pain, vaginal symptoms.  States this feels like the last time she had a urinary tract infection.  No history of diabetes or use of SGLT2 inhibitor.  No recent antibiotic/steroid use.  Denies frequent intake of urinary irritants.  She has not attempted use of any over-the-counter medications PTA.    Urinary Frequency    Past Medical History:  Diagnosis Date   Anemia    Asthma    Bipolar 1 disorder (HCC)    Frequent urination 10/11/2020   UTI (urinary tract infection)     Patient Active Problem List   Diagnosis Date Noted   Chronic headaches 12/18/2020   Chest pain 12/18/2020   Eczema 10/11/2020   Encounter to establish care with new doctor 06/23/2020   Alpha thalassemia silent carrier 08/04/2019   Asthma    Bipolar 1 disorder [facility])     Past Surgical History:  Procedure Laterality Date   CYST EXCISION Left 03/21/2020   Procedure: Left wrist ganglion cyst excision;  Surgeon: [name], MD;  Location: [facility];  Service: Plastics;  Laterality: Left;  total case time is 1 hour   FETAL AMNIOTIC BAND RELEASE     TRIGGER FINGER RELEASE Left 03/21/2020   Procedure: first dorsal extensor compartment release;  Surgeon: [name], MD;  Location: [facility];  Service: Plastics;  Laterality: Left;    OB History     Gravida  1   Para  1   Term  1   Preterm      AB      Living  1      SAB      IAB       Ectopic      Multiple  0   Live Births  1            Home Medications    Prior to Admission medications   Medication Sig Start Date End Date Taking? Authorizing Provider  ELDERBERRY PO Take by mouth.   Yes [provider]  albuterol (VENTOLIN HFA) 108 (90 Base) MCG/ACT inhaler Inhale 2 puffs into the lungs every 4 (four) hours as needed for wheezing or shortness of breath (cough, shortness of breath or wheezing.). 12/18/18   Elvina Sidle, MD  cephALEXin (KEFLEX) 500 MG capsule Take 1 capsule (500 mg total) by mouth 2 (two) times daily for 7 days. 04/04/23 04/11/23  Carlisle Beers, FNP  fluconazole (DIFLUCAN) 150 MG tablet Take 1 tablet (150 mg total) by mouth daily. 11/02/21   Merrilee Jansky, MD  ibuprofen (ADVIL) 600 MG tablet Take 1 tablet (600 mg total) by mouth every 6 (six) hours. 10/09/20   Katha Cabal, DO  loratadine (CLARITIN) 10 MG tablet Take 1 tablet (10 mg total) by mouth daily. 03/13/22   Lamptey, Britta Mccreedy, MD  metroNIDAZOLE (FLAGYL) 500 MG tablet Take 1 tablet (500 mg total) by  mouth 2 (two) times daily. 11/02/21   Lamptey, Britta Mccreedy, MD  ondansetron (ZOFRAN-ODT) 4 MG disintegrating tablet Take 1 tablet (4 mg total) by mouth every 8 (eight) hours as needed for nausea or vomiting. 08/16/21   Lamptey, Britta Mccreedy, MD    Family History Family History  Problem Relation Age of Onset   Fibromyalgia Mother     Social History Social History   Tobacco Use   Smoking status: Never   Smokeless tobacco: Never  Vaping Use   Vaping status: Never Used  Substance Use Topics   Alcohol use: Yes    Comment: SOCIAL   Drug use: Yes    Types: Marijuana     Allergies   Other   Review of Systems Review of Systems  Genitourinary:  Positive for frequency.  Per HPI   Physical Exam Triage Vital Signs ED Triage Vitals  Encounter Vitals Group     BP 04/04/23 1449 112/76     Systolic BP Percentile --      Diastolic BP Percentile --      Pulse Rate 04/04/23  1449 88     Resp 04/04/23 1449 20     Temp 04/04/23 1449 98.2 F (36.8 C)     Temp Source 04/04/23 1449 Oral     SpO2 04/04/23 1449 97 %     Weight 04/04/23 1442 130 lb (59 kg)     Height 04/04/23 1442 5\' 1"  (1.549 m)     Head Circumference --      Peak Flow --      Pain Score 04/04/23 1441 10     Pain Loc --      Pain Education --      Exclude from Growth Chart --    No data found.  Updated Vital Signs BP 112/76 (BP Location: Right Arm)   Pulse 88   Temp 98.2 F (36.8 C) (Oral)   Resp 20   Ht 5\' 1"  (1.549 m)   Wt 130 lb (59 kg)   SpO2 97%   BMI 24.56 kg/m   Visual Acuity Right Eye Distance:   Left Eye Distance:   Bilateral Distance:    Right Eye Near:   Left Eye Near:    Bilateral Near:     Physical Exam Vitals and nursing note reviewed.  Constitutional:      Appearance: She is not ill-appearing or toxic-appearing.  HENT:     Head: Normocephalic and atraumatic.     Right Ear: Hearing and external ear normal.     Left Ear: Hearing and external ear normal.     Nose: Nose normal.     Mouth/Throat:     Lips: Pink.  Eyes:     General: Lids are normal. Vision grossly intact. Gaze aligned appropriately.     Extraocular Movements: Extraocular movements intact.     Conjunctiva/sclera: Conjunctivae normal.  Pulmonary:     Effort: Pulmonary effort is normal.  Abdominal:     General: Bowel sounds are normal.     Palpations: Abdomen is soft.     Tenderness: There is no abdominal tenderness. There is no right CVA tenderness, left CVA tenderness or guarding.  Musculoskeletal:     Cervical back: Neck supple.  Skin:    General: Skin is warm and dry.     Capillary Refill: Capillary refill takes less than 2 seconds.     Findings: No rash.  Neurological:     General: No focal deficit present.  Mental Status: She is alert and oriented to person, place, and time. Mental status is at baseline.     Cranial Nerves: No dysarthria or facial asymmetry.  Psychiatric:         Mood and Affect: Mood normal.        Speech: Speech normal.        Behavior: Behavior normal.        Thought Content: Thought content normal.        Judgment: Judgment normal.      UC Treatments / Results  Labs (all labs ordered are listed, but only abnormal results are displayed) Labs Reviewed  POCT URINALYSIS DIP (MANUAL ENTRY) - Abnormal; Notable for the following components:      Result Value   Ketones, POC UA trace (5) (*)    Blood, UA large (*)    pH, UA 8.5 (*)    Protein Ur, POC =100 (*)    Leukocytes, UA Small (1+) (*)    All other components within normal limits  URINE CULTURE  POCT URINE PREGNANCY    EKG   Radiology No results found.  Procedures Procedures (including critical care time)  Medications Ordered in UC Medications - No data to display  Initial Impression / Assessment and Plan / UC Course  I have reviewed the triage vital signs and the nursing notes.  Pertinent labs & imaging results that were available during my care of the patient were reviewed by me and considered in my medical decision making (see chart for details).   1.  Acute cystitis with hematuria Presentation is consistent with acute uncomplicated cystitis. Patient is nontoxic in appearance with hemodynamically stable vital signs. Low suspicion for acute pyelonephritis. Low suspicion for kidney stone or infected stone. No indication for labs or imaging at this time.  Keflex antibiotic sent to pharmacy.  Urine culture pending. Patient to push fluids to stay well hydrated and reduce intake of known urinary irritants.  Counseled patient on potential for adverse effects with medications prescribed/recommended today, strict ER and return-to-clinic precautions discussed, patient verbalized understanding.    Final Clinical Impressions(s) / UC Diagnoses   Final diagnoses:  Acute cystitis with hematuria     Discharge Instructions      Your urine shows you likely have a urinary tract  infection.  I have sent your urine for culture to confirm this.  We will call you if we need to change your antibiotic when we find out the type of bacteria growing in your bladder.  Take antibiotic as directed with a snack/food to avoid stomach upset. To avoid GI upset please take this medication with food.   Avoid drinking beverages that irritate the urinary tract like sodas, tea, coffee, or juice. Drink plenty of water to stay well hydrated and prevent severe infection.  If you develop any new or worsening symptoms or if your symptoms do not start to improve, pleases return here or follow-up with your primary care provider. If your symptoms are severe, please go to the emergency room.     ED Prescriptions     Medication Sig Dispense Auth. Provider   cephALEXin (KEFLEX) 500 MG capsule  (Status: Discontinued) Take 1 capsule (500 mg total) by mouth 3 (three) times daily for 7 days. 21 capsule Reita May M, FNP   cephALEXin (KEFLEX) 500 MG capsule Take 1 capsule (500 mg total) by mouth 2 (two) times daily for 7 days. 14 capsule Carlisle Beers, FNP      PDMP  not reviewed this encounter.   Carlisle Beers, [place] 04/04/23 [phone]

## 2023-04-07 LAB — URINE CULTURE

## 2023-06-02 ENCOUNTER — Other Ambulatory Visit: Payer: Self-pay

## 2023-06-02 ENCOUNTER — Ambulatory Visit (HOSPITAL_COMMUNITY)
Admission: EM | Admit: 2023-06-02 | Discharge: 2023-06-02 | Disposition: A | Discharge status: Home / Self Care | Payer: MEDICAID | Attending: Internal Medicine | Admitting: Internal Medicine

## 2023-06-02 ENCOUNTER — Encounter (HOSPITAL_COMMUNITY): Payer: Self-pay | Admitting: *Deleted

## 2023-06-02 DIAGNOSIS — R1033 Periumbilical pain: Secondary | ICD-10-CM | POA: Diagnosis not present

## 2023-06-02 DIAGNOSIS — Z113 Encounter for screening for infections with a predominantly sexual mode of transmission: Secondary | ICD-10-CM | POA: Diagnosis present

## 2023-06-02 DIAGNOSIS — N898 Other specified noninflammatory disorders of vagina: Secondary | ICD-10-CM | POA: Diagnosis not present

## 2023-06-02 DIAGNOSIS — R102 Pelvic and perineal pain: Secondary | ICD-10-CM | POA: Diagnosis present

## 2023-06-02 LAB — POCT URINALYSIS DIP (MANUAL ENTRY)
Bilirubin, UA: NEGATIVE
Blood, UA: NEGATIVE
Glucose, UA: NEGATIVE mg/dL
Leukocytes, UA: NEGATIVE
Nitrite, UA: NEGATIVE
Spec Grav, UA: 1.025 (ref 1.010–1.025)
Urobilinogen, UA: 0.2 U/dL
pH, UA: 6.5 (ref 5.0–8.0)

## 2023-06-02 LAB — POCT URINE PREGNANCY: Preg Test, Ur: NEGATIVE

## 2023-06-02 NOTE — Discharge Instructions (Addendum)
Abdominal pain of unknown cause but clinically stable and reasonable to follow up with primary care MD that is scheduled for tomorrow.   Urinalysis done today: Does not appear to show an infection  Pregnancy test done today: Negative  Screening swab done today and results will be available in 24-28 hours. We will contact you if we need to arrange additional treatment based on your testing. Negative results will be on your MyChart account. Abstain from sex until you receive your final results.  Use a condom for sexual encounters. If you have any worsening or changing symptoms including abnormal discharge, pelvic pain, abdominal pain, fever, nausea, or vomiting, then you should be reevaluated.

## 2023-06-02 NOTE — ED Triage Notes (Addendum)
a 

## 2023-06-02 NOTE — ED Provider Notes (Signed)
[facility]    CSN: [id number] Arrival date & time: 06/02/23  1305      History   Chief Complaint Chief Complaint  Patient presents with   Abdominal Pain    HPI Rose Schwartz is a 22 y.o. female.   22 yr old female who presents to urgent care with complaints of mid to lower abdominal pain.  This started on Friday night.  The pains are shooting in nature but also can be somewhat nagging.  She denies any dysuria, hematuria, nausea, vomiting, diarrhea, constipation.  She does relate some increased vaginal discharge but no vaginal pain.  She does report that her current sexual partner potentially could have been unfaithful.  She also relates that she is only about 90% sure she is not pregnant.  She denies any reflux-like symptoms and is having no problems eating or drinking.   Abdominal Pain Associated symptoms: vaginal discharge   Associated symptoms: no chest pain, no chills, no constipation, no cough, no diarrhea, no dysuria, no fever, no hematuria, no nausea, no shortness of breath, no sore throat and no vomiting     Past Medical History:  Diagnosis Date   Anemia    Asthma    Bipolar 1 disorder (HCC)    Frequent urination 10/11/2020   UTI (urinary tract infection)     Patient Active Problem List   Diagnosis Date Noted   Chronic headaches 12/18/2020   Chest pain 12/18/2020   Eczema 10/11/2020   Encounter to establish care with new doctor 06/23/2020   Alpha thalassemia silent carrier 08/04/2019   Asthma    Bipolar 1 disorder (HCC)     Past Surgical History:  Procedure Laterality Date   CYST EXCISION Left 03/21/2020   Procedure: Left wrist ganglion cyst excision;  Surgeon: [name], MD;  Location: [facility];  Service: Plastics;  Laterality: Left;  total case time is 1 hour   FETAL AMNIOTIC BAND RELEASE     TRIGGER FINGER RELEASE Left 03/21/2020   Procedure: first dorsal extensor compartment release;  Surgeon: [name],  MD;  Location: [facility];  Service: Plastics;  Laterality: Left;    OB History     Gravida  1   Para  1   Term  1   Preterm      AB      Living  1      SAB      IAB      Ectopic      Multiple  0   Live Births  1            Home Medications    Prior to Admission medications   Medication Sig Start Date End Date Taking? Authorizing Provider  albuterol (VENTOLIN HFA) 108 (90 Base) MCG/ACT inhaler Inhale 2 puffs into the lungs every 4 (four) hours as needed for wheezing or shortness of breath (cough, shortness of breath or wheezing.). 12/18/18  Yes Elvina Sidle, MD  ELDERBERRY PO Take by mouth.    [provider]  fluconazole (DIFLUCAN) 150 MG tablet Take 1 tablet (150 mg total) by mouth daily. 11/02/21   Merrilee Jansky, MD  ibuprofen (ADVIL) 600 MG tablet Take 1 tablet (600 mg total) by mouth every 6 (six) hours. 10/09/20   Katha Cabal, DO  loratadine (CLARITIN) 10 MG tablet Take 1 tablet (10 mg total) by mouth daily. 03/13/22   LampteyBritta Mccreedy, MD  metroNIDAZOLE (FLAGYL) 500 MG  tablet Take 1 tablet (500 mg total) by mouth 2 (two) times daily. 11/02/21   Lamptey, Britta Mccreedy, MD  ondansetron (ZOFRAN-ODT) 4 MG disintegrating tablet Take 1 tablet (4 mg total) by mouth every 8 (eight) hours as needed for nausea or vomiting. 08/16/21   Lamptey, Britta Mccreedy, MD    Family History Family History  Problem Relation Age of Onset   Fibromyalgia Mother     Social History Social History   Tobacco Use   Smoking status: Never   Smokeless tobacco: Never  Vaping Use   Vaping status: Never Used  Substance Use Topics   Alcohol use: Yes    Comment: SOCIAL   Drug use: Yes    Types: Marijuana     Allergies   Other   Review of Systems Review of Systems  Constitutional:  Negative for chills and fever.  HENT:  Negative for ear pain and sore throat.   Eyes:  Negative for pain and visual disturbance.  Respiratory:  Negative for cough and  shortness of breath.   Cardiovascular:  Negative for chest pain and palpitations.  Gastrointestinal:  Positive for abdominal pain. Negative for constipation, diarrhea, nausea and vomiting.  Genitourinary:  Positive for vaginal discharge. Negative for difficulty urinating, dysuria, frequency and hematuria.  Musculoskeletal:  Negative for arthralgias and back pain.  Skin:  Negative for color change and rash.  Neurological:  Negative for seizures and syncope.  All other systems reviewed and are negative.    Physical Exam Triage Vital Signs ED Triage Vitals  Encounter Vitals Group     BP 06/02/23 1501 112/75     Systolic BP Percentile --      Diastolic BP Percentile --      Pulse Rate 06/02/23 1501 96     Resp 06/02/23 1501 16     Temp 06/02/23 1501 98.4 F (36.9 C)     Temp src --      SpO2 06/02/23 1501 97 %     Weight --      Height --      Head Circumference --      Peak Flow --      Pain Score 06/02/23 1458 6     Pain Loc --      Pain Education --      Exclude from Growth Chart --    No data found.  Updated Vital Signs BP 112/75   Pulse 96   Temp 98.4 F (36.9 C)   Resp 16   SpO2 97%   Visual Acuity Right Eye Distance:   Left Eye Distance:   Bilateral Distance:    Right Eye Near:   Left Eye Near:    Bilateral Near:     Physical Exam Vitals and nursing note reviewed.  Constitutional:      General: She is not in acute distress.    Appearance: She is well-developed.  HENT:     Head: Normocephalic and atraumatic.  Eyes:     Conjunctiva/sclera: Conjunctivae normal.  Cardiovascular:     Rate and Rhythm: Normal rate and regular rhythm.     Heart sounds: No murmur heard. Pulmonary:     Effort: Pulmonary effort is normal. No respiratory distress.     Breath sounds: Normal breath sounds.  Abdominal:     General: Bowel sounds are normal.     Palpations: Abdomen is soft.     Tenderness: There is abdominal tenderness in the periumbilical area and suprapubic  area. There is no  right CVA tenderness, left CVA tenderness, guarding or rebound. Negative signs include Murphy's sign.     Hernia: No hernia is present.  Musculoskeletal:        General: No swelling.     Cervical back: Neck supple.  Skin:    General: Skin is warm and dry.     Capillary Refill: Capillary refill takes less than 2 seconds.  Neurological:     Mental Status: She is alert.  Psychiatric:        Mood and Affect: Mood normal.      UC Treatments / Results  Labs (all labs ordered are listed, but only abnormal results are displayed) Labs Reviewed - No data to display  EKG   Radiology No results found.  Procedures Procedures (including critical care time)  Medications Ordered in UC Medications - No data to display  Initial Impression / Assessment and Plan / UC Course  I have reviewed the triage vital signs and the nursing notes.  Pertinent labs & imaging results that were available during my care of the patient were reviewed by me and considered in my medical decision making (see chart for details).     Periumbilical abdominal pain  Suprapubic pain  Vaginal discharge  Screening examination for STI   Abdominal pain of unknown cause but clinically stable and reasonable to follow up with primary care MD that is scheduled for tomorrow.   Urinalysis done today: Does not appear to show an infection  Pregnancy test done today: Negative  Screening swab done today and results will be available in 24-28 hours. We will contact you if we need to arrange additional treatment based on your testing. Negative results will be on your MyChart account. Abstain from sex until you receive your final results.  Use a condom for sexual encounters. If you have any worsening or changing symptoms including abnormal discharge, pelvic pain, abdominal pain, fever, nausea, or vomiting, then you should be reevaluated.    Final Clinical Impressions(s) / UC Diagnoses   Final diagnoses:   None   Discharge Instructions   None    ED Prescriptions   None    PDMP not reviewed this encounter.   Landis Martins, [place] 06/02/23 1615

## 2023-06-02 NOTE — ED Triage Notes (Addendum)
Pt called in front lobby no answer.

## 2023-06-02 NOTE — ED Triage Notes (Signed)
Pt has had sharpe ABD pains since Friday.No other Sx's.

## 2023-06-03 LAB — CERVICOVAGINAL ANCILLARY ONLY
Bacterial Vaginitis (gardnerella): POSITIVE — AB
Candida Glabrata: NEGATIVE
Candida Vaginitis: POSITIVE — AB
Chlamydia: NEGATIVE
Comment: NEGATIVE
Comment: NEGATIVE
Comment: NEGATIVE
Comment: NEGATIVE
Comment: NEGATIVE
Comment: NORMAL
Neisseria Gonorrhea: NEGATIVE
Trichomonas: NEGATIVE

## 2023-06-04 ENCOUNTER — Telehealth: Payer: Self-pay

## 2023-06-04 ENCOUNTER — Other Ambulatory Visit: Payer: Self-pay | Admitting: Physician Assistant

## 2023-06-04 DIAGNOSIS — R102 Pelvic and perineal pain: Secondary | ICD-10-CM

## 2023-06-04 MED ORDER — FLUCONAZOLE 150 MG PO TABS: 150.0000 mg | ORAL_TABLET | Freq: Once | ORAL | 0 refills | Status: AC

## 2023-06-04 MED ORDER — METRONIDAZOLE 0.75 % VA GEL: 1.0000 | Freq: Every day | VAGINAL | 0 refills | Status: AC

## 2023-06-04 NOTE — Telephone Encounter (Signed)
Per protocol, pt requires tx with metronidazole and Diflucan. Reviewed with patient, verified pharmacy, prescription sent.

## 2023-06-09 ENCOUNTER — Ambulatory Visit
Admission: RE | Admit: 2023-06-09 | Discharge: 2023-06-09 | Disposition: A | Discharge status: Home / Self Care | Payer: MEDICAID | Source: Ambulatory Visit | Attending: Physician Assistant | Admitting: Physician Assistant

## 2023-06-09 DIAGNOSIS — R102 Pelvic and perineal pain: Secondary | ICD-10-CM

## 2023-09-15 ENCOUNTER — Encounter (HOSPITAL_COMMUNITY): Payer: Self-pay

## 2023-09-15 ENCOUNTER — Ambulatory Visit (HOSPITAL_COMMUNITY)
Admission: EM | Admit: 2023-09-15 | Discharge: 2023-09-15 | Disposition: A | Discharge status: Home / Self Care | Payer: MEDICAID | Attending: Emergency Medicine | Admitting: Emergency Medicine

## 2023-09-15 DIAGNOSIS — N390 Urinary tract infection, site not specified: Secondary | ICD-10-CM

## 2023-09-15 DIAGNOSIS — Z3202 Encounter for pregnancy test, result negative: Secondary | ICD-10-CM

## 2023-09-15 DIAGNOSIS — R42 Dizziness and giddiness: Secondary | ICD-10-CM

## 2023-09-15 LAB — POCT URINALYSIS DIP (MANUAL ENTRY)
Bilirubin, UA: NEGATIVE
Blood, UA: NEGATIVE
Glucose, UA: NEGATIVE mg/dL
Ketones, POC UA: NEGATIVE mg/dL
Nitrite, UA: NEGATIVE
Protein Ur, POC: NEGATIVE mg/dL
Spec Grav, UA: 1.025 (ref 1.010–1.025)
Urobilinogen, UA: 0.2 U/dL
pH, UA: 7 (ref 5.0–8.0)

## 2023-09-15 LAB — POCT URINE PREGNANCY: Preg Test, Ur: NEGATIVE

## 2023-09-15 MED ORDER — SULFAMETHOXAZOLE-TRIMETHOPRIM 800-160 MG PO TABS: 1.0000 | ORAL_TABLET | Freq: Two times a day (BID) | ORAL | 0 refills | Status: AC

## 2023-09-15 NOTE — ED Triage Notes (Signed)
 Pt states woke up dizzy with am and couldn't work. Needs work note. States not dizzy right now. States hx of dizzy spells.

## 2023-09-15 NOTE — ED Provider Notes (Signed)
 [facility]    CSN: [id number] Arrival date & time: 09/15/23  1005      History   Chief Complaint Chief Complaint  Patient presents with   Dizziness    HPI Rose Schwartz is a 23 y.o. female.   Patient presents today for a work note.  She states that this a.m. she has some dizziness none at the time.  Has a history of this but never seen neurology for it.  Patient states that after she drank a ginger ale and ate a rice crispy retreat she felt better.  Denies any illness no fever no chest pain no shortness of breath.  Patient is asking for a urine pregnancy test.  She currently has an IUD but still with like this to be checked while she is here.     Past Medical History:  Diagnosis Date   Anemia    Asthma    Bipolar 1 disorder (HCC)    Frequent urination 10/11/2020   UTI (urinary tract infection)     Patient Active Problem List   Diagnosis Date Noted   Chronic headaches 12/18/2020   Chest pain 12/18/2020   Eczema 10/11/2020   Encounter to establish care with new doctor 06/23/2020   Alpha thalassemia silent carrier 08/04/2019   Asthma    Bipolar 1 disorder [facility])     Past Surgical History:  Procedure Laterality Date   CYST EXCISION Left 03/21/2020   Procedure: Left wrist ganglion cyst excision;  Surgeon: [name], MD;  Location: [facility];  Service: Plastics;  Laterality: Left;  total case time is 1 hour   FETAL AMNIOTIC BAND RELEASE     TRIGGER FINGER RELEASE Left 03/21/2020   Procedure: first dorsal extensor compartment release;  Surgeon: [name], MD;  Location: [facility];  Service: Plastics;  Laterality: Left;    OB History     Gravida  1   Para  1   Term  1   Preterm      AB      Living  1      SAB      IAB      Ectopic      Multiple  0   Live Births  1            Home Medications    Prior to Admission medications   Medication Sig Start Date End Date Taking?  Authorizing Provider  sulfamethoxazole-trimethoprim (BACTRIM DS) 800-160 MG tablet Take 1 tablet by mouth 2 (two) times daily for 7 days. 09/15/23 09/22/23 Yes Coralyn Mark, NP  albuterol (VENTOLIN HFA) 108 (90 Base) MCG/ACT inhaler Inhale 2 puffs into the lungs every 4 (four) hours as needed for wheezing or shortness of breath (cough, shortness of breath or wheezing.). 12/18/18   Elvina Sidle, MD  ELDERBERRY PO Take by mouth.    [provider]  ibuprofen (ADVIL) 600 MG tablet Take 1 tablet (600 mg total) by mouth every 6 (six) hours. 10/09/20   Katha Cabal, DO  loratadine (CLARITIN) 10 MG tablet Take 1 tablet (10 mg total) by mouth daily. 03/13/22   Lamptey, Britta Mccreedy, MD  ondansetron (ZOFRAN-ODT) 4 MG disintegrating tablet Take 1 tablet (4 mg total) by mouth every 8 (eight) hours as needed for nausea or vomiting. 08/16/21   Lamptey, Britta Mccreedy, MD    Family History Family History  Problem Relation Age of Onset   Fibromyalgia Mother  Social History Social History   Tobacco Use   Smoking status: Never   Smokeless tobacco: Never  Vaping Use   Vaping status: Never Used  Substance Use Topics   Alcohol use: Yes    Comment: SOCIAL   Drug use: Yes    Types: Marijuana     Allergies   Other   Review of Systems Review of Systems  Constitutional:  Negative for activity change, chills and fever.  Respiratory: Negative.    Cardiovascular: Negative.   Gastrointestinal: Negative.   Genitourinary: Negative.   Neurological:  Positive for dizziness. Negative for light-headedness, numbness and headaches.     Physical Exam Triage Vital Signs ED Triage Vitals [09/15/23 1110]  Encounter Vitals Group     BP 124/84     Systolic BP Percentile      Diastolic BP Percentile      Pulse Rate 63     Resp 18     Temp 98 F (36.7 C)     Temp Source Oral     SpO2 99 %     Weight      Height      Head Circumference      Peak Flow      Pain Score 0     Pain Loc      Pain  Education      Exclude from Growth Chart    No data found.  Updated Vital Signs BP 124/84 (BP Location: Right Arm)   Pulse 63   Temp 98 F (36.7 C) (Oral)   Resp 18   SpO2 99%   Breastfeeding No   Visual Acuity Right Eye Distance:   Left Eye Distance:   Bilateral Distance:    Right Eye Near:   Left Eye Near:    Bilateral Near:     Physical Exam HENT:     Head: Normocephalic.     Nose: Nose normal.  Eyes:     Pupils: Pupils are equal, round, and reactive to light.  Cardiovascular:     Rate and Rhythm: Normal rate.  Pulmonary:     Effort: Pulmonary effort is normal.  Musculoskeletal:        General: Normal range of motion.  Neurological:     General: No focal deficit present.     Mental Status: She is alert. Mental status is at baseline.      UC Treatments / Results  Labs (all labs ordered are listed, but only abnormal results are displayed) Labs Reviewed  POCT URINALYSIS DIP (MANUAL ENTRY) - Abnormal; Notable for the following components:      Result Value   Clarity, UA cloudy (*)    Leukocytes, UA Trace (*)    All other components within normal limits  POCT URINE PREGNANCY - Normal    EKG   Radiology No results found.  Procedures Procedures (including critical care time)  Medications Ordered in UC Medications - No data to display  Initial Impression / Assessment and Plan / UC Course  I have reviewed the triage vital signs and the nursing notes.  Pertinent labs & imaging results that were available during my care of the patient were reviewed by me and considered in my medical decision making (see chart for details).     Discussed with patient she will need to have follow-up with neurology She is okay to return to work today if symptoms seem to continue patient will need to be seen in the ER for further evaluation and scans  Patient is alert and oriented at this time requesting urine pregnancy even though she does have an IUD placed urine  pregnancy is negative Does show some slight leukocytes for possible UTI we will treat this   Final Clinical Impressions(s) / UC Diagnoses   Final diagnoses:  Dizziness  Lower urinary tract infectious disease     Discharge Instructions      Discussed you will need to follow-up with the neurologist If symptoms become worse or come back throughout the day you need to be seen in the ER for further testing Stay hydrated drink plenty of fluids      ED Prescriptions     Medication Sig Dispense Auth. Provider   sulfamethoxazole-trimethoprim (BACTRIM DS) 800-160 MG tablet Take 1 tablet by mouth 2 (two) times daily for 7 days. 14 tablet Coralyn Mark, NP      PDMP not reviewed this encounter.   Coralyn Mark, NP 09/15/23 1148

## 2023-09-15 NOTE — Discharge Instructions (Addendum)
 Discussed you will need to follow-up with the neurologist If symptoms become worse or come back throughout the day you need to be seen in the ER for further testing Stay hydrated drink plenty of fluids

## 2023-10-07 ENCOUNTER — Encounter (HOSPITAL_COMMUNITY): Payer: Self-pay

## 2023-10-07 ENCOUNTER — Emergency Department (HOSPITAL_COMMUNITY): Payer: MEDICAID

## 2023-10-07 ENCOUNTER — Emergency Department (HOSPITAL_COMMUNITY)
Admission: EM | Admit: 2023-10-07 | Discharge: 2023-10-07 | Disposition: A | Discharge status: Home / Self Care | Payer: MEDICAID | Attending: Emergency Medicine | Admitting: Emergency Medicine

## 2023-10-07 ENCOUNTER — Other Ambulatory Visit: Payer: Self-pay

## 2023-10-07 DIAGNOSIS — J45909 Unspecified asthma, uncomplicated: Secondary | ICD-10-CM | POA: Diagnosis not present

## 2023-10-07 DIAGNOSIS — J189 Pneumonia, unspecified organism: Secondary | ICD-10-CM | POA: Diagnosis not present

## 2023-10-07 DIAGNOSIS — R739 Hyperglycemia, unspecified: Secondary | ICD-10-CM | POA: Insufficient documentation

## 2023-10-07 DIAGNOSIS — R079 Chest pain, unspecified: Secondary | ICD-10-CM | POA: Diagnosis present

## 2023-10-07 DIAGNOSIS — D72829 Elevated white blood cell count, unspecified: Secondary | ICD-10-CM | POA: Diagnosis not present

## 2023-10-07 LAB — RESP PANEL BY RT-PCR (RSV, FLU A&B, COVID)  RVPGX2
Influenza A by PCR: NEGATIVE
Influenza B by PCR: NEGATIVE
Resp Syncytial Virus by PCR: NEGATIVE
SARS Coronavirus 2 by RT PCR: NEGATIVE

## 2023-10-07 LAB — CBC
HCT: 45.6 % (ref 36.0–46.0)
Hemoglobin: 14.9 g/dL (ref 12.0–15.0)
MCH: 27.9 pg (ref 26.0–34.0)
MCHC: 32.7 g/dL (ref 30.0–36.0)
MCV: 85.2 fL (ref 80.0–100.0)
Platelets: 273 10*3/uL (ref 150–400)
RBC: 5.35 MIL/uL — ABNORMAL HIGH (ref 3.87–5.11)
RDW: 12.9 % (ref 11.5–15.5)
WBC: 12.4 10*3/uL — ABNORMAL HIGH (ref 4.0–10.5)
nRBC: 0 % (ref 0.0–0.2)

## 2023-10-07 LAB — BASIC METABOLIC PANEL WITH GFR
Anion gap: 8 (ref 5–15)
BUN: 10 mg/dL (ref 6–20)
CO2: 23 mmol/L (ref 22–32)
Calcium: 9.3 mg/dL (ref 8.9–10.3)
Chloride: 106 mmol/L (ref 98–111)
Creatinine, Ser: 0.89 mg/dL (ref 0.44–1.00)
GFR, Estimated: >60 mL/min (ref >60)
Glucose, Bld: 115 mg/dL — ABNORMAL HIGH (ref 70–99)
Potassium: 4 mmol/L (ref 3.5–5.1)
Sodium: 137 mmol/L (ref 135–145)

## 2023-10-07 LAB — TROPONIN I (HIGH SENSITIVITY): Troponin I (High Sensitivity): <2 ng/L (ref <18)

## 2023-10-07 LAB — HCG, SERUM, QUALITATIVE: Preg, Serum: NEGATIVE

## 2023-10-07 MED ORDER — SODIUM CHLORIDE 0.9 % IV SOLN
2.0000 g | Freq: Once | INTRAVENOUS | Status: AC
  Administered 2023-10-07: 2 g via INTRAVENOUS
  Filled 2023-10-07: qty 20

## 2023-10-07 MED ORDER — SODIUM CHLORIDE 0.9 % IV BOLUS
1000.0000 mL | Freq: Once | INTRAVENOUS | Status: AC
  Administered 2023-10-07: 1000 mL via INTRAVENOUS

## 2023-10-07 MED ORDER — KETOROLAC TROMETHAMINE 30 MG/ML IJ SOLN
30.0000 mg | Freq: Once | INTRAMUSCULAR | Status: AC
  Administered 2023-10-07: 30 mg via INTRAVENOUS
  Filled 2023-10-07: qty 1

## 2023-10-07 MED ORDER — CEFDINIR 300 MG PO CAPS: 300.0000 mg | ORAL_CAPSULE | Freq: Two times a day (BID) | ORAL | 0 refills | Status: AC

## 2023-10-07 NOTE — ED Triage Notes (Signed)
 Pt presents via POV c/o chest pain, body aches, and headache starting last night. Reports had "shot for allergies".

## 2023-10-07 NOTE — ED Provider Notes (Signed)
 [place] EMERGENCY DEPARTMENT AT [facility] Provider Note   CSN: [id number] Arrival date & time: 10/07/23  0450     History  Chief Complaint  Patient presents with   Chest Pain    Rose Schwartz is a 23 y.o. female.  The history is provided by the patient.  Chest Pain She has history of asthma, bipolar disorder and comes in complaining of subjective fever and generalized bodyaches which started tonight.  She has had chills and sweats.  She is also complaining of pain in her chest which actually seems to be better after she burps.  She does endorse cough productive of small amount of white sputum.  She denies any sick contacts.  She is a non-smoker.  Of note, she did have an allergy shot yesterday and states that she had just general malaise before she had the injection.   Home Medications Prior to Admission medications   Medication Sig Start Date End Date Taking? Authorizing Provider  cefdinir (OMNICEF) 300 MG capsule Take 1 capsule (300 mg total) by mouth 2 (two) times daily. 10/07/23  Yes Dione Booze, MD  albuterol (VENTOLIN HFA) 108 (90 Base) MCG/ACT inhaler Inhale 2 puffs into the lungs every 4 (four) hours as needed for wheezing or shortness of breath (cough, shortness of breath or wheezing.). 12/18/18   Elvina Sidle, MD  ELDERBERRY PO Take by mouth.    [provider]  ibuprofen (ADVIL) 600 MG tablet Take 1 tablet (600 mg total) by mouth every 6 (six) hours. 10/09/20   Katha Cabal, DO  loratadine (CLARITIN) 10 MG tablet Take 1 tablet (10 mg total) by mouth daily. 03/13/22   Lamptey, Britta Mccreedy, MD  ondansetron (ZOFRAN-ODT) 4 MG disintegrating tablet Take 1 tablet (4 mg total) by mouth every 8 (eight) hours as needed for nausea or vomiting. 08/16/21   Lamptey, Britta Mccreedy, MD      Allergies    Other    Review of Systems   Review of Systems  Cardiovascular:  Positive for chest pain.  All other systems reviewed and are negative.   Physical  Exam Updated Vital Signs BP (!) 127/97 (BP Location: Right Arm)   Pulse (!) 117   Temp 98.6 F (37 C)   Resp 14   SpO2 100%  Physical Exam Vitals and nursing note reviewed.   23 year old female, resting comfortably and in no acute distress. Vital signs are significant for elevated blood pressure and elevated heart rate. Oxygen saturation is 100%, which is normal. Head is normocephalic and atraumatic. PERRLA, EOMI. Oropharynx is clear.  There is no sinus tenderness. Neck is nontender and supple. Back is nontender and there is no CVA tenderness. Lungs are clear without rales, wheezes, or rhonchi. Chest is nontender. Heart has regular rate and rhythm without murmur. Abdomen is soft, flat, nontender. 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 Skin is warm and dry without rash. Neurologic: Mental status is normal, moves all extremities equally.  ED Results / Procedures / Treatments   Labs (all labs ordered are listed, but only abnormal results are displayed) Labs Reviewed  BASIC METABOLIC PANEL WITH GFR - Abnormal; Notable for the following components:      Result Value   Glucose, Bld 115 (*)    All other components within normal limits  CBC - Abnormal; Notable for the following components:   WBC 12.4 (*)    RBC 5.35 (*)    All  other components within normal limits  RESP PANEL BY RT-PCR (RSV, FLU A&B, COVID)  RVPGX2  HCG, SERUM, QUALITATIVE  TROPONIN I (HIGH SENSITIVITY)    EKG EKG Interpretation Date/Time:  Tuesday October 07 2023 05:04:23 EDT Ventricular Rate:  126 PR Interval:  162 QRS Duration:  70 QT Interval:  300 QTC Calculation: 434 R Axis:   84  Text Interpretation: Sinus tachycardia Nonspecific T wave abnormality Abnormal ECG When compared with ECG of 12-Jun-2021 15:02, HEART RATE has increased T wave abnormality is now present  - possibly rate-related Confirmed by Dione Booze ([id number]) on 10/07/2023 5:37:55  AM  Radiology DG Chest 2 View Result Date: 10/07/2023 CLINICAL DATA:  23 year old female with chest pain, headache, body ache. EXAM: CHEST - 2 VIEW COMPARISON:  None Available. FINDINGS: PA and lateral views ear 0508 hours. EKG button artifact right mid lung. Lung volumes and mediastinal contours are normal. Visualized tracheal air column is within normal limits. Lung markings appear normal except at the right middle lobe or anterior basal segment right lower lobe where there is increased density along the right heart border, posterior heart border on both views. No pleural effusion. No pneumothorax. Levoconvex thoracolumbar scoliosis is mild. No acute osseous abnormality identified. Negative visible bowel gas. IMPRESSION: Cannot exclude peribronchial airspace disease in the medial segment of the right middle lobe versus anterior basal segment of the right lower lobe. Query signs/symptoms of pneumonia. No pleural effusion. Electronically Signed   By: Odessa Fleming M.D.   On: 10/07/2023 05:22    Procedures Procedures  Cardiac monitor shows normal sinus rhythm, per my interpretation.  Medications Ordered in ED Medications  ketorolac (TORADOL) 30 MG/ML injection 30 mg (30 mg Intravenous Given 10/07/23 0607)  sodium chloride 0.9 % bolus 1,000 mL (1,000 mLs Intravenous New Bag/Given 10/07/23 0607)  cefTRIAXone (ROCEPHIN) 2 g in sodium chloride 0.9 % 100 mL IVPB (0 g Intravenous Stopped 10/07/23 0644)    ED Course/ Medical Decision Making/ A&P                                 Medical Decision Making Amount and/or Complexity of Data Reviewed Labs: ordered. Radiology: ordered.  Risk Prescription drug management.   Influenza-like illness, consider pneumonia.  Consider other respiratory pathogen such as RSV, COVID-19.  I have reviewed her electrocardiogram, my interpretation is sinus tachycardia with no acute ST or T changes.  Chest x-ray shows possible pneumonia in the medial segment of the right middle  lobe.  I have independently viewed the images, and agree with the radiologist's interpretation.  I reviewed her laboratory test, my interpretation is she is not pregnant, elevated random glucose which will need to be followed as an outpatient, mild leukocytosis which is nonspecific, normal troponin.  I do not suspect coronary artery disease, no need for repeat troponin.  Respiratory pathogen panel is pending.  I have ordered IV fluids, ketorolac for pain, ceftriaxone for pneumonia.  She is feeling somewhat better following above-noted treatment.  I am discharging her with prescription for cefdinir, advised to use NSAIDs and acetaminophen as needed for pain.  Return if symptoms are getting worse.  Final Clinical Impression(s) / ED Diagnoses Final diagnoses:  Community acquired pneumonia of right lung, unspecified part of lung  Elevated random blood glucose level    Rx / DC Orders ED Discharge Orders          Ordered    cefdinir (OMNICEF) 300  MG capsule  2 times daily        10/07/23 0700              Dione Booze, MD 10/07/23 [phone]

## 2023-10-07 NOTE — Discharge Instructions (Addendum)
 Drink plenty of fluids.  Take ibuprofen and/or acetaminophen as needed for fever or aching.  Return if symptoms are getting worse.

## 2023-10-22 ENCOUNTER — Ambulatory Visit: Payer: MEDICAID | Admitting: Neurology

## 2023-10-23 ENCOUNTER — Telehealth: Payer: Self-pay | Admitting: Neurology

## 2023-10-23 ENCOUNTER — Ambulatory Visit: Payer: MEDICAID | Admitting: Neurology

## 2023-10-23 NOTE — Telephone Encounter (Signed)
 r/s appointment due to a conflict

## 2024-01-05 ENCOUNTER — Ambulatory Visit: Payer: MEDICAID | Admitting: Neurology

## 2024-01-05 ENCOUNTER — Encounter: Payer: Self-pay | Admitting: Neurology

## 2024-03-15 ENCOUNTER — Ambulatory Visit: Payer: MEDICAID | Admitting: Obstetrics and Gynecology

## 2024-03-19 ENCOUNTER — Ambulatory Visit (INDEPENDENT_AMBULATORY_CARE_PROVIDER_SITE_OTHER): Payer: MEDICAID | Admitting: Obstetrics and Gynecology

## 2024-03-19 ENCOUNTER — Other Ambulatory Visit (HOSPITAL_COMMUNITY)
Admission: RE | Admit: 2024-03-19 | Discharge: 2024-03-19 | Disposition: A | Discharge status: Home / Self Care | Payer: MEDICAID | Source: Ambulatory Visit | Attending: Obstetrics and Gynecology | Admitting: Obstetrics and Gynecology

## 2024-03-19 ENCOUNTER — Encounter: Payer: Self-pay | Admitting: Obstetrics and Gynecology

## 2024-03-19 VITALS — BP 102/70 | HR 80 | Ht 60.0 in | Wt 127.0 lb

## 2024-03-19 DIAGNOSIS — Z01419 Encounter for gynecological examination (general) (routine) without abnormal findings: Secondary | ICD-10-CM | POA: Diagnosis present

## 2024-03-19 DIAGNOSIS — R102 Pelvic and perineal pain: Secondary | ICD-10-CM

## 2024-03-19 DIAGNOSIS — Z113 Encounter for screening for infections with a predominantly sexual mode of transmission: Secondary | ICD-10-CM

## 2024-03-19 DIAGNOSIS — G8929 Other chronic pain: Secondary | ICD-10-CM | POA: Diagnosis not present

## 2024-03-19 DIAGNOSIS — Z124 Encounter for screening for malignant neoplasm of cervix: Secondary | ICD-10-CM | POA: Insufficient documentation

## 2024-03-19 DIAGNOSIS — N941 Unspecified dyspareunia: Secondary | ICD-10-CM | POA: Diagnosis not present

## 2024-03-19 NOTE — Progress Notes (Signed)
 Pt is in office for routine AEX. Pt has hx of pelvic pain, hx of pelvic floor dysfunction.  Pt has IUD for contraception, placed in 2021.  Pt currently has a mental health provider.

## 2024-03-19 NOTE — Progress Notes (Signed)
 ANNUAL EXAM Patient name: Rose Schwartz MRN [id number]  Date of birth: 06-Jun-2001 Chief Complaint:   Annual Exam  History of Present Illness:   Rose Schwartz is a 23 y.o. G1P1001 being seen today for a routine annual exam.  Current complaints: annual/pap, pelvic pain, intermittent vagina odor  Discussed the use of AI scribe software for clinical note transcription with the patient, who gave verbal consent to proceed.  History of Present Illness Rose Schwartz is a 23 year old female who presents with random vaginal bleeding and pelvic pain.  She experiences unpredictable vaginal bleeding despite having an IUD in place. The bleeding is random, sometimes appearing and disappearing quickly. She is sexually active with a female partner and has consented to STD testing, including blood tests for HIV, syphilis, and hepatitis, as well as vaginal tests for gonorrhea, chlamydia, and trichomoniasis.  She has a history of pelvic pain and has previously undergone pelvic floor physical therapy. The pain occurs sometimes during sexual activity and can be quite uncomfortable. She performs home exercises regularly, including stretching exercises, and is interested in reengaging in pelvic floor therapy with a previous physical therapist.  She mentions having an inverted uterus and a history of very painful periods. She inquires about the possibility of endometriosis, noting symptoms such as painful periods and occasional pain during intercourse. She currently uses an IUD.  She reports occasional constipation but generally maintains regular bowel movements.    No LMP recorded. (Menstrual status: IUD).   The pregnancy intention screening data noted above was reviewed. Potential methods of contraception were discussed. The patient elected to proceed with No data recorded.   Last pap     Component Value Date/Time   DIAGPAP  02/16/2020 1217    - Negative for intraepithelial  lesion or malignancy (NILM)   ADEQPAP  02/16/2020 1217    Satisfactory for evaluation; transformation zone component PRESENT.    Last mammogram: n/a.  Last colonoscopy: n/a.      06/12/2021    1:47 PM 10/09/2020   10:12 AM 08/09/2020    3:48 PM 07/11/2020    1:20 PM 06/20/2020    3:09 PM  Depression screen PHQ 2/9  Decreased Interest 1 1 2 1 1   Down, Depressed, Hopeless 1 1 1 1 1   PHQ - 2 Score 2 2 3 2 2   Altered sleeping 3 2 2 2 2   Tired, decreased energy 2 1 3 2 3   Change in appetite 2 3 1 3 3   Feeling bad or failure about yourself  1 1 1  0 1  Trouble concentrating 3 1 3 2 1   Moving slowly or fidgety/restless 0 0 0 0 0  Suicidal thoughts 0 0 0 0 0  PHQ-9 Score 13 10 13 11 12   Difficult doing work/chores Somewhat difficult   Extremely dIfficult         08/09/2020    3:48 PM 02/01/2020    1:29 PM 01/11/2020    1:43 PM 01/03/2020    3:23 PM  GAD 7 : Generalized Anxiety Score  Nervous, Anxious, on Edge 2 1 1 1   Control/stop worrying 2 2 2 1   Worry too much - different things 3 3 2 2   Trouble relaxing 2 1 1 1   Restless 1 0 1 1  Easily annoyed or irritable 1 0 2 2  Afraid - awful might happen 3 2 3 2   Total GAD 7 Score 14 9 12  10  Review of Systems:   Pertinent items are noted in HPI Denies any headaches, blurred vision, fatigue, shortness of breath, chest pain, abdominal pain, abnormal vaginal discharge/itching/odor/irritation, problems with periods, bowel movements, urination, or intercourse unless otherwise stated above. Pertinent History Reviewed:  Reviewed past medical,surgical, social and family history.  Reviewed problem list, medications and allergies. Physical Assessment:   Vitals:   03/19/24 0934  BP: 102/70  Pulse: 80  Weight: 127 lb (57.6 kg)  Height: 5' (1.524 m)  Body mass index is 24.8 kg/m.        Physical Examination:   General appearance - well appearing, and in no distress  Mental status - alert, oriented to person, place, and time  Psych:  She  has a normal mood and affect  Skin - warm and dry, normal color, no suspicious lesions noted  Chest - effort normal, all lung fields clear to auscultation bilaterally  Heart - normal rate and regular rhythm  Breasts - breasts appear normal, no suspicious masses, no skin or nipple changes or  axillary nodes  Abdomen - soft, nontender, nondistended, no masses or organomegaly  Pelvic -  VULVA: normal appearing vulva with no masses, tenderness or lesions   VAGINA: normal appearing vagina with normal color and discharge, no lesions   CERVIX: normal appearing cervix without discharge or lesions, no CMT  Thin prep pap is done with HR HPV cotesting  UTERUS: uterus is felt to be normal size, shape, consistency and nontender   Tender bilateral levator ani muscles  Extremities:  No swelling or varicosities noted  Chaperone present for exam  No results found for this or any previous visit (from the past 24 hours).    Assessment & Plan:  Assessment and Plan Assessment & Plan Chronic pelvic pain with dyspareunia and suspected endometriosis Chronic pelvic pain with dyspareunia likely due to suspected endometriosis. Painful periods and pelvic floor dysfunction may contribute. IUD provides symptomatic relief. - Refer to pelvic floor physical therapy with Dr. Elnor. - Encourage continuation of pelvic floor exercises at home. - Can use water to rinse vagina of old blood or prn vaginal boric acid for isolated changes in vaginal odor  Menstrual irregularity associated with IUD Menstrual irregularity likely due to IUD use. IUD retained for symptomatic relief from painful periods and management of suspected endometriosis. - Continue with current IUD.  Sexually transmitted infection screening Requested STI testing. Blood tests for HIV, syphilis, hepatitis, and vaginal tests for gonorrhea, chlamydia, and trichomoniasis discussed and agreed upon. - Perform blood tests for HIV, syphilis, hepatitis. - Perform  vaginal tests for gonorrhea, chlamydia, trichomoniasis.  Well Woman Exam - Cervical cancer screening: Discussed screening Q3 years. Reviewed importance of annual exams and limits of pap smear. Pap with reflex HPV collected - GC/CT: Discussed and recommended. Pt  accepts - Gardasil: completed - Birth Control: IUD - Breast Health: Encouraged self breast awareness/exams.  - Follow-up: 12 months and prn     No orders of the defined types were placed in this encounter.   Meds: No orders of the defined types were placed in this encounter.   Follow-up: No follow-ups on file.  Carter Quarry, MD 03/19/2024 9:49 AM

## 2024-03-20 LAB — RPR: RPR Ser Ql: NONREACTIVE

## 2024-03-20 LAB — HEPATITIS C ANTIBODY: Hep C Virus Ab: NONREACTIVE

## 2024-03-20 LAB — HIV ANTIBODY (ROUTINE TESTING W REFLEX): HIV Screen 4th Generation wRfx: NONREACTIVE

## 2024-03-20 LAB — HEPATITIS B SURFACE ANTIGEN: Hepatitis B Surface Ag: NEGATIVE

## 2024-03-22 ENCOUNTER — Ambulatory Visit: Payer: Self-pay | Admitting: Obstetrics and Gynecology

## 2024-03-22 LAB — CERVICOVAGINAL ANCILLARY ONLY
Chlamydia: NEGATIVE
Comment: NEGATIVE
Comment: NEGATIVE
Comment: NORMAL
Neisseria Gonorrhea: NEGATIVE
Trichomonas: NEGATIVE

## 2024-03-24 LAB — CYTOLOGY - PAP
Diagnosis: NEGATIVE
Diagnosis: REACTIVE

## 2024-05-27 NOTE — Therapy (Deleted)
 OUTPATIENT PHYSICAL THERAPY FEMALE PELVIC EVALUATION   Patient Name: Rose Schwartz MRN: [id number] DOB:[birth year], 23 y.o., female Today's Date: 05/27/2024  END OF SESSION:   Past Medical History:  Diagnosis Date   Anemia    Asthma    Bipolar 1 disorder (HCC)    Frequent urination 10/11/2020   UTI (urinary tract infection)    Past Surgical History:  Procedure Laterality Date   CYST EXCISION Left 03/21/2020   Procedure: Left wrist ganglion cyst excision;  Surgeon: Elisabeth Craig RAMAN, MD;  Location: [facility];  Service: Plastics;  Laterality: Left;  total case time is 1 hour   FETAL AMNIOTIC BAND RELEASE     TRIGGER FINGER RELEASE Left 03/21/2020   Procedure: first dorsal extensor compartment release;  Surgeon: Elisabeth Craig RAMAN, MD;  Location: [facility];  Service: Plastics;  Laterality: Left;   Patient Active Problem List   Diagnosis Date Noted   Chronic headaches 12/18/2020   Chest pain 12/18/2020   Eczema 10/11/2020   Encounter to establish care with new doctor 06/23/2020   Alpha thalassemia silent carrier 08/04/2019   Asthma    Bipolar 1 disorder (HCC)     PCP: Rosalea Rosina SAILOR, PA  REFERRING PROVIDER: Ajewole, Christana, MD   REFERRING DIAG:  R10.2,G89.29 (ICD-10-CM) - Chronic pelvic pain in female  N94.10 (ICD-10-CM) - Dyspareunia in female    THERAPY DIAG:  No diagnosis found.  Rationale for Evaluation and Treatment: Rehabilitation  ONSET DATE: ***  SUBJECTIVE:                                                                                                                                                                                           SUBJECTIVE STATEMENT: *** Fluid intake:   FUNCTIONAL LIMITATIONS: ***  PERTINENT HISTORY:  Medications for current condition: *** Surgeries: see above Other: see above Sexual abuse: {Yes/No:304960894}  PAIN:  Are you having pain? {yes/no:20286} NPRS scale: ***/10 Pain  location: {pelvic pain location:27098}  Pain type: {type:313116} Pain description: {PAIN DESCRIPTION:21022940}   Aggravating factors: *** Relieving factors: ***  PRECAUTIONS: {Therapy precautions:24002}  RED FLAGS: {PT Red Flags:29287}   WEIGHT BEARING RESTRICTIONS: No  FALLS:  Has patient fallen in last 6 months? {fallsyesno:27318}  OCCUPATION: ***  ACTIVITY LEVEL : ***  PLOF: {PLOF:24004}  PATIENT GOALS: ***   BOWEL MOVEMENT: Pain with bowel movement: {yes/no:20286} Type of bowel movement:{PT BM type:27100} Fully empty rectum: {No/Yes:304960894} Leakage: {Yes/No:304960894}  Caused by: *** Bowel urgency: *** Pads: {Yes/No:304960894} Fiber supplement/laxative {YES/NO AS:20300}  URINATION: Pain with urination: {yes/no:20286} Fully empty bladder: {Yes/No:304960894}***                                         Post-void dribble: {YES/NO AS:20300} Stream: {PT urination:27102} Urgency: {YES/NO AS:20300} Frequency:during the day ***                                                        Nocturia: {Yes/No:304960894}***   Leakage: {PT leakage:27103} Pads/briefs: {Yes/No:304960894}  INTERCOURSE:  Ability to have vaginal penetration {YES/NO:21197} Pain with intercourse: {pain with intercourse PA:27099} Dryness: {YES/NO AS:20300} Climax: *** Marinoff Scale: ***/3 Lubricant:  PREGNANCY: Vaginal deliveries *** Tearing {Yes***/No:304960894} Episiotomy {YES/NO AS:20300} C-section deliveries *** Currently pregnant {Yes***/No:304960894}  PROLAPSE: {PT prolapse:27101}   OBJECTIVE:  Note: Objective measures were completed at Evaluation unless otherwise noted.  DIAGNOSTIC FINDINGS:  Post-void residual: Voiding Cystourethrogram (VCUG):  Ultrasound: ***  PATIENT SURVEYS:  {rehab surveys:24030}  PFIQ-7: *** UIQ-7 *** CRAIG -7 *** POPIQ-7 *** Female Sexual Function Index (FSFI) Questionnaire  ***  COGNITION: Overall cognitive status: {cognition:24006}     SENSATION: Light touch: {intact/deficits:24005}  LUMBAR SPECIAL TESTS:  {lumbar special test:25242}  FUNCTIONAL TESTS:  {Functional tests:24029} Single leg stance:  Rt:  Lt: Sit-up test: Squat: Bed mobility:  GAIT: Assistive device utilized: {Assistive devices:23999} Comments: ***  POSTURE: {posture:25561}   LUMBARAROM/PROM:  A/PROM A/PROM  Eval (% available)  Flexion   Extension   Right lateral flexion   Left lateral flexion   Right rotation   Left rotation    (Blank rows = not tested)  LOWER EXTREMITY ROM:  {AROM/PROM:27142} ROM Right eval Left eval  Hip flexion    Hip extension    Hip abduction    Hip adduction    Hip internal rotation    Hip external rotation    Knee flexion    Knee extension    Ankle dorsiflexion    Ankle plantarflexion    Ankle inversion    Ankle eversion     (Blank rows = not tested)  LOWER EXTREMITY MMT:  MMT Right eval Left eval  Hip flexion    Hip extension    Hip abduction    Hip adduction    Hip internal rotation    Hip external rotation    Knee flexion    Knee extension    Ankle dorsiflexion    Ankle plantarflexion    Ankle inversion    Ankle eversion     (Blank rows = not tested) PALPATION:  General: ***  Pelvic Alignment: ***  Abdominal: ***  Diastasis: {Yes/No:304960894}*** Distortion: {YES/NO AS:20300}  Breathing: *** Scar tissue: {Yes/No:304960894}*** Active Straight Leg Raise: ***                External Perineal Exam: ***                             Internal Pelvic Floor: ***  Patient confirms identification and approves PT to assess internal pelvic floor and treatment {yes/no:20286} All internal or external pelvic floor assessments and/or treatments are completed with proper hand hygiene and gloves hands. If  needed gloves are changed with hand hygiene during patient care time.  PELVIC MMT:   MMT eval  Vaginal    Internal Anal Sphincter   External Anal Sphincter   Puborectalis   (Blank rows = not tested)        TONE: ***  PROLAPSE: ***  TODAY'S TREATMENT:                                                                                                                              DATE: ***  EVAL ***   PATIENT EDUCATION:  Education details: *** Person educated: {Person educated:25204} Education method: {Education Method:25205} Education comprehension: {Education Comprehension:25206}  HOME EXERCISE PROGRAM: ***  ASSESSMENT:  CLINICAL IMPRESSION: Patient is a *** y.o. *** who was seen today for physical therapy evaluation and treatment for ***.   OBJECTIVE IMPAIRMENTS: {opptimpairments:25111}.   ACTIVITY LIMITATIONS: {activitylimitations:27494}  PARTICIPATION LIMITATIONS: {participationrestrictions:25113}  PERSONAL FACTORS: {Personal factors:25162} are also affecting patient's functional outcome.   REHAB POTENTIAL: {rehabpotential:25112}  CLINICAL DECISION MAKING: {clinical decision making:25114}  EVALUATION COMPLEXITY: {Evaluation complexity:25115}   GOALS: Goals reviewed with patient? {yes/no:20286}  SHORT TERM GOALS: Target date: ***  *** Baseline: Goal status: INITIAL  2.  *** Baseline:  Goal status: INITIAL  3.  *** Baseline:  Goal status: INITIAL  4.  *** Baseline:  Goal status: INITIAL  5.  *** Baseline:  Goal status: INITIAL  6.  *** Baseline:  Goal status: INITIAL  LONG TERM GOALS: Target date: ***  *** Baseline:  Goal status: INITIAL  2.  *** Baseline:  Goal status: INITIAL  3.  *** Baseline:  Goal status: INITIAL  4.  *** Baseline:  Goal status: INITIAL  5.  *** Baseline:  Goal status: INITIAL  6.  *** Baseline:  Goal status: INITIAL  PLAN:  PT FREQUENCY: {rehab frequency:25116}  PT DURATION: {rehab duration:25117}  PLANNED INTERVENTIONS: {rehab planned interventions:25118::97110-Therapeutic exercises,97530-  Therapeutic [phone]- Neuromuscular re-education,97535- Self Rjmz,02859- Manual therapy,Patient/Family education}  PLAN FOR NEXT SESSION: ***   Emri Sample, PT 05/27/2024, 10:02 AM

## 2024-05-28 ENCOUNTER — Ambulatory Visit: Payer: MEDICAID | Attending: Obstetrics and Gynecology | Admitting: Physical Therapy

## 2024-09-02 ENCOUNTER — Ambulatory Visit: Payer: MEDICAID | Admitting: Neurology
